# Patient Record
Sex: Female | Born: 1945 | Race: Black or African American | Hispanic: No | Marital: Single | Smoking: Current some day smoker
Health system: Southern US, Community
[De-identification: ages and names within clinical notes are randomized; demographics above are authoritative.]

## PROBLEM LIST (undated history)

## (undated) DIAGNOSIS — I10 Essential (primary) hypertension: Secondary | ICD-10-CM

## (undated) DIAGNOSIS — Z720 Tobacco use: Secondary | ICD-10-CM

## (undated) DIAGNOSIS — I2510 Atherosclerotic heart disease of native coronary artery without angina pectoris: Secondary | ICD-10-CM

## (undated) DIAGNOSIS — I5022 Chronic systolic (congestive) heart failure: Secondary | ICD-10-CM

## (undated) DIAGNOSIS — I255 Ischemic cardiomyopathy: Secondary | ICD-10-CM

## (undated) DIAGNOSIS — I340 Nonrheumatic mitral (valve) insufficiency: Secondary | ICD-10-CM

## (undated) DIAGNOSIS — I447 Left bundle-branch block, unspecified: Secondary | ICD-10-CM

## (undated) DIAGNOSIS — I4901 Ventricular fibrillation: Secondary | ICD-10-CM

## (undated) HISTORY — DX: Essential (primary) hypertension: I10

## (undated) HISTORY — PX: TUBAL LIGATION: SHX77

## (undated) HISTORY — DX: Chronic systolic (congestive) heart failure: I50.22

## (undated) HISTORY — DX: Atherosclerotic heart disease of native coronary artery without angina pectoris: I25.10

## (undated) HISTORY — DX: Tobacco use: Z72.0

## (undated) HISTORY — DX: Nonrheumatic mitral (valve) insufficiency: I34.0

## (undated) HISTORY — DX: Left bundle-branch block, unspecified: I44.7

## (undated) HISTORY — DX: Ischemic cardiomyopathy: I25.5

## (undated) HISTORY — PX: TONSILLECTOMY AND ADENOIDECTOMY: SUR1326

---

## 2008-05-27 HISTORY — PX: CARDIAC CATHETERIZATION: SHX172

## 2009-06-14 HISTORY — PX: CARDIAC DEFIBRILLATOR PLACEMENT: SHX171

## 2011-02-11 ENCOUNTER — Encounter: Payer: Self-pay | Admitting: *Deleted

## 2011-02-11 ENCOUNTER — Ambulatory Visit (INDEPENDENT_AMBULATORY_CARE_PROVIDER_SITE_OTHER): Payer: Self-pay | Admitting: Cardiology

## 2011-02-11 ENCOUNTER — Encounter: Payer: Self-pay | Admitting: Cardiology

## 2011-02-11 VITALS — BP 110/77 | HR 69 | Ht 63.0 in | Wt 162.0 lb

## 2011-02-11 DIAGNOSIS — I5022 Chronic systolic (congestive) heart failure: Secondary | ICD-10-CM | POA: Insufficient documentation

## 2011-02-11 DIAGNOSIS — I509 Heart failure, unspecified: Secondary | ICD-10-CM

## 2011-02-11 DIAGNOSIS — E785 Hyperlipidemia, unspecified: Secondary | ICD-10-CM | POA: Insufficient documentation

## 2011-02-11 DIAGNOSIS — Z720 Tobacco use: Secondary | ICD-10-CM

## 2011-02-11 DIAGNOSIS — I2510 Atherosclerotic heart disease of native coronary artery without angina pectoris: Secondary | ICD-10-CM | POA: Insufficient documentation

## 2011-02-11 DIAGNOSIS — I10 Essential (primary) hypertension: Secondary | ICD-10-CM | POA: Insufficient documentation

## 2011-02-11 DIAGNOSIS — F17200 Nicotine dependence, unspecified, uncomplicated: Secondary | ICD-10-CM

## 2011-02-11 NOTE — Progress Notes (Signed)
HPI The patient presents for evaluation of CAD and ischemic cardiomyopathy.  She is new to this office.  He has a history of myocardial infarction with stent placement at [place] in 2010. This was in August. Apparently in December she was hospitalized again for congestive heart failure. She subsequently had an ICD in 2011. She reports an ejection fraction of 35% with her last echo being this year. I have none of the office records.  She is relocating to this area. She was active participating in cardiac rehabilitation. The patient denies any new symptoms such as chest discomfort, neck or arm discomfort. There has been no new shortness of breath, PND or orthopnea. There have been no reported palpitations, presyncope or syncope.  Allergies  Allergen Reactions  . Penicillins     Current Outpatient Prescriptions  Medication Sig Dispense Refill  . aspirin 81 MG tablet Take 81 mg by mouth daily.        . clopidogrel (PLAVIX) 75 MG tablet Take 75 mg by mouth daily.        . famotidine (PEPCID) 20 MG tablet Take 20 mg by mouth daily.        Marland Kitchen losartan (COZAAR) 50 MG tablet Take 50 mg by mouth daily.        . metoprolol succinate (TOPROL-XL) 25 MG 24 hr tablet Take 25 mg by mouth daily.        . simvastatin (ZOCOR) 40 MG tablet Take 40 mg by mouth at bedtime.        Marland Kitchen spironolactone (ALDACTONE) 25 MG tablet Take 25 mg by mouth daily.          Past Medical History  Diagnosis Date  . CHF (congestive heart failure)   . Hypertension   . Coronary atherosclerosis of unspecified type of bypass graft   . Coronary artery disease     stent placement 2010    Past Surgical History  Procedure Date  . Insert / replace / remove pacemaker 2011  . Cardiac defibrillator placement 2011    Family History  Problem Relation Age of Onset  . Diabetes type II Mother   . Heart attack Brother     cardiac arrest  . Seizures Sister   . Other Father     accident  . Other Brother     pacemaker    History    Social History  . Marital Status: Single    Spouse Name: N/A    Number of Children: N/A  . Years of Education: N/A   Occupational History  . RETIRED     retired bus Office manager office   Social History Main Topics  . Smoking status: Current Some Day Smoker -- 2.0 packs/day for 40 years    Types: Cigarettes  . Smokeless tobacco: Never Used   Comment: smokes socially/ smokes 2 cigarettes per month  . Alcohol Use: Yes     occasional alcohol use  . Drug Use: No  . Sexually Active: Not on file   Other Topics Concern  . Not on file   Social History Narrative  . No narrative on file    ROS:  As stated in the HPI and negative for all other systems.   PHYSICAL EXAM BP 110/77  Pulse 69  Ht 5\' 3"  (1.6 m)  Wt 162 lb (73.483 kg)  BMI 28.70 kg/m2 GENERAL:  Well appearing HEENT:  Pupils equal round and reactive, fundi not visualized, oral mucosa unremarkable NECK:  No jugular venous distention, waveform  within normal limits, carotid upstroke brisk and symmetric, no bruits, no thyromegaly LYMPHATICS:  No cervical, inguinal adenopathy LUNGS:  Clear to auscultation bilaterally BACK:  No CVA tenderness CHEST:  ICD pocket HEART:  PMI not displaced or sustained,S1 and S2 within normal limits, no S3, no S4, no clicks, no rubs, no murmurs ABD:  Flat, positive bowel sounds normal in frequency in pitch, no bruits, no rebound, no guarding, no midline pulsatile mass, no hepatomegaly, no splenomegaly EXT:  2 plus pulses upper and diminished lower, no edema, no cyanosis no clubbing SKIN:  No rashes no nodules NEURO:  Cranial nerves II through XII grossly intact, motor grossly intact throughout PSYCH:  Cognitively intact, oriented to person place and time  EKG:    AV paced rhythm  ASSESSMENT AND PLAN

## 2011-02-11 NOTE — Assessment & Plan Note (Signed)
She seems to be euvolemic.  At this point, no change in therapy is indicated.  We have reviewed salt and fluid restrictions.  No further cardiovascular testing is indicated.  I will arrange for her to follow in our device clinic.

## 2011-02-11 NOTE — Patient Instructions (Signed)
Your physician wants you to follow-up in: 4 months. You will receive a reminder letter in the mail one-two months in advance. If you don't receive a letter, please call our office to schedule the follow-up appointment. Referral to Cardiac Rehab Referral to EP at [facility]. Office. We will obtain your prior cardiac records.

## 2011-02-11 NOTE — Assessment & Plan Note (Signed)
I will defer to VYAS,DHRUV B., MD, MD.  She had her lipids checked recently but I don't have these results.  I would suggest an aggressive goal of LDL less than 70 and HDL greater than 50.

## 2011-02-11 NOTE — Assessment & Plan Note (Signed)
At this point she has no new symptoms.  She will continue with risk reduction and I will obtain the old records.

## 2011-02-11 NOTE — Assessment & Plan Note (Signed)
We had a long discussion about the need to avoid all cigarettes.

## 2011-02-11 NOTE — Assessment & Plan Note (Signed)
The blood pressure is at target. No change in medications is indicated. We will continue with therapeutic lifestyle changes (TLC).  

## 2011-03-25 ENCOUNTER — Ambulatory Visit: Payer: Self-pay | Admitting: Internal Medicine

## 2011-04-11 ENCOUNTER — Encounter: Payer: Self-pay | Admitting: Internal Medicine

## 2011-04-11 ENCOUNTER — Ambulatory Visit (INDEPENDENT_AMBULATORY_CARE_PROVIDER_SITE_OTHER): Payer: Medicare HMO | Admitting: Internal Medicine

## 2011-04-11 DIAGNOSIS — I509 Heart failure, unspecified: Secondary | ICD-10-CM

## 2011-04-11 DIAGNOSIS — Z720 Tobacco use: Secondary | ICD-10-CM

## 2011-04-11 DIAGNOSIS — I428 Other cardiomyopathies: Secondary | ICD-10-CM

## 2011-04-11 DIAGNOSIS — F17200 Nicotine dependence, unspecified, uncomplicated: Secondary | ICD-10-CM

## 2011-04-11 DIAGNOSIS — I2510 Atherosclerotic heart disease of native coronary artery without angina pectoris: Secondary | ICD-10-CM

## 2011-04-11 DIAGNOSIS — I5022 Chronic systolic (congestive) heart failure: Secondary | ICD-10-CM

## 2011-04-11 DIAGNOSIS — I10 Essential (primary) hypertension: Secondary | ICD-10-CM

## 2011-04-11 NOTE — Progress Notes (Signed)
Tracy Mills is a pleasant 65 y.o. patient with a h/o CAD, ischemic CM, sp ICD implantation (MDT) in [place] who presents today to establish care in the Electrophysiology device clinic.  She reports having MI 2010 for which she underwent PCI.  She developed subsequent CHF.  She underwent ICD implantation 2011.  The patient reports doing very well recently and remains very active despite her age.  She denies problems with her ICD and has not received ICD shocks.  Today, she  denies symptoms of palpitations, chest pain, shortness of breath, orthopnea, PND, lower extremity edema, dizziness, presyncope, syncope, or neurologic sequela.  The patientis tolerating medications without difficulties and is otherwise without complaint today.   Past Medical History  Diagnosis Date  . Chronic systolic congestive heart failure   . Hypertension   . Coronary artery disease     stent placement 2010  . Tobacco abuse     Past Surgical History  Procedure Date  . Cardiac defibrillator placement 2011  . Tonsillectomy and adenoidectomy   . Tubal ligation     History   Social History  . Marital Status: Single    Spouse Name: N/A    Number of Children: N/A  . Years of Education: N/A   Occupational History  . RETIRED     retired bus Office manager office   Social History Main Topics  . Smoking status: Current Some Day Smoker -- 0.2 packs/day for 40 years    Types: Cigarettes  . Smokeless tobacco: Never Used   Comment: smokes socially/ smokes 2 cigarettes per month, trying to quit  . Alcohol Use: Yes     occasional alcohol use  . Drug Use: No  . Sexually Active: Not on file   Other Topics Concern  . Not on file   Social History Narrative   Moved to [place] from [place].  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 Diabetes type II Mother   . Heart attack Brother 51    cardiac arrest  .  Seizures Sister   . Other Father     accident  . Other Brother     pacemaker    Allergies  Allergen Reactions  . Penicillins     Current Outpatient Prescriptions  Medication Sig Dispense Refill  . aspirin 81 MG tablet Take 81 mg by mouth daily.        . clopidogrel (PLAVIX) 75 MG tablet Take 75 mg by mouth daily.        Marland Kitchen losartan (COZAAR) 50 MG tablet Take 50 mg by mouth daily.        . metoprolol succinate (TOPROL-XL) 25 MG 24 hr tablet Take 25 mg by mouth daily.        . simvastatin (ZOCOR) 40 MG tablet Take 40 mg by mouth at bedtime.        Marland Kitchen spironolactone (ALDACTONE) 25 MG tablet Take 25 mg by mouth daily.          ROS- all systems are reviewed and negative except as per HPI  Physical Exam: Filed Vitals:   04/11/11 1356 04/11/11 1359  BP: 129/86 127/87  Pulse: 67 69  Height: 5\' 3"  (1.6 m)   Weight: 162 lb (73.483 kg)     GEN- The patient is overweight appearing, alert and oriented x 3 today.   Head- normocephalic, atraumatic Eyes-  Sclera clear, conjunctiva pink Ears- hearing intact Oropharynx-  clear Neck- supple, no JVP Lymph- no cervical lymphadenopathy Lungs- Clear to ausculation bilaterally, normal work of breathing Chest- ICD pocket is well healed Heart- Regular rate and rhythm, no murmurs, rubs or gallops, PMI not laterally displaced GI- soft, NT, ND, + BS Extremities- no clubbing, cyanosis, or edema MS- no significant deformity or atrophy Skin- no rash or lesion Psych- euthymic mood, full affect Neuro- strength and sensation are intact  ICD interrogation- reviewed in detail today,  See PACEART report  Assessment and Plan:

## 2011-04-11 NOTE — Assessment & Plan Note (Signed)
Stable No change required today  

## 2011-04-11 NOTE — Assessment & Plan Note (Signed)
Cessation advised She is trying to quit

## 2011-04-11 NOTE — Assessment & Plan Note (Addendum)
Normal ICD function See Pace Art report VT zone increased to 170 bpm, VF zone increased to 200bpm (NID 30/40)  carelink every 3 months Return in 12 months

## 2011-05-28 DIAGNOSIS — Z8679 Personal history of other diseases of the circulatory system: Secondary | ICD-10-CM

## 2011-05-28 HISTORY — DX: Personal history of other diseases of the circulatory system: Z86.79

## 2011-06-24 ENCOUNTER — Ambulatory Visit (INDEPENDENT_AMBULATORY_CARE_PROVIDER_SITE_OTHER): Payer: Medicare HMO | Admitting: Cardiovascular Disease

## 2011-06-24 ENCOUNTER — Encounter: Payer: Self-pay | Admitting: Cardiovascular Disease

## 2011-06-24 DIAGNOSIS — I5022 Chronic systolic (congestive) heart failure: Secondary | ICD-10-CM

## 2011-06-24 DIAGNOSIS — I5020 Unspecified systolic (congestive) heart failure: Secondary | ICD-10-CM

## 2011-06-24 DIAGNOSIS — I2510 Atherosclerotic heart disease of native coronary artery without angina pectoris: Secondary | ICD-10-CM

## 2011-06-24 DIAGNOSIS — I340 Nonrheumatic mitral (valve) insufficiency: Secondary | ICD-10-CM

## 2011-06-24 DIAGNOSIS — I509 Heart failure, unspecified: Secondary | ICD-10-CM

## 2011-06-24 DIAGNOSIS — I059 Rheumatic mitral valve disease, unspecified: Secondary | ICD-10-CM

## 2011-06-24 MED ORDER — CLOPIDOGREL BISULFATE 75 MG PO TABS: 75.0000 mg | ORAL_TABLET | Freq: Every day | ORAL | Status: DC

## 2011-06-24 MED ORDER — LOSARTAN POTASSIUM 50 MG PO TABS: 50.0000 mg | ORAL_TABLET | Freq: Every day | ORAL | Status: DC

## 2011-06-24 MED ORDER — METOPROLOL SUCCINATE ER 25 MG PO TB24: 25.0000 mg | ORAL_TABLET | Freq: Every day | ORAL | Status: DC

## 2011-06-24 MED ORDER — SPIRONOLACTONE 25 MG PO TABS: 25.0000 mg | ORAL_TABLET | Freq: Every day | ORAL | Status: DC

## 2011-06-24 MED ORDER — SIMVASTATIN 40 MG PO TABS: 40.0000 mg | ORAL_TABLET | Freq: Every day | ORAL | Status: DC

## 2011-06-24 NOTE — Patient Instructions (Signed)
Your physician wants you to follow-up in: 6 months. You will receive a reminder letter in the mail one-two months in advance. If you don't receive a letter, please call our office to schedule the follow-up appointment. Your physician recommends that you continue on your current medications as directed. Please refer to the Current Medication list given to you today. Your physician has requested that you have an echocardiogram. Echocardiography is a painless test that uses sound waves to create images of your heart. It provides your doctor with information about the size and shape of your heart and how well your heart's chambers and valves are working. This procedure takes approximately one hour. There are no restrictions for this procedure. If the results of your test are normal or stable, you will receive a letter. If they are abnormal, the nurse will contact you by phone.  

## 2011-06-24 NOTE — Progress Notes (Signed)
HPI  This is a 66 year old female who is here today for a followup visit. She has history of anterior myocardial infarction in 2010 status post LAD PCI and stent placement. She has chronic systolic heart failure with severely reduced LV systolic function. She is status post ICD placement. Most recent echocardiogram was in January of 2012 which showed an ejection fraction of 20% with moderate to severe mitral regurgitation. She had a stress test done in June of 2012 which showed a large anterior infarct without significant ischemia. Overall, she feels reasonably well. She denies any chest pain. She has mild exertional dyspnea which has not worsened. She denies any orthopnea or PND. She has not been taking spironolactone regularly.  Allergies  Allergen Reactions  . Penicillins      Current Outpatient Prescriptions on File Prior to Visit  Medication Sig Dispense Refill  . aspirin 81 MG tablet Take 81 mg by mouth daily.        . clopidogrel (PLAVIX) 75 MG tablet Take 75 mg by mouth daily.        Marland Kitchen losartan (COZAAR) 50 MG tablet Take 50 mg by mouth daily.        . metoprolol succinate (TOPROL-XL) 25 MG 24 hr tablet Take 25 mg by mouth daily.        . simvastatin (ZOCOR) 40 MG tablet Take 40 mg by mouth at bedtime.        Marland Kitchen spironolactone (ALDACTONE) 25 MG tablet Take 25 mg by mouth daily.           Past Medical History  Diagnosis Date  . Chronic systolic congestive heart failure   . Hypertension   . Tobacco abuse   . Coronary artery disease     anterior MI in 2010. LAD stent placement 2010. EF: 15%  . MR (mitral regurgitation)     moderate to severe     Past Surgical History  Procedure Date  . Cardiac defibrillator placement 2011  . Tonsillectomy and adenoidectomy   . Tubal ligation   . Cardiac catheterization 2010    LAD PCI and stent placement     Family History  Problem Relation Age of Onset  . Diabetes type II Mother   . Heart attack Brother 51    cardiac arrest  .  Seizures Sister   . Other Father     accident  . Other Brother     pacemaker     History   Social History  . Marital Status: Single    Spouse Name: N/A    Number of Children: N/A  . Years of Education: N/A   Occupational History  . RETIRED     retired bus Office manager office   Social History Main Topics  . Smoking status: Current Some Day Smoker -- 0.2 packs/day for 40 years    Types: Cigarettes  . Smokeless tobacco: Never Used   Comment: smokes socially/ smokes 2 cigarettes per month, trying to quit  . Alcohol Use: Yes     occasional alcohol use  . Drug Use: No  . Sexually Active: Not on file   Other Topics Concern  . Not on file   Social History Narrative   Moved to [place] from [place].  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 She appears well-developed and well-nourished. No distress.  HENT: No nasal discharge.  Head: Normocephalic and atraumatic.  Eyes: Pupils are equal, round, and reactive to light. Right eye exhibits no discharge. Left eye exhibits no discharge.  Neck: Normal range of motion. Neck supple. No JVD present. No thyromegaly present.  Cardiovascular: Normal rate, regular rhythm, normal heart sounds. Exam reveals no gallop and no friction rub.  No murmur heard.  Pulmonary/Chest: Effort normal and breath sounds normal. No stridor. No respiratory distress. She has no wheezes. She has no rales. She exhibits no tenderness.  Abdominal: Soft. Bowel sounds are normal. She exhibits no distension. There is no tenderness. There is no rebound and no guarding.  Musculoskeletal: Normal range of motion. She exhibits no edema and no tenderness.  Neurological: She is alert and oriented to person, place, and time. Coordination normal.  Skin: Skin is warm  and dry. No rash noted. She is not diaphoretic. No erythema. No pallor.  Psychiatric: She has a normal mood and affect. Her behavior is normal. Judgment and thought content normal.     ASSESSMENT AND PLAN

## 2011-06-25 ENCOUNTER — Encounter: Payer: Self-pay | Admitting: Cardiovascular Disease

## 2011-06-25 NOTE — Assessment & Plan Note (Signed)
This will be evaluated with the echocardiogram.

## 2011-06-25 NOTE — Assessment & Plan Note (Signed)
She seems to be euvolemic at this time. I advised her to take spironolactone regularly to improve LV systolic function. She gets her routine labs done with Dr. Sherril Croon. I do recommend basic metabolic profile every 6 months. She is currently New York Heart Association class II-III.  I recommend repeat echocardiogram to reevaluate her LV systolic function and degree of mitral regurgitation. If her EF is still significantly depressed, I recommend switching Toprol to carvedilol.

## 2011-06-25 NOTE — Assessment & Plan Note (Signed)
The patient is not having any recurrent chest pain. Most recent stress test was in June of 2012 which showed no significant ischemia although there was a large infarct. I recommend continuing dual antiplatelet therapy for now. Plavix is optional at this time but given her severely reduced LV systolic function, I favor that she stays on this longer than the one year mandatory period.

## 2011-07-03 ENCOUNTER — Other Ambulatory Visit (INDEPENDENT_AMBULATORY_CARE_PROVIDER_SITE_OTHER): Payer: Medicare HMO | Admitting: *Deleted

## 2011-07-03 DIAGNOSIS — I059 Rheumatic mitral valve disease, unspecified: Secondary | ICD-10-CM

## 2011-07-03 DIAGNOSIS — I2589 Other forms of chronic ischemic heart disease: Secondary | ICD-10-CM

## 2011-07-03 DIAGNOSIS — I2510 Atherosclerotic heart disease of native coronary artery without angina pectoris: Secondary | ICD-10-CM

## 2011-07-04 ENCOUNTER — Telehealth: Payer: Self-pay | Admitting: *Deleted

## 2011-07-04 MED ORDER — CARVEDILOL 3.125 MG PO TABS: 3.1250 mg | ORAL_TABLET | Freq: Two times a day (BID) | ORAL | Status: DC

## 2011-07-04 NOTE — Telephone Encounter (Signed)
Message copied by Eustace Moore on Thu Jul 04, 2011  2:12 PM ------      Message from: Lorine Bears A      Created: Thu Jul 04, 2011 12:49 PM       Inform patient that echo showed that her heart function is still very weak. Stop Toprol. Start Coreg 3.125 mg bid #60 with 3 refills. Follow up in 1 month to see if we can gradually increase the dose.

## 2011-07-04 NOTE — Telephone Encounter (Signed)
Patient informed and verbalized understanding of plan. New prescription for coreg sent to [facility].

## 2011-07-11 ENCOUNTER — Ambulatory Visit (INDEPENDENT_AMBULATORY_CARE_PROVIDER_SITE_OTHER): Payer: Medicare HMO | Admitting: *Deleted

## 2011-07-11 ENCOUNTER — Encounter: Payer: Self-pay | Admitting: Internal Medicine

## 2011-07-11 DIAGNOSIS — I509 Heart failure, unspecified: Secondary | ICD-10-CM

## 2011-07-11 DIAGNOSIS — I5022 Chronic systolic (congestive) heart failure: Secondary | ICD-10-CM

## 2011-07-11 DIAGNOSIS — I428 Other cardiomyopathies: Secondary | ICD-10-CM

## 2011-07-14 LAB — REMOTE ICD DEVICE
AL AMPLITUDE: 1.8 mv
AL THRESHOLD: 0.5 V
BATTERY VOLTAGE: 3.1125 V
CHARGE TIME: 8.948 s
DEV-0020ICD: NEGATIVE
RV LEAD AMPLITUDE: 20 mv
RV LEAD IMPEDENCE ICD: 627 Ohm
TOT-0001: 1
TOT-0002: 0
TZAT-0001FASTVT: 1
TZAT-0001SLOWVT: 1
TZAT-0001SLOWVT: 2
TZAT-0002ATACH: NEGATIVE
TZAT-0002FASTVT: NEGATIVE
TZAT-0004SLOWVT: 8
TZAT-0004SLOWVT: 8
TZAT-0011ATACH: 10 ms
TZAT-0012ATACH: 150 ms
TZAT-0012FASTVT: 200 ms
TZAT-0013SLOWVT: 3
TZAT-0013SLOWVT: 3
TZAT-0018FASTVT: NEGATIVE
TZAT-0019ATACH: 6 V
TZAT-0019ATACH: 6 V
TZAT-0019FASTVT: 8 V
TZAT-0020ATACH: 1.5 ms
TZAT-0020ATACH: 1.5 ms
TZAT-0020ATACH: 1.5 ms
TZAT-0020SLOWVT: 1.5 ms
TZAT-0020SLOWVT: 1.5 ms
TZON-0003VSLOWVT: 400 ms
TZON-0004SLOWVT: 28
TZON-0004VSLOWVT: 36
TZST-0001ATACH: 5
TZST-0001FASTVT: 4
TZST-0001FASTVT: 5
TZST-0001FASTVT: 6
TZST-0001SLOWVT: 3
TZST-0001SLOWVT: 5
TZST-0002ATACH: NEGATIVE
TZST-0002ATACH: NEGATIVE
TZST-0002FASTVT: NEGATIVE
TZST-0002FASTVT: NEGATIVE
TZST-0003SLOWVT: 35 J

## 2011-07-16 ENCOUNTER — Encounter: Payer: Self-pay | Admitting: *Deleted

## 2011-07-19 NOTE — Progress Notes (Signed)
ICD remote with ICM 

## 2011-08-01 ENCOUNTER — Ambulatory Visit: Payer: Medicare HMO | Admitting: Cardiovascular Disease

## 2011-08-02 ENCOUNTER — Ambulatory Visit (INDEPENDENT_AMBULATORY_CARE_PROVIDER_SITE_OTHER): Payer: Medicare HMO | Admitting: Cardiovascular Disease

## 2011-08-02 ENCOUNTER — Encounter: Payer: Self-pay | Admitting: Cardiovascular Disease

## 2011-08-02 DIAGNOSIS — I5022 Chronic systolic (congestive) heart failure: Secondary | ICD-10-CM

## 2011-08-02 DIAGNOSIS — I2510 Atherosclerotic heart disease of native coronary artery without angina pectoris: Secondary | ICD-10-CM

## 2011-08-02 DIAGNOSIS — Z720 Tobacco use: Secondary | ICD-10-CM

## 2011-08-02 DIAGNOSIS — I340 Nonrheumatic mitral (valve) insufficiency: Secondary | ICD-10-CM

## 2011-08-02 DIAGNOSIS — I509 Heart failure, unspecified: Secondary | ICD-10-CM

## 2011-08-02 DIAGNOSIS — I059 Rheumatic mitral valve disease, unspecified: Secondary | ICD-10-CM

## 2011-08-02 DIAGNOSIS — F17200 Nicotine dependence, unspecified, uncomplicated: Secondary | ICD-10-CM

## 2011-08-02 MED ORDER — CARVEDILOL 6.25 MG PO TABS: 6.2500 mg | ORAL_TABLET | Freq: Two times a day (BID) | ORAL | Status: DC

## 2011-08-02 NOTE — Progress Notes (Signed)
HPI  This is a 66 year old female who is here today for a followup visit. She has history of anterior myocardial infarction in 2010 status post LAD PCI and stent placement. She has chronic systolic heart failure with severely reduced LV systolic function. She is status post ICD placement. She had a stress test done in June of 2012 which showed a large anterior infarct without significant ischemia. Overall, she feels reasonably well. She denies any chest pain. She has mild exertional dyspnea which has not worsened. She denies any orthopnea or PND. I obtain an echocardiogram last month which basically showed no significant improvement in her LV systolic function. Ejection fraction was 15-20% with moderate to severe mitral regurgitation. I elected to switch her from Toprol to carvedilol 3.125 mg twice daily. I also instructed her to take spironolactone on a regular basis and not just as needed.   Allergies  Allergen Reactions  . Penicillins   . Tetracyclines & Related      Current Outpatient Prescriptions on File Prior to Visit  Medication Sig Dispense Refill  . aspirin 81 MG tablet Take 81 mg by mouth daily.        . clopidogrel (PLAVIX) 75 MG tablet Take 1 tablet (75 mg total) by mouth daily.  30 tablet  6  . losartan (COZAAR) 50 MG tablet Take 1 tablet (50 mg total) by mouth daily.  30 tablet  6  . nitroGLYCERIN (NITROSTAT) 0.4 MG SL tablet Place 0.4 mg under the tongue every 5 (five) minutes as needed.      . simvastatin (ZOCOR) 40 MG tablet Take 1 tablet (40 mg total) by mouth at bedtime.  30 tablet  6  . spironolactone (ALDACTONE) 25 MG tablet Take 1 tablet (25 mg total) by mouth daily.  30 tablet  6     Past Medical History  Diagnosis Date  . Chronic systolic congestive heart failure   . Hypertension   . Tobacco abuse   . Coronary artery disease     anterior MI in 2010. LAD stent placement 2010. EF: 15%  . MR (mitral regurgitation)     moderate to severe     Past Surgical  History  Procedure Date  . Cardiac defibrillator placement 2011  . Tonsillectomy and adenoidectomy   . Tubal ligation   . Cardiac catheterization 2010    LAD PCI and stent placement     Family History  Problem Relation Age of Onset  . Diabetes type II Mother   . Heart attack Brother 51    cardiac arrest  . Seizures Sister   . Other Father     accident  . Other Brother     pacemaker     History   Social History  . Marital Status: Single    Spouse Name: N/A    Number of Children: N/A  . Years of Education: N/A   Occupational History  . RETIRED     retired bus Office manager office   Social History Main Topics  . Smoking status: Current Some Day Smoker -- 0.2 packs/day for 40 years    Types: Cigarettes  . Smokeless tobacco: Never Used   Comment: smokes socially/ smokes 2 cigarettes per month, trying to quit  . Alcohol Use: Yes     occasional alcohol use  . Drug Use: No  . Sexually Active: Not on file   Other Topics Concern  . Not on file   Social History Narrative   Moved to  [place] from [place].  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 She appears well-developed and well-nourished. No distress.  HENT: No nasal discharge.  Head: Normocephalic and atraumatic.  Eyes: Pupils are equal and round. Right eye exhibits no discharge. Left eye exhibits no discharge.  Neck: Normal range of motion. Neck supple. No JVD present. No thyromegaly present.  Cardiovascular: Normal rate, regular rhythm, normal heart sounds. Exam reveals no gallop and no friction rub. No murmur heard.  Pulmonary/Chest: Effort normal and breath sounds normal. No stridor. No respiratory distress. She has no wheezes. She has no rales. She exhibits no tenderness.  Abdominal: Soft. Bowel  sounds are normal. She exhibits no distension. There is no tenderness. There is no rebound and no guarding.  Musculoskeletal: Normal range of motion. She exhibits no edema and no tenderness.  Neurological: She is alert and oriented to person, place, and time. Coordination normal.  Skin: Skin is warm and dry. No rash noted. She is not diaphoretic. No erythema. No pallor.  Psychiatric: She has a normal mood and affect. Her behavior is normal. Judgment and thought content normal.      ASSESSMENT AND PLAN

## 2011-08-02 NOTE — Patient Instructions (Addendum)
   Follow up in 2 months.  Increase Coreg (carvedilol) to 6.25 mg two times a day. You may take 2 of your 3.125 mg tablets two times a day until gone, and then get new prescription filled for 6.25 mg tablets. A new prescription was sent to your pharmacy to reflect this change.

## 2011-08-02 NOTE — Assessment & Plan Note (Signed)
The patient is working hard to quit smoking. According to her she only smoked a few cigarettes over the last month.

## 2011-08-02 NOTE — Assessment & Plan Note (Signed)
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 No indication for mitral valve repair. I am hoping that this would improve if her LV remodeling improves. Also there are reports that CRT can actually improve the degree of regurgitation in the setting of severe LV systolic dysfunction and left ventricular dyssynchrony.

## 2011-08-02 NOTE — Assessment & Plan Note (Signed)
No recurrent angina. Continue medical therapy.  

## 2011-08-02 NOTE — Assessment & Plan Note (Signed)
She seems to be euvolemic at this time.  She is currently New York Heart Association class II-III.  Repeat echocardiogram recently showed persistent severe LV systolic dysfunction with an ejection fraction of 15-20%. This is not a good sign. Clinically she seems to be stable. She has been switched to Coreg since then and she seems to be tolerating it.  Today, I will go ahead and increase the dose to 6.25 mg twice daily. The plan is to try to up titrate this medication as much as tolerated by her blood pressure. This will be reevaluated in 2 months. Continue current heart failure therapy. She has not required diuretic. The patient is interested in stem cell therapy for heart failure. I am not aware of such trials. Most of stem cell therapy and heart failure is in the setting of acute infarct and usually within 7-14 days of the acute event. I will check with Dr. Gala Romney to see if there are any available trials locally. I think the other option we have to evaluate is upgrading his ICD to an ICD CRT device given that she has underlying left bundle branch block. I will be checking that with Dr. Johney Frame.

## 2011-08-05 ENCOUNTER — Telehealth: Payer: Self-pay | Admitting: *Deleted

## 2011-08-05 NOTE — Telephone Encounter (Signed)
Message copied by Arlyss Gandy on Mon Aug 05, 2011  2:46 PM ------      Message from: Lorine Bears A      Created: Mon Aug 05, 2011  1:10 PM       See below. Schedule her for a visit with Dr. Johney Frame here in [place].             ----- Message -----         From: Hillis Range, MD         Sent: 08/04/2011   8:27 PM           To: Iran Ouch, MD            She has a wide left bundle and does seem to be a reasonable candidate for CRT.  Her device was implanted 2011 in [place].  Have the nurses schedule her to see me again in the [place] office and we can discuss upgrade.            Thanks      james      ----- Message -----         From: Iran Ouch, MD         Sent: 08/02/2011   3:38 PM           To: Hillis Range, MD            Dan,       Are you aware of any stem-cell trials for heart failure. This patient is interested.       Fayrene Fearing,       What do you think about upgrading her ICD to ICD/CRT?

## 2011-08-05 NOTE — Telephone Encounter (Signed)
First available appt with Dr. Johney Frame in [place] is not until June. Spoke with Dr. Kirke Corin who recommends pt see Dr. Johney Frame in [place] if sooner appt available. Pt is agreeable w/this plan. Pt will see Dr. Johney Frame on 4/11 at 11:30. She is aware of this appt.

## 2011-09-05 ENCOUNTER — Encounter: Payer: Self-pay | Admitting: Internal Medicine

## 2011-09-05 ENCOUNTER — Ambulatory Visit (INDEPENDENT_AMBULATORY_CARE_PROVIDER_SITE_OTHER): Payer: Medicare HMO | Admitting: Internal Medicine

## 2011-09-05 VITALS — BP 103/69 | HR 64 | Resp 18 | Ht 62.0 in | Wt 158.0 lb

## 2011-09-05 DIAGNOSIS — I059 Rheumatic mitral valve disease, unspecified: Secondary | ICD-10-CM

## 2011-09-05 DIAGNOSIS — I509 Heart failure, unspecified: Secondary | ICD-10-CM

## 2011-09-05 DIAGNOSIS — I10 Essential (primary) hypertension: Secondary | ICD-10-CM

## 2011-09-05 DIAGNOSIS — I2510 Atherosclerotic heart disease of native coronary artery without angina pectoris: Secondary | ICD-10-CM

## 2011-09-05 DIAGNOSIS — I340 Nonrheumatic mitral (valve) insufficiency: Secondary | ICD-10-CM

## 2011-09-05 DIAGNOSIS — I5022 Chronic systolic (congestive) heart failure: Secondary | ICD-10-CM

## 2011-09-05 NOTE — Patient Instructions (Signed)
Your physician wants you to follow-up in:  Nov 2013 in [place] with Dr Johney Frame Bonita Quin will receive a reminder letter in the mail two months in advance. If you don't receive a letter, please call our office to schedule the follow-up appointment.  Remote monitoring is used to monitor your Pacemaker of ICD from home. This monitoring reduces the number of office visits required to check your device to one time per year. It allows [place] to keep an eye on the functioning of your device to ensure it is working properly. You are scheduled for a device check from home on 10/10/2011. You may send your transmission at any time that day. If you have a wireless device, the transmission will be sent automatically. After your physician reviews your transmission, you will receive a postcard with your next transmission date.

## 2011-09-05 NOTE — Assessment & Plan Note (Signed)
Stable No change required today  

## 2011-09-05 NOTE — Assessment & Plan Note (Signed)
The patient has an ischemic CM (EF 20%), NYHA Class II/III CHF, and LBBB.   She is referred today by Dr Kirke Corin for consideration of upgrade of her ICD to a BiV ICD.  Given her QRS >131msec with LBBB and NYHA Class III CHF, she has a class I indication for upgrade to CRT. Risks, benefits, alternatives to ICD upgrade to a biv ICD system were discussed in detail with the patient today. The patient  understands that the risks include but are not limited to bleeding, infection, pneumothorax, perforation, tamponade, vascular damage, renal failure, MI, stroke, death,and lead dislodgement. At this time, she is not ready to proceed with upgrade.  She wishes to think about this further.  She will contact my office should she decide to proceed with device upgrade in the future. I think that data has shown that this is a very good option and have strongly encouraged her to consider this. She will continue her current device follow-up in the interim.

## 2011-09-05 NOTE — Assessment & Plan Note (Signed)
I agree with Dr Kirke Corin that this could improve with CRT.

## 2011-09-05 NOTE — Assessment & Plan Note (Signed)
No ischemic symptoms 

## 2011-09-05 NOTE — Progress Notes (Signed)
Tracy Mills is a pleasant 66 y.o. patient with a h/o CAD, ischemic CM, sp ICD implantation (MDT) in [place] who presents today to establish care in the Electrophysiology device clinic.  She reports having MI 2010 for which she underwent PCI.  She developed subsequent CHF.  She underwent ICD implantation 2011.  The patient reports doing very well recently and remains very active despite her age.  She has NYHA Class II/III CHF.  She reports dyspnea with moderate activity as well as occasional PND.  She denies problems with her ICD and has not received ICD shocks.  Today, she  denies symptoms of palpitations, chest pain, shortness of breath, orthopnea, PND, lower extremity edema, dizziness, presyncope, syncope, or neurologic sequela.  The patientis tolerating medications without difficulties and is otherwise without complaint today.   Past Medical History  Diagnosis Date  . Chronic systolic congestive heart failure   . Hypertension   . Tobacco abuse   . Coronary artery disease     anterior MI in 2010. LAD stent placement 2010. EF: 15%  . MR (mitral regurgitation)     moderate to severe  . LBBB (left bundle branch block)     Past Surgical History  Procedure Date  . Cardiac defibrillator placement 2011  . Tonsillectomy and adenoidectomy   . Tubal ligation   . Cardiac catheterization 2010    LAD PCI and stent placement    History   Social History  . Marital Status: Single    Spouse Name: N/A    Number of Children: N/A  . Years of Education: N/A   Occupational History  . RETIRED     retired bus Office manager office   Social History Main Topics  . Smoking status: Former Smoker -- 0.2 packs/day for 40 years    Types: Cigarettes  . Smokeless tobacco: Never Used   Comment: smokes socially/ smokes 2 cigarettes per month, trying to quit  . Alcohol Use: Yes     occasional alcohol use  . Drug Use: No  . Sexually Active: Not on file   Other Topics  Concern  . Not on file   Social History Narrative   Moved to [place] from [place].  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 Diabetes type II Mother   . Heart attack Brother 51    cardiac arrest  . Seizures Sister   . Other Father     accident  . Other Brother     pacemaker    Allergies  Allergen Reactions  . Penicillins Hives  . Tetracyclines & Related     Current Outpatient Prescriptions  Medication Sig Dispense Refill  . aspirin 81 MG tablet Take 81 mg by mouth daily.        . carvedilol (COREG) 6.25 MG tablet Take 1 tablet (6.25 mg total) by mouth 2 (two) times daily.  60 tablet  4  . clopidogrel (PLAVIX) 75 MG tablet Take 1 tablet (75 mg total) by mouth daily.  30 tablet  6  . losartan (COZAAR) 50 MG tablet Take 1 tablet (50 mg total) by mouth daily.  30 tablet  6  . nitroGLYCERIN (NITROSTAT) 0.4 MG SL tablet Place 0.4 mg under the tongue every 5 (five) minutes as needed.      . simvastatin (ZOCOR) 40 MG tablet Take 1 tablet (40 mg total) by mouth at bedtime.  30 tablet  6  .  spironolactone (ALDACTONE) 25 MG tablet Take 1 tablet (25 mg total) by mouth daily.  30 tablet  6    ROS- all systems are reviewed and negative except as per HPI  Physical Exam: Filed Vitals:   09/05/11 1141  BP: 103/69  Pulse: 64  Resp: 18  Height: 5\' 2"  (1.575 m)  Weight: 158 lb (71.668 kg)    GEN- The patient is overweight appearing, alert and oriented x 3 today.   Head- normocephalic, atraumatic Eyes-  Sclera clear, conjunctiva pink Ears- hearing intact Oropharynx- clear Neck- supple, no JVP Lymph- no cervical lymphadenopathy Lungs- Clear to ausculation bilaterally, normal work of breathing Chest- ICD pocket is well healed Heart- Regular rate and rhythm, no murmurs, rubs or gallops, PMI not laterally displaced GI- soft, NT, ND, + BS Extremities- no clubbing, cyanosis, or edema MS- no significant deformity  or atrophy Skin- no rash or lesion Psych- euthymic mood, full affect Neuro- strength and sensation are intact  ICD interrogation- reviewed in detail today,  See PACEART report  Assessment and Plan:

## 2011-09-06 ENCOUNTER — Encounter: Payer: Self-pay | Admitting: Cardiology

## 2011-10-10 ENCOUNTER — Encounter: Payer: Medicare HMO | Admitting: *Deleted

## 2011-10-18 ENCOUNTER — Encounter: Payer: Self-pay | Admitting: *Deleted

## 2011-10-24 ENCOUNTER — Encounter: Payer: Self-pay | Admitting: Internal Medicine

## 2011-10-24 ENCOUNTER — Ambulatory Visit (INDEPENDENT_AMBULATORY_CARE_PROVIDER_SITE_OTHER): Payer: Medicare HMO | Admitting: *Deleted

## 2011-10-24 DIAGNOSIS — I5022 Chronic systolic (congestive) heart failure: Secondary | ICD-10-CM

## 2011-10-24 DIAGNOSIS — I509 Heart failure, unspecified: Secondary | ICD-10-CM

## 2011-10-25 LAB — REMOTE ICD DEVICE
AL IMPEDENCE ICD: 437 Ohm
AL THRESHOLD: 0.625 V
BATTERY VOLTAGE: 3.0852 V
DEV-0020ICD: NEGATIVE
RV LEAD AMPLITUDE: 29.5 mv
RV LEAD THRESHOLD: 1.25 V
TOT-0006: 20110119000000
TZAT-0001ATACH: 2
TZAT-0001ATACH: 3
TZAT-0001SLOWVT: 1
TZAT-0001SLOWVT: 2
TZAT-0002ATACH: NEGATIVE
TZAT-0004SLOWVT: 8
TZAT-0005SLOWVT: 84 pct
TZAT-0005SLOWVT: 91 pct
TZAT-0011SLOWVT: 10 ms
TZAT-0011SLOWVT: 10 ms
TZAT-0012ATACH: 150 ms
TZAT-0012ATACH: 150 ms
TZAT-0012FASTVT: 200 ms
TZAT-0018ATACH: NEGATIVE
TZAT-0018ATACH: NEGATIVE
TZAT-0018FASTVT: NEGATIVE
TZAT-0018SLOWVT: NEGATIVE
TZAT-0019ATACH: 6 V
TZAT-0019FASTVT: 8 V
TZAT-0020ATACH: 1.5 ms
TZAT-0020ATACH: 1.5 ms
TZAT-0020FASTVT: 1.5 ms
TZON-0004VSLOWVT: 36
TZST-0001ATACH: 4
TZST-0001ATACH: 6
TZST-0001FASTVT: 3
TZST-0001FASTVT: 5
TZST-0001FASTVT: 6
TZST-0001SLOWVT: 3
TZST-0001SLOWVT: 4
TZST-0001SLOWVT: 6
TZST-0002ATACH: NEGATIVE
TZST-0002ATACH: NEGATIVE
TZST-0002FASTVT: NEGATIVE
TZST-0002FASTVT: NEGATIVE
TZST-0002FASTVT: NEGATIVE
TZST-0003SLOWVT: 25 J
TZST-0003SLOWVT: 35 J
VENTRICULAR PACING ICD: 0.04 pct
VF: 1

## 2011-10-29 ENCOUNTER — Ambulatory Visit: Payer: Medicare HMO | Admitting: Cardiology

## 2011-11-06 ENCOUNTER — Encounter: Payer: Self-pay | Admitting: *Deleted

## 2011-11-12 ENCOUNTER — Ambulatory Visit (INDEPENDENT_AMBULATORY_CARE_PROVIDER_SITE_OTHER): Payer: Medicare HMO | Admitting: Cardiology

## 2011-11-12 ENCOUNTER — Encounter: Payer: Self-pay | Admitting: Cardiology

## 2011-11-12 VITALS — BP 109/71 | HR 65 | Ht 62.5 in | Wt 155.8 lb

## 2011-11-12 DIAGNOSIS — I2510 Atherosclerotic heart disease of native coronary artery without angina pectoris: Secondary | ICD-10-CM

## 2011-11-12 MED ORDER — NITROGLYCERIN 0.4 MG SL SUBL: 0.4000 mg | SUBLINGUAL_TABLET | SUBLINGUAL | Status: DC | PRN

## 2011-11-12 MED ORDER — CARVEDILOL 3.125 MG PO TABS: 3.1250 mg | ORAL_TABLET | Freq: Two times a day (BID) | ORAL | Status: DC

## 2011-11-12 NOTE — Patient Instructions (Addendum)
Your physician recommends that you schedule a follow-up appointment in:1 month with Dr. Antoine Poche.  Your physician has recommended you make the following change in your medication:increase carvedilol to 9.375 mg twice daily. Please take 3.125 mg twice daily in addition to your 6.25 mg twice daily. All other medications are still the same.

## 2011-11-12 NOTE — Assessment & Plan Note (Signed)
She seems to be euvolemic.  We have reviewed salt and fluid restrictions.  No further cardiovascular testing is indicated.  I will up titrate the carvedilol to 9.375 mg twice a day. She will continue to consider whether she wants upgrade to CRT.

## 2011-11-12 NOTE — Progress Notes (Signed)
   HPI The patient presents for evaluation of CAD and ischemic cardiomyopathy.  She was seen recently by Dr. Johney Frame to discuss a possible left ventricular lead and upgrade to CRT. However, she has not wanted to have this yet. She says she's actually breathing well. She reports being able to climb a flight of stairs with only mild dyspnea (Class II).  She denies PND or orthopnea. She's not reporting palpitations, presyncope or syncope. She has no chest pressure, neck or arm discomfort. She's had no weight gain or edema. She tolerated medication changes including changing to carvedilol.  Allergies  Allergen Reactions  . Penicillins Hives  . Tetracyclines & Related     Current Outpatient Prescriptions  Medication Sig Dispense Refill  . aspirin 81 MG tablet Take 81 mg by mouth daily.        . carvedilol (COREG) 6.25 MG tablet Take 1 tablet (6.25 mg total) by mouth 2 (two) times daily.  60 tablet  4  . clopidogrel (PLAVIX) 75 MG tablet Take 1 tablet (75 mg total) by mouth daily.  30 tablet  6  . losartan (COZAAR) 50 MG tablet Take 1 tablet (50 mg total) by mouth daily.  30 tablet  6  . nitroGLYCERIN (NITROSTAT) 0.4 MG SL tablet Place 0.4 mg under the tongue every 5 (five) minutes as needed.      . simvastatin (ZOCOR) 40 MG tablet Take 1 tablet (40 mg total) by mouth at bedtime.  30 tablet  6  . spironolactone (ALDACTONE) 25 MG tablet Take 1 tablet (25 mg total) by mouth daily.  30 tablet  6    Past Medical History  Diagnosis Date  . Chronic systolic congestive heart failure   . Hypertension   . Tobacco abuse   . Coronary artery disease     anterior MI in 2010. LAD stent placement 2010. EF: 15%  . MR (mitral regurgitation)     moderate to severe  . LBBB (left bundle branch block)     Past Surgical History  Procedure Date  . Cardiac defibrillator placement 2011  . Tonsillectomy and adenoidectomy   . Tubal ligation   . Cardiac catheterization 2010    LAD PCI and stent placement     ROS:  As stated in the HPI and negative for all other systems.   PHYSICAL EXAM BP 109/71  Pulse 65  Ht 5' 2.5" (1.588 m)  Wt 155 lb 12.8 oz (70.67 kg)  BMI 28.04 kg/m2 GENERAL:  Well appearing HEENT:  Pupils equal round and reactive, fundi not visualized, oral mucosa unremarkable NECK:  No jugular venous distention, waveform within normal limits, carotid upstroke brisk and symmetric, no bruits, no thyromegaly LYMPHATICS:  No cervical, inguinal adenopathy LUNGS:  Clear to auscultation bilaterally BACK:  No CVA tenderness CHEST:  ICD pocket HEART:  PMI not displaced or sustained,S1 and S2 within normal limits, no S3, no S4, no clicks, no rubs, no murmurs ABD:  Flat, positive bowel sounds normal in frequency in pitch, no bruits, no rebound, no guarding, no midline pulsatile mass, no hepatomegaly, no splenomegaly EXT:  2 plus pulses upper and diminished lower, no edema, no cyanosis no clubbing SKIN:  No rashes no nodules NEURO:  Cranial nerves II through XII grossly intact, motor grossly intact throughout PSYCH:  Cognitively intact, oriented to person place and time  EKG:   Atrial paced rhythm, LBBB rate 65. 11/12/2011   ASSESSMENT AND PLAN

## 2011-11-12 NOTE — Assessment & Plan Note (Signed)
This is being managed in the context of treating his CHF  

## 2011-11-12 NOTE — Assessment & Plan Note (Signed)
She said she still smokes "Once in a blue moon."  We discussed complete abstinence.

## 2011-11-12 NOTE — Assessment & Plan Note (Signed)
The patient has no new sypmtoms.  No further cardiovascular testing is indicated.  We will continue with aggressive risk reduction and meds as listed.  

## 2011-12-09 ENCOUNTER — Ambulatory Visit (INDEPENDENT_AMBULATORY_CARE_PROVIDER_SITE_OTHER): Payer: Medicare HMO | Admitting: Cardiology

## 2011-12-09 ENCOUNTER — Encounter: Payer: Self-pay | Admitting: Cardiology

## 2011-12-09 VITALS — BP 112/72 | HR 79 | Ht 63.0 in | Wt 149.8 lb

## 2011-12-09 DIAGNOSIS — E785 Hyperlipidemia, unspecified: Secondary | ICD-10-CM

## 2011-12-09 DIAGNOSIS — F17200 Nicotine dependence, unspecified, uncomplicated: Secondary | ICD-10-CM

## 2011-12-09 DIAGNOSIS — I340 Nonrheumatic mitral (valve) insufficiency: Secondary | ICD-10-CM

## 2011-12-09 DIAGNOSIS — I509 Heart failure, unspecified: Secondary | ICD-10-CM

## 2011-12-09 DIAGNOSIS — I10 Essential (primary) hypertension: Secondary | ICD-10-CM

## 2011-12-09 DIAGNOSIS — I5022 Chronic systolic (congestive) heart failure: Secondary | ICD-10-CM

## 2011-12-09 DIAGNOSIS — Z720 Tobacco use: Secondary | ICD-10-CM

## 2011-12-09 DIAGNOSIS — I059 Rheumatic mitral valve disease, unspecified: Secondary | ICD-10-CM

## 2011-12-09 DIAGNOSIS — I2510 Atherosclerotic heart disease of native coronary artery without angina pectoris: Secondary | ICD-10-CM

## 2011-12-09 MED ORDER — CARVEDILOL 6.25 MG PO TABS: 12.5000 mg | ORAL_TABLET | Freq: Two times a day (BID) | ORAL | Status: DC

## 2011-12-09 NOTE — Progress Notes (Signed)
HPI The patient presents for evaluation of CAD and ischemic cardiomyopathy.   At the last visit I increased her carvedilol. She did well with this. She has had no further shortness of breath, PND or orthopnea. She does get some dyspnea with exertion but this has been chronic. She didn't have any lightheadedness with her meds changed. She's had no presyncope or syncope. She's had no palpitations or chest pressure.  Allergies  Allergen Reactions  . Penicillins Hives  . Tetracyclines & Related     Current Outpatient Prescriptions  Medication Sig Dispense Refill  . aspirin 81 MG tablet Take 81 mg by mouth daily.        . carvedilol (COREG) 3.125 MG tablet Take 1 tablet (3.125 mg total) by mouth 2 (two) times daily with a meal.  60 tablet  3  . carvedilol (COREG) 6.25 MG tablet Take 1 tablet (6.25 mg total) by mouth 2 (two) times daily.  60 tablet  4  . clopidogrel (PLAVIX) 75 MG tablet Take 1 tablet (75 mg total) by mouth daily.  30 tablet  6  . losartan (COZAAR) 50 MG tablet Take 1 tablet (50 mg total) by mouth daily.  30 tablet  6  . nitroGLYCERIN (NITROSTAT) 0.4 MG SL tablet Place 1 tablet (0.4 mg total) under the tongue every 5 (five) minutes as needed.  25 tablet  2  . simvastatin (ZOCOR) 40 MG tablet Take 1 tablet (40 mg total) by mouth at bedtime.  30 tablet  6  . spironolactone (ALDACTONE) 25 MG tablet Take 1 tablet (25 mg total) by mouth daily.  30 tablet  6    Past Medical History  Diagnosis Date  . Chronic systolic congestive heart failure   . Hypertension   . Tobacco abuse   . Coronary artery disease     anterior MI in 2010. LAD stent placement 2010. EF: 15%  . MR (mitral regurgitation)     moderate to severe  . LBBB (left bundle branch block)     Past Surgical History  Procedure Date  . Cardiac defibrillator placement 2011  . Tonsillectomy and adenoidectomy   . Tubal ligation   . Cardiac catheterization 2010    LAD PCI and stent placement    ROS:  As stated in  the HPI and negative for all other systems.   PHYSICAL EXAM BP 112/72  Pulse 79  Ht 5\' 3"  (1.6 m)  Wt 149 lb 12.8 oz (67.949 kg)  BMI 26.54 kg/m2  SpO2 94% GENERAL:  Well appearing NECK:  No jugular venous distention, waveform within normal limits, carotid upstroke brisk and symmetric, no bruits, no thyromegaly LUNGS:  Clear to auscultation bilaterally BACK:  No CVA tenderness CHEST:  ICD pocket HEART:  PMI not displaced or sustained,S1 and S2 within normal limits, no S3, no S4, no clicks, no rubs, no murmurs ABD:  Flat, positive bowel sounds normal in frequency in pitch, no bruits, no rebound, no guarding, no midline pulsatile mass, no hepatomegaly, no splenomegaly EXT:  2 plus pulses upper and diminished lower, no edema, no cyanosis no clubbing   EKG:   Atrial paced rhythm, LBBB rate 65. 12/09/2011   ASSESSMENT AND PLAN   Chronic systolic congestive heart failure -  She seems to be euvolemic. We have reviewed salt and fluid restrictions. No further cardiovascular testing is indicated. I will up titrate the carvedilol to 12.5 mg twice a day. She is still considering whether she wants upgrade to CRT.   Hypertension -  This is being managed in the context of treating her CHF   Tobacco abuse -  She said she still smokes "Once in a blue moon"  Which has apparently happened at least twice since the last visit. I asked her to completely abstain.  CAD (coronary artery disease) -  The patient has no new sypmtoms. No further cardiovascular testing is indicated. We will continue with aggressive risk reduction and meds as listed.

## 2011-12-09 NOTE — Patient Instructions (Addendum)
Your physician recommends that you schedule a follow-up appointment in: 2 months with Dr. Antoine Poche. Your physician has recommended you make the following change in your medication: increase carvedilol to 12.5 mg twice daily. Please take (2) of your 6.25 mg twice daily to equal this dose. Your new prescription has been sent to your pharmacy.

## 2012-01-30 ENCOUNTER — Ambulatory Visit (INDEPENDENT_AMBULATORY_CARE_PROVIDER_SITE_OTHER): Payer: Medicare HMO | Admitting: *Deleted

## 2012-01-30 ENCOUNTER — Encounter: Payer: Self-pay | Admitting: Internal Medicine

## 2012-01-30 DIAGNOSIS — I5022 Chronic systolic (congestive) heart failure: Secondary | ICD-10-CM

## 2012-01-30 DIAGNOSIS — I509 Heart failure, unspecified: Secondary | ICD-10-CM

## 2012-01-30 DIAGNOSIS — I428 Other cardiomyopathies: Secondary | ICD-10-CM

## 2012-02-03 ENCOUNTER — Ambulatory Visit: Payer: Medicare HMO | Admitting: Cardiology

## 2012-02-04 LAB — REMOTE ICD DEVICE
AL AMPLITUDE: 2.1 mv
AL IMPEDENCE ICD: 475 Ohm
AL THRESHOLD: 0.625 V
BATTERY VOLTAGE: 3.0784 V
CHARGE TIME: 9.138 s
DEV-0020ICD: NEGATIVE
PACEART VT: 0
RV LEAD AMPLITUDE: 20 mv
RV LEAD IMPEDENCE ICD: 551 Ohm
TOT-0001: 1
TOT-0002: 1
TOT-0006: 20110119000000
TZAT-0001ATACH: 1
TZAT-0001FASTVT: 1
TZAT-0001SLOWVT: 1
TZAT-0001SLOWVT: 2
TZAT-0002FASTVT: NEGATIVE
TZAT-0004SLOWVT: 8
TZAT-0011ATACH: 10 ms
TZAT-0011ATACH: 10 ms
TZAT-0012ATACH: 150 ms
TZAT-0013SLOWVT: 3
TZAT-0013SLOWVT: 3
TZAT-0018FASTVT: NEGATIVE
TZAT-0018SLOWVT: NEGATIVE
TZAT-0018SLOWVT: NEGATIVE
TZAT-0019ATACH: 6 V
TZAT-0019FASTVT: 8 V
TZAT-0020ATACH: 1.5 ms
TZAT-0020ATACH: 1.5 ms
TZAT-0020ATACH: 1.5 ms
TZAT-0020SLOWVT: 1.5 ms
TZAT-0020SLOWVT: 1.5 ms
TZON-0003VSLOWVT: 400 ms
TZON-0004SLOWVT: 28
TZON-0004VSLOWVT: 36
TZON-0005SLOWVT: 12
TZST-0001ATACH: 4
TZST-0001ATACH: 5
TZST-0001FASTVT: 4
TZST-0001FASTVT: 5
TZST-0001FASTVT: 6
TZST-0001SLOWVT: 3
TZST-0001SLOWVT: 5
TZST-0001SLOWVT: 6
TZST-0002ATACH: NEGATIVE
TZST-0002FASTVT: NEGATIVE
TZST-0002FASTVT: NEGATIVE
TZST-0003SLOWVT: 35 J

## 2012-02-06 ENCOUNTER — Encounter: Payer: Self-pay | Admitting: Cardiology

## 2012-02-06 ENCOUNTER — Ambulatory Visit (INDEPENDENT_AMBULATORY_CARE_PROVIDER_SITE_OTHER): Payer: Medicare HMO | Admitting: Cardiology

## 2012-02-06 VITALS — BP 104/68 | HR 86 | Ht 63.0 in | Wt 144.8 lb

## 2012-02-06 DIAGNOSIS — I5022 Chronic systolic (congestive) heart failure: Secondary | ICD-10-CM

## 2012-02-06 DIAGNOSIS — I10 Essential (primary) hypertension: Secondary | ICD-10-CM

## 2012-02-06 DIAGNOSIS — I059 Rheumatic mitral valve disease, unspecified: Secondary | ICD-10-CM

## 2012-02-06 DIAGNOSIS — I2510 Atherosclerotic heart disease of native coronary artery without angina pectoris: Secondary | ICD-10-CM

## 2012-02-06 DIAGNOSIS — I509 Heart failure, unspecified: Secondary | ICD-10-CM

## 2012-02-06 DIAGNOSIS — E785 Hyperlipidemia, unspecified: Secondary | ICD-10-CM

## 2012-02-06 DIAGNOSIS — I340 Nonrheumatic mitral (valve) insufficiency: Secondary | ICD-10-CM

## 2012-02-06 MED ORDER — CARVEDILOL 3.125 MG PO TABS: 3.1250 mg | ORAL_TABLET | Freq: Two times a day (BID) | ORAL | Status: DC

## 2012-02-06 NOTE — Patient Instructions (Addendum)
Your physician recommends that you schedule a follow-up appointment in: 3 months. Your physician has recommended you make the following change in your medication: Add carvedilol 3.125 mg twice daily to your other medications. All medications will remain the same with this addition.

## 2012-02-06 NOTE — Progress Notes (Signed)
HPI The patient presents for evaluation of CAD and ischemic cardiomyopathy.   At the last visit I increased her carvedilol. She did well with this. She has had no further shortness of breath, PND or orthopnea. She does get some dyspnea with exertion but this has been chronic. She's had no palpitations or chest pressure.  She exercises at the [facility] without chest pressure or other symptoms. She's not smoking cigarettes. She did send transmission of her device for routine checkup she had a couple of brief runs of nonsustained ventricular tachycardia longest 9 beats.  Allergies  Allergen Reactions  . Penicillins Hives  . Tetracyclines & Related     Current Outpatient Prescriptions  Medication Sig Dispense Refill  . aspirin 81 MG tablet Take 81 mg by mouth daily.        . carvedilol (COREG) 6.25 MG tablet Take 2 tablets (12.5 mg total) by mouth 2 (two) times daily.  120 tablet  3  . clopidogrel (PLAVIX) 75 MG tablet Take 1 tablet (75 mg total) by mouth daily.  30 tablet  6  . losartan (COZAAR) 50 MG tablet Take 1 tablet (50 mg total) by mouth daily.  30 tablet  6  . nitroGLYCERIN (NITROSTAT) 0.4 MG SL tablet Place 1 tablet (0.4 mg total) under the tongue every 5 (five) minutes as needed.  25 tablet  2  . simvastatin (ZOCOR) 40 MG tablet Take 1 tablet (40 mg total) by mouth at bedtime.  30 tablet  6  . spironolactone (ALDACTONE) 25 MG tablet Take 1 tablet (25 mg total) by mouth daily.  30 tablet  6    Past Medical History  Diagnosis Date  . Chronic systolic congestive heart failure   . Hypertension   . Tobacco abuse   . Coronary artery disease     anterior MI in 2010. LAD stent placement 2010. EF: 15%  . MR (mitral regurgitation)     moderate to severe  . LBBB (left bundle branch block)     Past Surgical History  Procedure Date  . Cardiac defibrillator placement 2011  . Tonsillectomy and adenoidectomy   . Tubal ligation   . Cardiac catheterization 2010    LAD PCI and stent  placement    ROS:  As stated in the HPI and negative for all other systems.   PHYSICAL EXAM BP 104/68  Pulse 86  Ht 5\' 3"  (1.6 m)  Wt 144 lb 12.8 oz (65.681 kg)  BMI 25.65 kg/m2  SpO2 97% GENERAL:  Well appearing NECK:  No jugular venous distention, waveform within normal limits, carotid upstroke brisk and symmetric, no bruits, no thyromegaly LUNGS:  Clear to auscultation bilaterally BACK:  No CVA tenderness CHEST:  ICD pocket HEART:  PMI not displaced or sustained,S1 and S2 within normal limits, no S3, no S4, no clicks, no rubs, no murmurs ABD:  Flat, positive bowel sounds normal in frequency in pitch, no bruits, no rebound, no guarding, no midline pulsatile mass, no hepatomegaly, no splenomegaly EXT:  2 plus pulses upper and diminished lower, no edema, no cyanosis no clubbing   EKG:   Atrial paced rhythm, LBBB rate 65. 02/06/2012   ASSESSMENT AND PLAN   Chronic systolic congestive heart failure -  She seems to be euvolemic.  At this point I will try to creep up on her carvedilol by adding another 3.125 mg twice daily.  Hypertension -  This is being managed in the context of treating her CHF   Tobacco abuse -  She says she's not smoking at all now. I congratulated her on this.  CAD (coronary artery disease) -  The patient has no new sypmtoms. No further cardiovascular testing is indicated. We will continue with aggressive risk reduction and meds as listed.

## 2012-02-19 ENCOUNTER — Other Ambulatory Visit: Payer: Self-pay | Admitting: Cardiovascular Disease

## 2012-02-28 ENCOUNTER — Encounter: Payer: Self-pay | Admitting: *Deleted

## 2012-03-03 ENCOUNTER — Other Ambulatory Visit: Payer: Self-pay | Admitting: Cardiovascular Disease

## 2012-03-20 ENCOUNTER — Telehealth: Payer: Self-pay | Admitting: Cardiology

## 2012-03-20 NOTE — Telephone Encounter (Signed)
Called pharmacy to clarify instructions for patient carvedilol 6.25. Patient takes 2 tablets by mouth twice daily in addition to 3.125 one tablet twice a day. Gave verbal orders for pharmacy to give patient 120 tablets of carvedilol 6.25mg  with 6 refills.

## 2012-04-05 ENCOUNTER — Encounter: Payer: Self-pay | Admitting: *Deleted

## 2012-04-10 ENCOUNTER — Ambulatory Visit (INDEPENDENT_AMBULATORY_CARE_PROVIDER_SITE_OTHER): Payer: Medicare HMO | Admitting: Internal Medicine

## 2012-04-10 ENCOUNTER — Encounter: Payer: Self-pay | Admitting: Internal Medicine

## 2012-04-10 VITALS — BP 104/64 | HR 64 | Ht 63.0 in | Wt 144.0 lb

## 2012-04-10 DIAGNOSIS — I509 Heart failure, unspecified: Secondary | ICD-10-CM

## 2012-04-10 DIAGNOSIS — I5022 Chronic systolic (congestive) heart failure: Secondary | ICD-10-CM

## 2012-04-10 DIAGNOSIS — Z950 Presence of cardiac pacemaker: Secondary | ICD-10-CM

## 2012-04-10 DIAGNOSIS — I472 Ventricular tachycardia, unspecified: Secondary | ICD-10-CM

## 2012-04-10 LAB — ICD DEVICE OBSERVATION
AL AMPLITUDE: 2.4 mv
AL IMPEDENCE ICD: 475 Ohm
ATRIAL PACING ICD: 35.26 pct
CHARGE TIME: 9.138 s
RV LEAD IMPEDENCE ICD: 551 Ohm
TOT-0001: 1
TOT-0002: 1
TOT-0006: 20110119000000
TZAT-0001ATACH: 3
TZAT-0001FASTVT: 1
TZAT-0002ATACH: NEGATIVE
TZAT-0004ATACH: 6
TZAT-0011ATACH: 10 ms
TZAT-0012ATACH: 150 ms
TZAT-0012FASTVT: 200 ms
TZAT-0012SLOWVT: 200 ms
TZAT-0012SLOWVT: 200 ms
TZAT-0013SLOWVT: 3
TZAT-0018ATACH: NEGATIVE
TZAT-0018ATACH: NEGATIVE
TZAT-0018ATACH: NEGATIVE
TZAT-0018SLOWVT: NEGATIVE
TZAT-0018SLOWVT: NEGATIVE
TZAT-0019ATACH: 6 V
TZAT-0019ATACH: 6 V
TZAT-0019ATACH: 6 V
TZAT-0019SLOWVT: 8 V
TZAT-0019SLOWVT: 8 V
TZAT-0020SLOWVT: 1.5 ms
TZAT-0020SLOWVT: 1.5 ms
TZON-0003SLOWVT: 350 ms
TZON-0003VSLOWVT: 400 ms
TZON-0005SLOWVT: 12
TZST-0001ATACH: 4
TZST-0001ATACH: 5
TZST-0001ATACH: 6
TZST-0001FASTVT: 4
TZST-0001FASTVT: 5
TZST-0001SLOWVT: 4
TZST-0001SLOWVT: 5
TZST-0002ATACH: NEGATIVE
TZST-0002ATACH: NEGATIVE
TZST-0002FASTVT: NEGATIVE
TZST-0002FASTVT: NEGATIVE
TZST-0003SLOWVT: 25 J
TZST-0003SLOWVT: 35 J
TZST-0003SLOWVT: 35 J

## 2012-04-10 NOTE — Progress Notes (Signed)
  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  Since last being seen in our clinic, the patient reports doing very well.  Today, she denies symptoms of palpitations, chest pain, shortness of breath,  lower extremity edema, dizziness, presyncope, syncope, or ICD shocks.  The patient is otherwise without complaint today.   Past Medical History  Diagnosis Date  . Chronic systolic congestive heart failure   . Hypertension   . Tobacco abuse   . Coronary artery disease     anterior MI in 2010. LAD stent placement 2010. EF: 15%  . MR (mitral regurgitation)     moderate to severe  . LBBB (left bundle branch block)    Past Surgical History  Procedure Date  . Cardiac defibrillator placement 2011  . Tonsillectomy and adenoidectomy   . Tubal ligation   . Cardiac catheterization 2010    LAD PCI and stent placement    Current Outpatient Prescriptions  Medication Sig Dispense Refill  . aspirin 81 MG tablet Take 81 mg by mouth daily.        . carvedilol (COREG) 3.125 MG tablet Take 1 tablet (3.125 mg total) by mouth 2 (two) times daily with a meal.  60 tablet  3  . carvedilol (COREG) 6.25 MG tablet Take 2 tablets (12.5 mg total) by mouth 2 (two) times daily.  120 tablet  3  . clopidogrel (PLAVIX) 75 MG tablet TAKE ONE TABLET BY MOUTH EVERY DAY  30 tablet  5  . losartan (COZAAR) 50 MG tablet Take 1 tablet (50 mg total) by mouth daily.  30 tablet  6  . nitroGLYCERIN (NITROSTAT) 0.4 MG SL tablet Place 1 tablet (0.4 mg total) under the tongue every 5 (five) minutes as needed.  25 tablet  2  . simvastatin (ZOCOR) 40 MG tablet Take 1 tablet (40 mg total) by mouth at bedtime.  30 tablet  6  . spironolactone (ALDACTONE) 25 MG tablet Take 1 tablet (25 mg total) by mouth daily.  30 tablet  6  . [DISCONTINUED] carvedilol (COREG) 6.25 MG tablet TAKE ONE TABLET BY MOUTH TWICE DAILY. DOSE CHANGE.  60  tablet  3    Physical Exam: Filed Vitals:   04/10/12 1535  BP: 104/64  Pulse: 64  Height: 5\' 3"  (1.6 m)  Weight: 144 lb (65.318 kg)    GEN- The patient is well appearing, alert and oriented x 3 today.   Head- normocephalic, atraumatic Eyes-  Sclera clear, conjunctiva pink Ears- hearing intact Oropharynx- clear Lungs- Clear to ausculation bilaterally, normal work of breathing Chest- ICD pocket is well healed Heart- Regular rate and rhythm, no murmurs, rubs or gallops, PMI not laterally displaced GI- soft, NT, ND, + BS Extremities- no clubbing, cyanosis, or edema  ICD interrogation- reviewed in detail today,  See PACEART report  Assessment and Plan:  1.  Chronic systolic dysfunction euvolemic today Stable on an appropriate medical regimen Normal ICD function She again declines upgrade to CRT device when discussed today See Pace Art report No changes today  2. VT- pt had nonsustained but fast VT which spontaneously terminated 5/13 not requiring ICD therapy. Her coreg has since been increased by Dr Antoine Poche. Normal ICD function. Pt will continue with coreg titration under the instruction of Dr Antoine Poche.  carelink every 3 months Return to device clinic in 1 year

## 2012-04-10 NOTE — Patient Instructions (Signed)
Continue all current medications. Continue Carelink Dr. Johney Frame - 1 year

## 2012-04-15 ENCOUNTER — Encounter: Payer: Medicare HMO | Admitting: Internal Medicine

## 2012-04-30 ENCOUNTER — Ambulatory Visit: Payer: Medicare HMO | Admitting: Cardiology

## 2012-05-01 ENCOUNTER — Other Ambulatory Visit: Payer: Self-pay | Admitting: *Deleted

## 2012-05-01 MED ORDER — SPIRONOLACTONE 25 MG PO TABS: 25.0000 mg | ORAL_TABLET | Freq: Every day | ORAL | Status: DC

## 2012-05-14 ENCOUNTER — Encounter: Payer: Self-pay | Admitting: Physician Assistant

## 2012-05-14 ENCOUNTER — Ambulatory Visit (INDEPENDENT_AMBULATORY_CARE_PROVIDER_SITE_OTHER): Payer: Medicare HMO | Admitting: Physician Assistant

## 2012-05-14 VITALS — BP 111/75 | HR 90 | Ht 63.0 in | Wt 145.0 lb

## 2012-05-14 DIAGNOSIS — I472 Ventricular tachycardia, unspecified: Secondary | ICD-10-CM

## 2012-05-14 DIAGNOSIS — E785 Hyperlipidemia, unspecified: Secondary | ICD-10-CM

## 2012-05-14 DIAGNOSIS — I2589 Other forms of chronic ischemic heart disease: Secondary | ICD-10-CM

## 2012-05-14 DIAGNOSIS — I255 Ischemic cardiomyopathy: Secondary | ICD-10-CM

## 2012-05-14 DIAGNOSIS — I5022 Chronic systolic (congestive) heart failure: Secondary | ICD-10-CM

## 2012-05-14 DIAGNOSIS — I10 Essential (primary) hypertension: Secondary | ICD-10-CM

## 2012-05-14 DIAGNOSIS — I509 Heart failure, unspecified: Secondary | ICD-10-CM

## 2012-05-14 MED ORDER — CARVEDILOL 25 MG PO TABS: 25.0000 mg | ORAL_TABLET | Freq: Two times a day (BID) | ORAL | Status: DC

## 2012-05-14 NOTE — Assessment & Plan Note (Signed)
Quiescent on current medication regimen. 

## 2012-05-14 NOTE — Assessment & Plan Note (Signed)
Status post ICD, followed by Dr. Johney Frame, with recent scheduled device clinic visit.

## 2012-05-14 NOTE — Assessment & Plan Note (Signed)
Well-controlled on current medication regimen 

## 2012-05-14 NOTE — Patient Instructions (Signed)
   Increase Coreg to 25mg  twice a day    Call if systolic blood pressure (top number) is less than 100 or heart rate less than 55 Continue all other current medications. Your physician wants you to follow up in: 6 months.  You will receive a reminder letter in the mail one-two months in advance.  If you don't receive a letter, please call our office to schedule the follow up appointment

## 2012-05-14 NOTE — Assessment & Plan Note (Signed)
Euvolemic by history and exam. Weight up 1 pound since last OV. Will uptitrate carvedilol to target dose 25 twice a day. Patient instructed to monitor her BP/HR at home, and to hold dose if SBP less than 100 or HR less than 55. We'll request most recent labs from Dr. Sherril Croon' office, regarding potassium level and renal function.

## 2012-05-14 NOTE — Assessment & Plan Note (Signed)
Followed by primary M.D. We'll request most recent lab results. Aggressive management recommended with target LDL 70 or less, if feasible.

## 2012-05-14 NOTE — Progress Notes (Signed)
Primary Cardiologist: Rollene Rotunda, MD   HPI: Scheduled 3 month followup.  Patient weighs herself periodically, refrains from added salt in her diet, and remains compliant with her medications. She is tolerating the recently increased dose of carvedilol, reporting SBPs greater than 100 at home.  Allergies  Allergen Reactions  . Penicillins Hives  . Tetracyclines & Related     Current Outpatient Prescriptions  Medication Sig Dispense Refill  . aspirin 81 MG tablet Take 81 mg by mouth daily.        . carvedilol (COREG) 3.125 MG tablet Take 1 tablet (3.125 mg total) by mouth 2 (two) times daily with a meal.  60 tablet  3  . carvedilol (COREG) 6.25 MG tablet Take 2 tablets (12.5 mg total) by mouth 2 (two) times daily.  120 tablet  3  . clopidogrel (PLAVIX) 75 MG tablet TAKE ONE TABLET BY MOUTH EVERY DAY  30 tablet  5  . losartan (COZAAR) 50 MG tablet Take 1 tablet (50 mg total) by mouth daily.  30 tablet  6  . nitroGLYCERIN (NITROSTAT) 0.4 MG SL tablet Place 1 tablet (0.4 mg total) under the tongue every 5 (five) minutes as needed.  25 tablet  2  . simvastatin (ZOCOR) 40 MG tablet Take 1 tablet (40 mg total) by mouth at bedtime.  30 tablet  6  . spironolactone (ALDACTONE) 25 MG tablet Take 1 tablet (25 mg total) by mouth daily.  30 tablet  6    Past Medical History  Diagnosis Date  . Chronic systolic congestive heart failure   . Hypertension   . Tobacco abuse   . Coronary artery disease     anterior MI in 2010. LAD stent placement 2010. EF: 15%  . MR (mitral regurgitation)     moderate to severe  . LBBB (left bundle branch block)     Past Surgical History  Procedure Date  . Cardiac defibrillator placement 2011  . Tonsillectomy and adenoidectomy   . Tubal ligation   . Cardiac catheterization 2010    LAD PCI and stent placement    History   Social History  . Marital Status: Single    Spouse Name: N/A    Number of Children: N/A  . Years of Education: N/A    Occupational History  . RETIRED     retired bus Office manager office   Social History Main Topics  . Smoking status: Former Smoker -- 0.2 packs/day for 40 years    Types: Cigarettes  . Smokeless tobacco: Never Used     Comment: smokes socially/ smokes 2 cigarettes per month, trying to quit  . Alcohol Use: Yes     Comment: occasional alcohol use  . Drug Use: No  . Sexually Active: Not on file   Other Topics Concern  . Not on file   Social History Narrative   Moved to [place] from [place].  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 Diabetes type II Mother   . Heart attack Brother 51    cardiac arrest  . Seizures Sister   . Other Father     accident  . Other Brother     pacemaker    ROS: no nausea, vomiting; no fever, chills; no melena, hematochezia; no claudication  PHYSICAL EXAM: BP 111/75  Pulse 90  Ht 5\' 3"  (1.6 m)  Wt 145 lb (65.772 kg)  BMI 25.69 kg/m2 GENERAL: 66-yo  female; NAD HEENT: NCAT, PERRLA, EOMI; sclera clear; no xanthelasma NECK: palpable bilateral carotid pulses, no bruits; no JVD; no TM LUNGS: CTA bilaterally CARDIAC: RRR (S1, S2); no significant murmurs; no rubs or gallops ABDOMEN: soft, non-tender; intact BS EXTREMETIES: no significant peripheral edema SKIN: warm/dry; no obvious rash/lesions MUSCULOSKELETAL: no joint deformity NEURO: no focal deficit; NL affect   EKG:    ASSESSMENT & PLAN:  Chronic systolic congestive heart failure Euvolemic by history and exam. Weight up 1 pound since last OV. Will uptitrate carvedilol to target dose 25 twice a day. Patient instructed to monitor her BP/HR at home, and to hold dose if SBP less than 100 or HR less than 55. We'll request most recent labs from Dr. Sherril Croon' office, regarding potassium level and renal function.  Ischemic cardiomyopathy Quiescent on current medication regimen.  Ventricular  tachycardia Status post ICD, followed by Dr. Johney Frame, with recent scheduled device clinic visit.  Hypertension Well-controlled on current medication regimen  Hyperlipidemia Followed by primary M.D. We'll request most recent lab results. Aggressive management recommended with target LDL 70 or less, if feasible.    Gene Khamiya Varin, PAC

## 2012-06-02 ENCOUNTER — Telehealth: Payer: Self-pay | Admitting: *Deleted

## 2012-06-02 NOTE — Telephone Encounter (Addendum)
Patient was informed to call and give a report on how dosage increase for carvedilol 25 mg twice daily after starting it. Patient said it made her feel sick with a nausea feeling and also it felt like her heart was beating funny. Patient took one on Sunday morning and then went back to previous dosage Sunday night which is 15.625 mg twice daily of what she had left over. Patient will need new prescriptions for previous dosage. Nurse advised patient that she would receive a return call on tomorrow with further instructions. Patient will use the 25 mg tablet to get dosage for today. Please advise.

## 2012-06-03 MED ORDER — CARVEDILOL 6.25 MG PO TABS: 12.5000 mg | ORAL_TABLET | Freq: Two times a day (BID) | ORAL | Status: DC

## 2012-06-03 MED ORDER — CARVEDILOL 3.125 MG PO TABS: 3.1250 mg | ORAL_TABLET | Freq: Two times a day (BID) | ORAL | Status: DC

## 2012-06-03 NOTE — Telephone Encounter (Signed)
Patient informed and verbalized understanding of plan. New prescriptions sent to pharmacy.

## 2012-06-03 NOTE — Telephone Encounter (Signed)
It appears that she was taking Coreg 15.625 mg bid, prior to my recommendation to further increase to the target dose of 25 bid. If she was unable to tolerate this, then we can continue her at the previous dose, as noted above, which consists of 12.5 mg and 3.125 mg tablets.

## 2012-07-13 ENCOUNTER — Ambulatory Visit (INDEPENDENT_AMBULATORY_CARE_PROVIDER_SITE_OTHER): Payer: Self-pay | Admitting: *Deleted

## 2012-07-13 DIAGNOSIS — Z95810 Presence of automatic (implantable) cardiac defibrillator: Secondary | ICD-10-CM

## 2012-07-13 DIAGNOSIS — I2589 Other forms of chronic ischemic heart disease: Secondary | ICD-10-CM

## 2012-07-13 DIAGNOSIS — I5022 Chronic systolic (congestive) heart failure: Secondary | ICD-10-CM

## 2012-07-13 DIAGNOSIS — I255 Ischemic cardiomyopathy: Secondary | ICD-10-CM

## 2012-07-13 DIAGNOSIS — I509 Heart failure, unspecified: Secondary | ICD-10-CM

## 2012-07-15 ENCOUNTER — Encounter: Payer: Self-pay | Admitting: Internal Medicine

## 2012-07-15 ENCOUNTER — Other Ambulatory Visit: Payer: Self-pay | Admitting: Internal Medicine

## 2012-07-15 DIAGNOSIS — I428 Other cardiomyopathies: Secondary | ICD-10-CM

## 2012-07-21 LAB — REMOTE ICD DEVICE
AL AMPLITUDE: 1.1 mv
AL THRESHOLD: 0.625 V
BAMS-0001: 170 {beats}/min
BATTERY VOLTAGE: 3.0443 V
FVT: 0
RV LEAD AMPLITUDE: 20 mv
RV LEAD IMPEDENCE ICD: 532 Ohm
RV LEAD THRESHOLD: 1.125 V
TOT-0001: 1
TZAT-0001ATACH: 1
TZAT-0001ATACH: 2
TZAT-0001FASTVT: 1
TZAT-0001SLOWVT: 1
TZAT-0001SLOWVT: 2
TZAT-0004ATACH: 6
TZAT-0004SLOWVT: 8
TZAT-0004SLOWVT: 8
TZAT-0011ATACH: 10 ms
TZAT-0012ATACH: 150 ms
TZAT-0012ATACH: 150 ms
TZAT-0012ATACH: 150 ms
TZAT-0012FASTVT: 200 ms
TZAT-0012SLOWVT: 200 ms
TZAT-0018ATACH: NEGATIVE
TZAT-0018ATACH: NEGATIVE
TZAT-0018FASTVT: NEGATIVE
TZAT-0020ATACH: 1.5 ms
TZAT-0020ATACH: 1.5 ms
TZAT-0020SLOWVT: 1.5 ms
TZAT-0020SLOWVT: 1.5 ms
TZON-0003SLOWVT: 350 ms
TZST-0001ATACH: 4
TZST-0001FASTVT: 2
TZST-0001FASTVT: 4
TZST-0001FASTVT: 6
TZST-0001SLOWVT: 3
TZST-0001SLOWVT: 6
TZST-0002ATACH: NEGATIVE
TZST-0002FASTVT: NEGATIVE
TZST-0003SLOWVT: 25 J
TZST-0003SLOWVT: 35 J
TZST-0003SLOWVT: 35 J
TZST-0003SLOWVT: 35 J
VF: 0

## 2012-07-27 ENCOUNTER — Encounter: Payer: Self-pay | Admitting: *Deleted

## 2012-08-01 ENCOUNTER — Other Ambulatory Visit: Payer: Self-pay | Admitting: Cardiovascular Disease

## 2012-08-06 ENCOUNTER — Encounter: Payer: Self-pay | Admitting: Physician Assistant

## 2012-09-08 ENCOUNTER — Other Ambulatory Visit: Payer: Self-pay | Admitting: Cardiology

## 2012-09-08 ENCOUNTER — Telehealth: Payer: Self-pay | Admitting: *Deleted

## 2012-09-08 NOTE — Telephone Encounter (Signed)
Patient was given new prescription for remeron 15 mg by PCP and want to know if its safe for her to take this with her cardiac medications. Nurse checked for interactions with other meds and found no interactions. Nurse informed her that message would be forwarded to MD.

## 2012-09-09 NOTE — Telephone Encounter (Signed)
..   Requested Prescriptions   Pending Prescriptions Disp Refills  . clopidogrel (PLAVIX) 75 MG tablet [Pharmacy Med Name: CLOPIDOGREL 75MG     TAB] 30 tablet 6    Sig: TAKE ONE TABLET BY MOUTH EVERY DAY

## 2012-09-09 NOTE — Telephone Encounter (Signed)
OK to take 

## 2012-09-24 ENCOUNTER — Encounter: Payer: Self-pay | Admitting: Physician Assistant

## 2012-10-20 ENCOUNTER — Encounter: Payer: Medicare HMO | Admitting: *Deleted

## 2012-10-23 ENCOUNTER — Ambulatory Visit: Payer: Self-pay | Admitting: Cardiology

## 2012-10-26 ENCOUNTER — Encounter: Payer: Self-pay | Admitting: *Deleted

## 2012-11-04 ENCOUNTER — Ambulatory Visit (INDEPENDENT_AMBULATORY_CARE_PROVIDER_SITE_OTHER): Payer: Medicare Other | Admitting: Cardiology

## 2012-11-04 ENCOUNTER — Encounter: Payer: Self-pay | Admitting: Cardiology

## 2012-11-04 VITALS — BP 90/50 | HR 81 | Ht 63.0 in | Wt 130.8 lb

## 2012-11-04 DIAGNOSIS — I059 Rheumatic mitral valve disease, unspecified: Secondary | ICD-10-CM

## 2012-11-04 DIAGNOSIS — I472 Ventricular tachycardia, unspecified: Secondary | ICD-10-CM

## 2012-11-04 DIAGNOSIS — Z720 Tobacco use: Secondary | ICD-10-CM

## 2012-11-04 DIAGNOSIS — I509 Heart failure, unspecified: Secondary | ICD-10-CM

## 2012-11-04 DIAGNOSIS — I2589 Other forms of chronic ischemic heart disease: Secondary | ICD-10-CM

## 2012-11-04 DIAGNOSIS — I2510 Atherosclerotic heart disease of native coronary artery without angina pectoris: Secondary | ICD-10-CM

## 2012-11-04 DIAGNOSIS — I255 Ischemic cardiomyopathy: Secondary | ICD-10-CM

## 2012-11-04 DIAGNOSIS — I340 Nonrheumatic mitral (valve) insufficiency: Secondary | ICD-10-CM

## 2012-11-04 DIAGNOSIS — F17200 Nicotine dependence, unspecified, uncomplicated: Secondary | ICD-10-CM

## 2012-11-04 DIAGNOSIS — I10 Essential (primary) hypertension: Secondary | ICD-10-CM

## 2012-11-04 DIAGNOSIS — I5022 Chronic systolic (congestive) heart failure: Secondary | ICD-10-CM

## 2012-11-04 NOTE — Patient Instructions (Addendum)
The current medical regimen is effective;  continue present plan and medications.  Follow up in 2 months with Dr Hochrein. 

## 2012-11-04 NOTE — Progress Notes (Signed)
HPI The patient presents for evaluation of CAD and ischemic cardiomyopathy.   This patient presents for followup of her ischemic cardiomyopathy. Recently she's had her dose of beta blocker reduced apparently because her blood pressure has been low. It is lower today than usual. She's had about a 30 pound weight loss over the past year and this has been worked up by Dr. Sherril Croon without clear etiology identified. She has been eating less which may be the explanation. This could also explain why her blood pressure might be lower. She's not having any new shortness of breath, PND or orthopnea. She's not having a new palpitations, presyncope or syncope. She has some mild ankle edema. However, this doesn't seem to be necessarily new either.   Allergies  Allergen Reactions  . Penicillins Hives  . Tetracyclines & Related     Current Outpatient Prescriptions  Medication Sig Dispense Refill  . aspirin 81 MG tablet Take 81 mg by mouth daily.        . carvedilol (COREG) 3.125 MG tablet Take 1 tablet (3.125 mg total) by mouth 2 (two) times daily.  180 tablet  3  . clopidogrel (PLAVIX) 75 MG tablet TAKE ONE TABLET BY MOUTH EVERY DAY  30 tablet  3  . losartan (COZAAR) 50 MG tablet TAKE ONE TABLET BY MOUTH EVERY DAY  30 tablet  0  . nitroGLYCERIN (NITROSTAT) 0.4 MG SL tablet Place 1 tablet (0.4 mg total) under the tongue every 5 (five) minutes as needed.  25 tablet  2  . simvastatin (ZOCOR) 40 MG tablet TAKE ONE TABLET BY MOUTH AT BEDTIME  30 tablet  1  . spironolactone (ALDACTONE) 25 MG tablet Take 1 tablet (25 mg total) by mouth daily.  30 tablet  6   No current facility-administered medications for this visit.    Past Medical History  Diagnosis Date  . Chronic systolic congestive heart failure   . Hypertension   . Tobacco abuse   . Coronary artery disease     anterior MI in 2010. LAD stent placement 2010. EF: 15%  . MR (mitral regurgitation)     moderate to severe  . LBBB (left bundle branch  block)     Past Surgical History  Procedure Laterality Date  . Cardiac defibrillator placement  2011  . Tonsillectomy and adenoidectomy    . Tubal ligation    . Cardiac catheterization  2010    LAD PCI and stent placement    ROS:  As stated in the HPI and negative for all other systems.   PHYSICAL EXAM BP 90/50  Pulse 81  Ht 5\' 3"  (1.6 m)  Wt 130 lb 12.8 oz (59.33 kg)  BMI 23.18 kg/m2 GENERAL:  Well appearing NECK:  No jugular venous distention, waveform within normal limits, carotid upstroke brisk and symmetric, no bruits, no thyromegaly LUNGS:  Clear to auscultation bilaterally BACK:  No CVA tenderness CHEST:  ICD pocket HEART:  PMI not displaced or sustained,S1 and S2 within normal limits, no S3, no S4, no clicks, no rubs, no murmurs ABD:  Flat, positive bowel sounds normal in frequency in pitch, no bruits, no rebound, no guarding, no midline pulsatile mass, no hepatomegaly, no splenomegaly EXT:  2 plus pulses upper and diminished lower, no edema, no cyanosis no clubbing   EKG:  NSR, LBBB rate 65. 11/04/2012   ASSESSMENT AND PLAN   Chronic systolic congestive heart failure -  She seems to be euvolemic.  At this point she is not tolerating  her previous dose of beta blocker and will remain on the current dose. I will try to titrate this in the future. We did call her primary care office to get her med list included on file.  Hypertension -  This is being managed in the context of treating her CHF   Tobacco abuse -  She says she's not smoking at all now. I congratulated her on this gain I will.  CAD (coronary artery disease) -  The patient has no new sypmtoms. No further cardiovascular testing is indicated. We will continue with aggressive risk reduction and meds as listed.

## 2012-11-06 ENCOUNTER — Ambulatory Visit (INDEPENDENT_AMBULATORY_CARE_PROVIDER_SITE_OTHER): Payer: Medicare Other | Admitting: *Deleted

## 2012-11-06 ENCOUNTER — Encounter: Payer: Self-pay | Admitting: Internal Medicine

## 2012-11-06 DIAGNOSIS — I509 Heart failure, unspecified: Secondary | ICD-10-CM

## 2012-11-06 DIAGNOSIS — I2589 Other forms of chronic ischemic heart disease: Secondary | ICD-10-CM

## 2012-11-06 DIAGNOSIS — I5022 Chronic systolic (congestive) heart failure: Secondary | ICD-10-CM

## 2012-11-06 DIAGNOSIS — I255 Ischemic cardiomyopathy: Secondary | ICD-10-CM

## 2012-11-06 DIAGNOSIS — Z95810 Presence of automatic (implantable) cardiac defibrillator: Secondary | ICD-10-CM

## 2012-11-11 LAB — REMOTE ICD DEVICE
AL AMPLITUDE: 1.5 mv
AL THRESHOLD: 0.625 V
ATRIAL PACING ICD: 31.52 pct
BAMS-0001: 170 {beats}/min
BATTERY VOLTAGE: 3.0375 V
FVT: 0
RV LEAD AMPLITUDE: 20 mv
RV LEAD IMPEDENCE ICD: 475 Ohm
RV LEAD THRESHOLD: 1.25 V
TZAT-0001ATACH: 2
TZAT-0001ATACH: 3
TZAT-0002ATACH: NEGATIVE
TZAT-0004ATACH: 15
TZAT-0004SLOWVT: 8
TZAT-0004SLOWVT: 8
TZAT-0011SLOWVT: 10 ms
TZAT-0011SLOWVT: 10 ms
TZAT-0012ATACH: 150 ms
TZAT-0012FASTVT: 200 ms
TZAT-0012SLOWVT: 200 ms
TZAT-0012SLOWVT: 200 ms
TZAT-0018ATACH: NEGATIVE
TZAT-0018ATACH: NEGATIVE
TZAT-0018ATACH: NEGATIVE
TZAT-0018FASTVT: NEGATIVE
TZAT-0019ATACH: 6 V
TZAT-0019FASTVT: 8 V
TZAT-0019SLOWVT: 8 V
TZAT-0019SLOWVT: 8 V
TZAT-0020ATACH: 1.5 ms
TZAT-0020FASTVT: 1.5 ms
TZAT-0020SLOWVT: 1.5 ms
TZAT-0020SLOWVT: 1.5 ms
TZON-0003ATACH: 350 ms
TZON-0003SLOWVT: 350 ms
TZON-0003VSLOWVT: 400 ms
TZON-0004VSLOWVT: 36
TZST-0001ATACH: 6
TZST-0001FASTVT: 2
TZST-0001FASTVT: 5
TZST-0001FASTVT: 6
TZST-0001SLOWVT: 5
TZST-0002ATACH: NEGATIVE
TZST-0002ATACH: NEGATIVE
TZST-0002FASTVT: NEGATIVE
TZST-0002FASTVT: NEGATIVE
TZST-0003SLOWVT: 25 J
TZST-0003SLOWVT: 35 J
VF: 0

## 2012-11-25 ENCOUNTER — Telehealth: Payer: Self-pay | Admitting: Cardiology

## 2012-11-25 NOTE — Telephone Encounter (Signed)
Feet swell during the day has been going on since June 11 and getting worse to he

## 2012-11-26 NOTE — Telephone Encounter (Signed)
Spoke with patient and she c/o more swelling in both ankles and feet than before. Patient said that one side was worse than the other. Patient said that all the swelling is resolved when she wakes up in the morning, and as the day goes on, the swelling comes back. Patient denies chest pain, dizziness or sob. Nurse gave patient an earlier appointment to discuss at next office visit and informed her to keep her legs elevated as much as possible and refrain from high sodium foods. Patient verbalized understanding of plan.

## 2012-12-04 ENCOUNTER — Ambulatory Visit (INDEPENDENT_AMBULATORY_CARE_PROVIDER_SITE_OTHER): Payer: Medicare Other | Admitting: *Deleted

## 2012-12-04 DIAGNOSIS — I428 Other cardiomyopathies: Secondary | ICD-10-CM

## 2012-12-04 LAB — ICD DEVICE OBSERVATION
AL AMPLITUDE: 2.4 mv
AL IMPEDENCE ICD: 418 Ohm
BATTERY VOLTAGE: 3.03 V
CHARGE TIME: 9.6 s
DEV-0020ICD: NEGATIVE
TZAT-0001ATACH: 1
TZAT-0001ATACH: 2
TZAT-0001SLOWVT: 1
TZAT-0001SLOWVT: 2
TZAT-0002ATACH: NEGATIVE
TZAT-0002FASTVT: NEGATIVE
TZAT-0004ATACH: 15
TZAT-0011ATACH: 10 ms
TZAT-0011SLOWVT: 10 ms
TZAT-0012ATACH: 150 ms
TZAT-0018ATACH: NEGATIVE
TZAT-0018SLOWVT: NEGATIVE
TZAT-0018SLOWVT: NEGATIVE
TZAT-0019ATACH: 6 V
TZAT-0019ATACH: 6 V
TZAT-0019SLOWVT: 8 V
TZAT-0019SLOWVT: 8 V
TZAT-0020ATACH: 1.5 ms
TZAT-0020ATACH: 1.5 ms
TZAT-0020SLOWVT: 1.5 ms
TZON-0003ATACH: 350 ms
TZON-0003VSLOWVT: 400 ms
TZON-0004VSLOWVT: 36
TZON-0005SLOWVT: 12
TZST-0001ATACH: 5
TZST-0001FASTVT: 3
TZST-0001FASTVT: 5
TZST-0001SLOWVT: 3
TZST-0001SLOWVT: 4
TZST-0001SLOWVT: 5
TZST-0001SLOWVT: 6
TZST-0002ATACH: NEGATIVE
TZST-0002FASTVT: NEGATIVE
TZST-0002FASTVT: NEGATIVE
TZST-0003SLOWVT: 35 J
VENTRICULAR PACING ICD: 99.9 pct

## 2012-12-04 NOTE — Progress Notes (Signed)
Device check in clinic for reprogramming atrial sensitivity due to FFRW. Reprogrammed atrial sensitivity from 0.30 to 0.60. [facility] 02-08-13 and November with JA in [place].

## 2012-12-11 ENCOUNTER — Encounter: Payer: Self-pay | Admitting: Internal Medicine

## 2012-12-11 ENCOUNTER — Encounter: Payer: Self-pay | Admitting: *Deleted

## 2012-12-22 ENCOUNTER — Other Ambulatory Visit: Payer: Self-pay | Admitting: Cardiology

## 2012-12-22 MED ORDER — SPIRONOLACTONE 25 MG PO TABS: 25.0000 mg | ORAL_TABLET | Freq: Every day | ORAL | Status: DC

## 2012-12-24 ENCOUNTER — Ambulatory Visit (INDEPENDENT_AMBULATORY_CARE_PROVIDER_SITE_OTHER): Payer: Medicare Other | Admitting: Cardiovascular Disease

## 2012-12-24 ENCOUNTER — Encounter: Payer: Self-pay | Admitting: Cardiovascular Disease

## 2012-12-24 VITALS — BP 93/65 | HR 80 | Ht 63.0 in | Wt 126.0 lb

## 2012-12-24 DIAGNOSIS — I255 Ischemic cardiomyopathy: Secondary | ICD-10-CM

## 2012-12-24 DIAGNOSIS — I10 Essential (primary) hypertension: Secondary | ICD-10-CM

## 2012-12-24 DIAGNOSIS — I509 Heart failure, unspecified: Secondary | ICD-10-CM

## 2012-12-24 DIAGNOSIS — I2589 Other forms of chronic ischemic heart disease: Secondary | ICD-10-CM

## 2012-12-24 DIAGNOSIS — E785 Hyperlipidemia, unspecified: Secondary | ICD-10-CM

## 2012-12-24 DIAGNOSIS — I2510 Atherosclerotic heart disease of native coronary artery without angina pectoris: Secondary | ICD-10-CM

## 2012-12-24 DIAGNOSIS — I5022 Chronic systolic (congestive) heart failure: Secondary | ICD-10-CM

## 2012-12-24 MED ORDER — FUROSEMIDE 20 MG PO TABS: 20.0000 mg | ORAL_TABLET | ORAL | Status: DC | PRN

## 2012-12-24 MED ORDER — POTASSIUM CHLORIDE ER 10 MEQ PO TBCR: 10.0000 meq | EXTENDED_RELEASE_TABLET | ORAL | Status: DC | PRN

## 2012-12-24 MED ORDER — CLOPIDOGREL BISULFATE 75 MG PO TABS: 75.0000 mg | ORAL_TABLET | Freq: Every day | ORAL | Status: DC

## 2012-12-24 NOTE — Progress Notes (Signed)
Patient ID: Tracy Mills, female   DOB: 1946-03-21, 67 y.o.   MRN: [id number]    SUBJECTIVE: Tracy Mills presents for a follow-up evaluation of CAD and ischemic cardiomyopathy. Recently she's had her dose of beta blocker reduced apparently because her blood pressure has been low. It is lower today than usual. She's had about a 30 pound weight loss over the past year and this has been worked up by Tracy Mills without clear etiology identified. She has been eating less which may be the explanation. She says she was never had a big appetite.  She's not having any new shortness of breath, PND or orthopnea. She's not having a new palpitations, presyncope or syncope.  She called earlier this month to the clinic, c/o more swelling in both ankles and feet than before. She said that one side was worse than the other and that all the swelling is resolved when she wakes up in the morning, and as the day goes on, the swelling comes back. She denies chest pain as well. She was informed by the nurse to keep her legs elevated as much as possible and refrain from high sodium foods.  She's been using Lasix prn which has alleviated these problems. She has not been taking potassium with this, and had some feet cramping on one occasion.  She denies lightheadedness and dizziness, and occasionally has fatigue, but not on a daily basis.  Her last stress test was in 2012, and showed large anterior and apical scar without ischemia, with severely reduced LV systolic function.     Allergies   Allergen  Reactions   .  Penicillins  Hives   .  Tetracyclines & Related         Filed Vitals:   12/24/12 0854  BP: 93/65  Pulse: 80  Height: 5\' 3"  (1.6 m)  Weight: 126 lb (57.153 kg)   BP: 93/65  Pulse: 80   PHYSICAL EXAM General: NAD Neck: No JVD, no thyromegaly or thyroid nodule.  Lungs: Clear to auscultation bilaterally with normal respiratory effort. CV: Nondisplaced PMI.  Heart regular S1/S2, no S3/S4, no  murmur.  No peripheral edema.  No carotid bruit.  Normal pedal pulses.  Abdomen: Soft, nontender, no hepatosplenomegaly, no distention.  Neurologic: Alert and oriented x 3.  Psych: Normal affect. Extremities: No clubbing or cyanosis.     LABS: Basic Metabolic Panel: No results found for this basename: NA, K, CL, CO2, GLUCOSE, BUN, CREATININE, CALCIUM, MG, PHOS,  in the last 72 hours Liver Function Tests: No results found for this basename: AST, ALT, ALKPHOS, BILITOT, PROT, ALBUMIN,  in the last 72 hours No results found for this basename: LIPASE, AMYLASE,  in the last 72 hours CBC: No results found for this basename: WBC, NEUTROABS, HGB, HCT, MCV, PLT,  in the last 72 hours Cardiac Enzymes: No results found for this basename: CKTOTAL, CKMB, CKMBINDEX, TROPONINI,  in the last 72 hours BNP: No components found with this basename: POCBNP,  D-Dimer: No results found for this basename: DDIMER,  in the last 72 hours Hemoglobin A1C: No results found for this basename: HGBA1C,  in the last 72 hours Fasting Lipid Panel: No results found for this basename: CHOL, HDL, LDLCALC, TRIG, CHOLHDL, LDLDIRECT,  in the last 72 hours Thyroid Function Tests: No results found for this basename: TSH, T4TOTAL, FREET3, T3FREE, THYROIDAB,  in the last 72 hours Anemia Panel: No results found for this basename: VITAMINB12, FOLATE, FERRITIN, TIBC, IRON, RETICCTPCT,  in the last  72 hours  RADIOLOGY: No results found.  Echo (February 2013): Left ventricle: The cavity size was severely dilated. Systolic function was severely reduced. The estimated ejection fraction was in the range of 15% to 20%. Diffuse hypokinesis. There is akinesis of the anteroseptal and apical myocardium. Doppler parameters are consistent with abnormal left ventricular relaxation (grade 1 diastolic dysfunction). - Ventricular septum: Septal motion showed paradox. - Aortic valve: Trileaflet; mildly thickened leaflets. Valve area:  1.97cm^2(VTI). Valve area: 2.34cm^2 (Vmax). - Mitral valve: Mildly thickened leaflets. Moderate regurgitation. Central jet, perhaps related to annular dilatation. Some views suggest moderate to severe regurgitation. There is no PISA calculation. - Left atrium: The atrium was mildly dilated. - Right ventricle: Pacer wire or catheter noted in right ventricle. - Tricuspid valve: Trivial regurgitation. Peak RV-RA gradient: 21mm Hg (S). - Pericardium, extracardiac: There was no pericardial effusion. Impressions:  - Severe left ventricular chamber dilatation with diffuse hypokinesis, paradoxical septal motion, and essentially akinesis of the anteroseptal wall, LVEF 15-20%. There is grade 1 diastolic dysfunction. Mitral leaflets are mildly thickened and there is a moderate, central regurgitant jet, perhaps related to annular dilatation. Some views suggest moderate to severe mitral regurgitation, although no PISA was calculated. Device wire noted in right heart.      ASSESSMENT AND PLAN: Chronic systolic congestive heart failure  Euvolemic by history and exam. I've instructed her to take Lasix prn but to take potassium with it, to attenuate potential muscle cramps.   Ischemic cardiomyopathy  Quiescent on current medication regimen. Stress test in 2012 showed only myocardial scar, without ischemia. She will stop her Plavix once this prescription runs out, but continue ASA.  Ventricular tachycardia  Status post ICD, followed by Tracy Mills.  Hypertension  She is actually hypotensive today, but appears to be asymptomatic. I will hold off on making any adjustments.  Hyperlipidemia  Followed by primary M.D. We'll request most recent lab results. Aggressive management recommended with target LDL 70 or less, if feasible.    Tracy Mills, M.D., F.A.C.C.

## 2012-12-24 NOTE — Patient Instructions (Signed)
   Take your Lasix 20mg  on as needed basis for edema or leg swelling - refill sent to pharm   Take your Potassium on the days that you have to take the Lasix - please have pharmacy contact office for refill request when needed  Doreatha Martin current supply of Plavix, then STOP Continue all other current medications. Follow up in  3 months

## 2012-12-29 ENCOUNTER — Ambulatory Visit: Payer: Self-pay | Admitting: Cardiovascular Disease

## 2012-12-30 ENCOUNTER — Telehealth: Payer: Self-pay | Admitting: Cardiology

## 2012-12-30 NOTE — Telephone Encounter (Signed)
Pharmacy sent fax to authorize refill for clopidgerl. Pt was informed at OV on 12-24-2012 that when current supply of this medication was finished to not take anymore. Informed pharmacy of D/C.

## 2013-01-05 ENCOUNTER — Other Ambulatory Visit: Payer: Self-pay | Admitting: *Deleted

## 2013-01-06 ENCOUNTER — Ambulatory Visit: Payer: Self-pay | Admitting: Cardiovascular Disease

## 2013-01-06 ENCOUNTER — Ambulatory Visit: Payer: Self-pay | Admitting: Cardiology

## 2013-01-12 ENCOUNTER — Other Ambulatory Visit: Payer: Self-pay | Admitting: Cardiology

## 2013-01-12 MED ORDER — LOSARTAN POTASSIUM 50 MG PO TABS: 50.0000 mg | ORAL_TABLET | Freq: Every day | ORAL | Status: DC

## 2013-01-27 ENCOUNTER — Telehealth: Payer: Self-pay | Admitting: Cardiology

## 2013-01-27 NOTE — Telephone Encounter (Signed)
Received a refill request for Plavix 75 mg. When patient was seen on 12-24-2012 by Dr. Purvis Sheffield instructed pt to finish current supply of medication then stop. Called pt to confirm if she did stop taking medications. Pt confirmed this. Then called pharmacy and informed of medication being D/C.

## 2013-02-08 ENCOUNTER — Ambulatory Visit (INDEPENDENT_AMBULATORY_CARE_PROVIDER_SITE_OTHER): Payer: Medicare Other | Admitting: *Deleted

## 2013-02-08 DIAGNOSIS — Z95810 Presence of automatic (implantable) cardiac defibrillator: Secondary | ICD-10-CM

## 2013-02-08 DIAGNOSIS — I509 Heart failure, unspecified: Secondary | ICD-10-CM

## 2013-02-08 DIAGNOSIS — I2589 Other forms of chronic ischemic heart disease: Secondary | ICD-10-CM

## 2013-02-08 DIAGNOSIS — I255 Ischemic cardiomyopathy: Secondary | ICD-10-CM

## 2013-02-08 DIAGNOSIS — I5022 Chronic systolic (congestive) heart failure: Secondary | ICD-10-CM

## 2013-02-10 LAB — REMOTE ICD DEVICE
AL THRESHOLD: 0.625 V
BAMS-0001: 170 {beats}/min
CHARGE TIME: 9.789 s
DEV-0020ICD: NEGATIVE
FVT: 0
PACEART VT: 0
TOT-0001: 1
TOT-0002: 1
TZAT-0001ATACH: 1
TZAT-0001ATACH: 2
TZAT-0001FASTVT: 1
TZAT-0001SLOWVT: 1
TZAT-0001SLOWVT: 2
TZAT-0002FASTVT: NEGATIVE
TZAT-0004ATACH: 15
TZAT-0004SLOWVT: 8
TZAT-0004SLOWVT: 8
TZAT-0005SLOWVT: 84 pct
TZAT-0005SLOWVT: 91 pct
TZAT-0011ATACH: 10 ms
TZAT-0011ATACH: 10 ms
TZAT-0011SLOWVT: 10 ms
TZAT-0011SLOWVT: 10 ms
TZAT-0012ATACH: 150 ms
TZAT-0012ATACH: 150 ms
TZAT-0012ATACH: 150 ms
TZAT-0012SLOWVT: 200 ms
TZAT-0012SLOWVT: 200 ms
TZAT-0013SLOWVT: 3
TZAT-0013SLOWVT: 3
TZAT-0018SLOWVT: NEGATIVE
TZAT-0018SLOWVT: NEGATIVE
TZAT-0019ATACH: 6 V
TZAT-0019FASTVT: 8 V
TZAT-0020ATACH: 1.5 ms
TZAT-0020ATACH: 1.5 ms
TZON-0003ATACH: 350 ms
TZON-0003SLOWVT: 350 ms
TZON-0004SLOWVT: 28
TZON-0004VSLOWVT: 36
TZON-0005SLOWVT: 12
TZST-0001ATACH: 4
TZST-0001FASTVT: 2
TZST-0001FASTVT: 3
TZST-0001SLOWVT: 4
TZST-0001SLOWVT: 6
TZST-0002FASTVT: NEGATIVE
TZST-0002FASTVT: NEGATIVE
TZST-0002FASTVT: NEGATIVE
TZST-0003SLOWVT: 35 J
VENTRICULAR PACING ICD: 0.04 pct

## 2013-02-19 ENCOUNTER — Encounter: Payer: Self-pay | Admitting: *Deleted

## 2013-02-22 ENCOUNTER — Ambulatory Visit (INDEPENDENT_AMBULATORY_CARE_PROVIDER_SITE_OTHER): Payer: Medicare Other | Admitting: Cardiovascular Disease

## 2013-02-22 ENCOUNTER — Encounter: Payer: Self-pay | Admitting: Cardiovascular Disease

## 2013-02-22 VITALS — BP 94/65 | HR 77 | Ht 63.0 in | Wt 132.0 lb

## 2013-02-22 DIAGNOSIS — I10 Essential (primary) hypertension: Secondary | ICD-10-CM

## 2013-02-22 DIAGNOSIS — R600 Localized edema: Secondary | ICD-10-CM

## 2013-02-22 DIAGNOSIS — I509 Heart failure, unspecified: Secondary | ICD-10-CM

## 2013-02-22 DIAGNOSIS — I2589 Other forms of chronic ischemic heart disease: Secondary | ICD-10-CM

## 2013-02-22 DIAGNOSIS — R609 Edema, unspecified: Secondary | ICD-10-CM

## 2013-02-22 DIAGNOSIS — E785 Hyperlipidemia, unspecified: Secondary | ICD-10-CM

## 2013-02-22 DIAGNOSIS — I472 Ventricular tachycardia: Secondary | ICD-10-CM

## 2013-02-22 DIAGNOSIS — I255 Ischemic cardiomyopathy: Secondary | ICD-10-CM

## 2013-02-22 DIAGNOSIS — I2510 Atherosclerotic heart disease of native coronary artery without angina pectoris: Secondary | ICD-10-CM

## 2013-02-22 DIAGNOSIS — I5022 Chronic systolic (congestive) heart failure: Secondary | ICD-10-CM

## 2013-02-22 MED ORDER — FUROSEMIDE 40 MG PO TABS: 40.0000 mg | ORAL_TABLET | Freq: Every day | ORAL | Status: DC

## 2013-02-22 NOTE — Patient Instructions (Signed)
   Increase Lasix to 40mg  twice a day  X 5 days only, then 40mg  daily thereafter - new sent to pharm Continue all other medications.   Lab for BMET - due around 03/01/2013 Office will contact with results via phone or letter.   Follow up in  2-3 weeks

## 2013-02-22 NOTE — Progress Notes (Signed)
Patient ID: Tracy Mills, female   DOB: 09-10-1945, 67 y.o.   MRN: [id number]   SUBJECTIVE: Mrs. Birchler has had elevated Optivol readings recently. She denies orthopnea per se, as well as PND. She also denies shortness of breath. She has had more right leg swelling recently, and has never had any significant left leg swelling. She denies chest pain, dizziness, and syncope. She denies significant sodium intake, but admits to eating a lot of chocolate lately.  She's been taking Lasix 20 mg daily for the past month, along with 25 mg Aldactone daily. Her prescription ran out and has been taking an older bottle of pills.  She recently received a scale to weigh herself from her insurance company two days ago, and says it was 129.8 lbs today. In our office, it is 132 lbs.   Allergies  Allergen Reactions  . Penicillins Hives  . Tetracyclines & Related     Current Outpatient Prescriptions  Medication Sig Dispense Refill  . aspirin 81 MG tablet Take 81 mg by mouth daily.        . carvedilol (COREG) 3.125 MG tablet Take 1 tablet (3.125 mg total) by mouth 2 (two) times daily.  180 tablet  3  . furosemide (LASIX) 20 MG tablet Take 1 tablet (20 mg total) by mouth as needed for edema.  30 tablet  3  . losartan (COZAAR) 50 MG tablet Take 1 tablet (50 mg total) by mouth daily.  30 tablet  3  . mirtazapine (REMERON) 15 MG tablet Take 15 mg by mouth as needed.      . nitroGLYCERIN (NITROSTAT) 0.4 MG SL tablet Place 1 tablet (0.4 mg total) under the tongue every 5 (five) minutes as needed.  25 tablet  2  . potassium chloride (K-DUR) 10 MEQ tablet Take 1 tablet (10 mEq total) by mouth as needed. (TAKE ONE TAB ON THE DAYS THAT YOU TAKE YOUR LASIX)      . simvastatin (ZOCOR) 40 MG tablet TAKE ONE TABLET BY MOUTH AT BEDTIME  30 tablet  1  . spironolactone (ALDACTONE) 25 MG tablet Take 1 tablet (25 mg total) by mouth daily.  30 tablet  6   No current facility-administered medications for this visit.    Past  Medical History  Diagnosis Date  . Chronic systolic congestive heart failure   . Hypertension   . Tobacco abuse   . Coronary artery disease     anterior MI in 2010. LAD stent placement 2010. EF: 15%  . MR (mitral regurgitation)     moderate to severe  . LBBB (left bundle branch block)     Past Surgical History  Procedure Laterality Date  . Cardiac defibrillator placement  2011  . Tonsillectomy and adenoidectomy    . Tubal ligation    . Cardiac catheterization  2010    LAD PCI and stent placement    History   Social History  . Marital Status: Single    Spouse Name: N/A    Number of Children: N/A  . Years of Education: N/A   Occupational History  . RETIRED     retired bus Office manager office   Social History Main Topics  . Smoking status: Current Some Day Smoker -- 0.20 packs/day for 40 years    Types: Cigarettes  . Smokeless tobacco: Never Used     Comment: smokes socially/ smokes 2 cigarettes per month, trying to quit  . Alcohol Use: Yes     Comment: occasional alcohol  use  . Drug Use: No  . Sexual Activity: Not on file   Other Topics Concern  . Not on file   Social History Narrative   Moved to [place] from [place].  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  Lungs: Clear to auscultation bilaterally with normal respiratory effort. CV: Nondisplaced PMI.  Heart regular S1/S2, no S3/S4, no murmur. 1+ pitting edema in right leg/peri-ankle, trace edema of left leg.  No carotid bruit.  Normal pedal pulses.  Abdomen: Soft, nontender, no hepatosplenomegaly, no distention.  Neurologic: Alert and oriented x 3.  Psych: Normal affect. Extremities: No clubbing or cyanosis.   ECG: reviewed and available in electronic  records.      ASSESSMENT AND PLAN: Chronic systolic congestive heart failure  I will increase Lasix to 40 mg bid x next 5 days, then reduce it to 40 mg daily and check a BMP. Will need to follow renal function and potassium closely.  Ischemic cardiomyopathy  Quiescent on current medication regimen. Stress test in 2012 showed only myocardial scar, without ischemia. She is taking ASA 81 mg daily. I will hold off on an ischemic evaluation, but would consider one in the near future if she continues to experience leg swelling or develops shortness of breath.  Ventricular tachycardia  Status post ICD, followed by Dr. Johney Frame.   Hypertension  She is slightly hypotensive today, but appears to be asymptomatic.  Hyperlipidemia  Followed by primary M.D. We'll request most recent lab results.      Prentice Docker, M.D., F.A.C.C.

## 2013-02-27 ENCOUNTER — Encounter: Payer: Self-pay | Admitting: Internal Medicine

## 2013-03-12 ENCOUNTER — Encounter: Payer: Self-pay | Admitting: Cardiovascular Disease

## 2013-03-12 ENCOUNTER — Ambulatory Visit (INDEPENDENT_AMBULATORY_CARE_PROVIDER_SITE_OTHER): Payer: Medicare Other | Admitting: Cardiovascular Disease

## 2013-03-12 VITALS — BP 103/70 | HR 84 | Ht 63.0 in | Wt 126.0 lb

## 2013-03-12 DIAGNOSIS — I2589 Other forms of chronic ischemic heart disease: Secondary | ICD-10-CM

## 2013-03-12 DIAGNOSIS — I059 Rheumatic mitral valve disease, unspecified: Secondary | ICD-10-CM

## 2013-03-12 DIAGNOSIS — E785 Hyperlipidemia, unspecified: Secondary | ICD-10-CM

## 2013-03-12 DIAGNOSIS — R109 Unspecified abdominal pain: Secondary | ICD-10-CM

## 2013-03-12 DIAGNOSIS — I10 Essential (primary) hypertension: Secondary | ICD-10-CM

## 2013-03-12 DIAGNOSIS — I340 Nonrheumatic mitral (valve) insufficiency: Secondary | ICD-10-CM

## 2013-03-12 DIAGNOSIS — I2510 Atherosclerotic heart disease of native coronary artery without angina pectoris: Secondary | ICD-10-CM

## 2013-03-12 DIAGNOSIS — I5022 Chronic systolic (congestive) heart failure: Secondary | ICD-10-CM

## 2013-03-12 DIAGNOSIS — I509 Heart failure, unspecified: Secondary | ICD-10-CM

## 2013-03-12 DIAGNOSIS — I255 Ischemic cardiomyopathy: Secondary | ICD-10-CM

## 2013-03-12 MED ORDER — NITROGLYCERIN 0.4 MG SL SUBL: 0.4000 mg | SUBLINGUAL_TABLET | SUBLINGUAL | Status: DC | PRN

## 2013-03-12 NOTE — Patient Instructions (Signed)
Continue all current medications. Follow up in  3-4 months.  

## 2013-03-12 NOTE — Progress Notes (Signed)
Patient ID: Tracy Mills, female   DOB: [birth year], 67 y.o.   MRN: [id number]      SUBJECTIVE: Tracy Mills has been doing well, and her right ankle swelling has resolved. She denies chest pain and shortness of breath, as well as dizziness and palpitations. She has had some chronic left flank pain, but denies dysuria and fevers. Her labs showed BUN 14, creatinine 1.02, potassium 3.9. She is now on Lasix 40 mg daily and feels well.   Allergies  Allergen Reactions  . Penicillins Hives  . Tetracyclines & Related     Current Outpatient Prescriptions  Medication Sig Dispense Refill  . aspirin 81 MG tablet Take 81 mg by mouth daily.        . carvedilol (COREG) 3.125 MG tablet Take 1 tablet (3.125 mg total) by mouth 2 (two) times daily.  180 tablet  3  . furosemide (LASIX) 40 MG tablet Take 1 tablet (40 mg total) by mouth daily.  30 tablet  6  . losartan (COZAAR) 50 MG tablet Take 1 tablet (50 mg total) by mouth daily.  30 tablet  3  . mirtazapine (REMERON) 15 MG tablet Take 15 mg by mouth as needed.      . nitroGLYCERIN (NITROSTAT) 0.4 MG SL tablet Place 1 tablet (0.4 mg total) under the tongue every 5 (five) minutes as needed.  25 tablet  2  . potassium chloride (K-DUR) 10 MEQ tablet Take 1 tablet (10 mEq total) by mouth as needed. (TAKE ONE TAB ON THE DAYS THAT YOU TAKE YOUR LASIX)      . simvastatin (ZOCOR) 40 MG tablet TAKE ONE TABLET BY MOUTH AT BEDTIME  30 tablet  1  . spironolactone (ALDACTONE) 25 MG tablet Take 1 tablet (25 mg total) by mouth daily.  30 tablet  6   No current facility-administered medications for this visit.    Past Medical History  Diagnosis Date  . Chronic systolic congestive heart failure   . Hypertension   . Tobacco abuse   . Coronary artery disease     anterior MI in 2010. LAD stent placement 2010. EF: 15%  . MR (mitral regurgitation)     moderate to severe  . LBBB (left bundle branch block)     Past Surgical History  Procedure Laterality Date  .  Cardiac defibrillator placement  2011  . Tonsillectomy and adenoidectomy    . Tubal ligation    . Cardiac catheterization  2010    LAD PCI and stent placement    History   Social History  . Marital Status: Single    Spouse Name: N/A    Number of Children: N/A  . Years of Education: N/A   Occupational History  . RETIRED     retired bus Office manager office   Social History Main Topics  . Smoking status: Current Some Day Smoker -- 0.20 packs/day for 40 years    Types: Cigarettes  . Smokeless tobacco: Never Used     Comment: smokes socially/ smokes 2 cigarettes per month, trying to quit  . Alcohol Use: Yes     Comment: occasional alcohol use  . Drug Use: No  . Sexual Activity: Not on file   Other Topics Concern  . Not on file   Social History Narrative   Moved to [place] from [place].  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  Lungs: Clear to auscultation bilaterally with normal respiratory effort. CV: Nondisplaced PMI.  Heart regular S1/S2, no S3/S4, no murmur.  No peripheral edema.  No carotid bruit.  Normal pedal pulses.  Abdomen: Soft, nontender, no hepatosplenomegaly, no distention.  Neurologic: Alert and oriented x 3.  Psych: Normal affect. Extremities: No clubbing or cyanosis.   ECG: reviewed and available in electronic records.      ASSESSMENT AND PLAN: Chronic systolic congestive heart failure  Continue Lasix 40 mg daily for maintenance. She is compensated today. Will need to continue to follow renal function and potassium closely.   Ischemic cardiomyopathy  Quiescent on current medication regimen. Stress test in 2012 showed only myocardial scar, without ischemia. She is taking ASA 81 mg daily. I will hold off on an  ischemic evaluation, but would consider one in the near future if she continues to experience leg swelling or develops shortness of breath.   Ventricular tachycardia  Status post ICD, followed by Dr. Johney Frame.   Hypertension  She is slightly hypotensive today, but appears to be asymptomatic.   Hyperlipidemia  Followed by primary M.D. We'll request most recent lab results.   Left flank pain: No tenderness to palpation, no fevers or dysuria. Will defer to PCP for workup, but does not appear to clinically have UTI or renal issues.      Prentice Docker, M.D., F.A.C.C.

## 2013-04-05 ENCOUNTER — Ambulatory Visit: Payer: Medicare Other | Admitting: Cardiovascular Disease

## 2013-04-07 ENCOUNTER — Encounter: Payer: Medicare Other | Admitting: Internal Medicine

## 2013-04-07 ENCOUNTER — Encounter: Payer: Self-pay | Admitting: Internal Medicine

## 2013-04-07 ENCOUNTER — Ambulatory Visit (INDEPENDENT_AMBULATORY_CARE_PROVIDER_SITE_OTHER): Payer: Medicare Other | Admitting: Internal Medicine

## 2013-04-07 VITALS — BP 106/75 | HR 89 | Ht 61.0 in | Wt 134.0 lb

## 2013-04-07 DIAGNOSIS — I5022 Chronic systolic (congestive) heart failure: Secondary | ICD-10-CM

## 2013-04-07 DIAGNOSIS — F17200 Nicotine dependence, unspecified, uncomplicated: Secondary | ICD-10-CM

## 2013-04-07 DIAGNOSIS — I255 Ischemic cardiomyopathy: Secondary | ICD-10-CM

## 2013-04-07 DIAGNOSIS — I472 Ventricular tachycardia, unspecified: Secondary | ICD-10-CM

## 2013-04-07 DIAGNOSIS — Z720 Tobacco use: Secondary | ICD-10-CM

## 2013-04-07 DIAGNOSIS — I509 Heart failure, unspecified: Secondary | ICD-10-CM

## 2013-04-07 DIAGNOSIS — I2589 Other forms of chronic ischemic heart disease: Secondary | ICD-10-CM

## 2013-04-07 LAB — MDC_IDC_ENUM_SESS_TYPE_INCLINIC
Lead Channel Impedance Value: 418 Ohm
Lead Channel Impedance Value: 437 Ohm
Lead Channel Pacing Threshold Amplitude: 1.25 V
Lead Channel Pacing Threshold Pulse Width: 0.4 ms
Lead Channel Pacing Threshold Pulse Width: 0.4 ms
Lead Channel Setting Pacing Amplitude: 2 V
Lead Channel Setting Pacing Pulse Width: 0.4 ms
Zone Setting Detection Interval: 300 ms
Zone Setting Detection Interval: 350 ms
Zone Setting Detection Interval: 400 ms

## 2013-04-07 MED ORDER — FUROSEMIDE 40 MG PO TABS: 40.0000 mg | ORAL_TABLET | Freq: Every day | ORAL | Status: DC

## 2013-04-07 NOTE — Patient Instructions (Signed)
Continue all current medications. Carelink remote checks every 3 months Your physician wants you to follow up in:  1 year.  You will receive a reminder letter in the mail one-two months in advance.  If you don't receive a letter, please call our office to schedule the follow up appointment - [facility]

## 2013-04-09 ENCOUNTER — Telehealth: Payer: Self-pay | Admitting: *Deleted

## 2013-04-09 ENCOUNTER — Encounter: Payer: Self-pay | Admitting: Internal Medicine

## 2013-04-09 NOTE — Progress Notes (Signed)
PCP: Ignatius Specking., MD Primary Cardiologist:  Dr Gwenlyn Found Tracy Mills is a 67 y.o. female who presents today for routine electrophysiology followup.  Since last being seen in our clinic, the patient reports doing very well.  She does have fatigue and decreased exercise tolerance. Today, she denies symptoms of palpitations, chest pain, shortness of breath at rest, lower extremity edema, dizziness, presyncope, syncope, or ICD shocks.  The patient is otherwise without complaint today.   Past Medical History  Diagnosis Date  . Chronic systolic congestive heart failure   . Hypertension   . Tobacco abuse   . Coronary artery disease     anterior MI in 2010. LAD stent placement 2010. EF: 15%  . MR (mitral regurgitation)     moderate to severe  . LBBB (left bundle branch block)   . Ischemic cardiomyopathy    Past Surgical History  Procedure Laterality Date  . Cardiac defibrillator placement  06/14/09    MDT [address]  . Tonsillectomy and adenoidectomy    . Tubal ligation    . Cardiac catheterization  2010    LAD PCI and stent placement    Current Outpatient Prescriptions  Medication Sig Dispense Refill  . aspirin 81 MG tablet Take 81 mg by mouth daily.        . carvedilol (COREG) 3.125 MG tablet Take 1 tablet (3.125 mg total) by mouth 2 (two) times daily.  180 tablet  3  . furosemide (LASIX) 40 MG tablet Take 1 tablet (40 mg total) by mouth daily.  30 tablet  6  . losartan (COZAAR) 50 MG tablet Take 1 tablet (50 mg total) by mouth daily.  30 tablet  3  . mirtazapine (REMERON) 15 MG tablet Take 15 mg by mouth as needed.      . nitroGLYCERIN (NITROSTAT) 0.4 MG SL tablet Place 1 tablet (0.4 mg total) under the tongue every 5 (five) minutes as needed.  25 tablet  3  . potassium chloride (K-DUR) 10 MEQ tablet Take 1 tablet (10 mEq total) by mouth as needed. (TAKE ONE TAB ON THE DAYS THAT YOU TAKE YOUR LASIX)      . simvastatin (ZOCOR) 40 MG tablet TAKE ONE TABLET BY MOUTH AT BEDTIME  30  tablet  1  . spironolactone (ALDACTONE) 25 MG tablet Take 1 tablet (25 mg total) by mouth daily.  30 tablet  6   No current facility-administered medications for this visit.    Physical Exam: Filed Vitals:   04/07/13 0804  BP: 106/75  Pulse: 89  Height: 5\' 1"  (1.549 m)  Weight: 134 lb (60.782 kg)    GEN- The patient is well appearing, alert and oriented x 3 today.   Head- normocephalic, atraumatic Eyes-  Sclera clear, conjunctiva pink Ears- hearing intact Oropharynx- clear Lungs- Clear to ausculation bilaterally, normal work of breathing Chest- ICD pocket is well healed Heart- Regular rate and rhythm, no murmurs, rubs or gallops, PMI not laterally displaced GI- soft, NT, ND, + BS Extremities- no clubbing, cyanosis, or edema  ICD interrogation- reviewed in detail today,  See PACEART report ekgs are reviewed and reveal LBBB  Assessment and Plan:  1.  Chronic systolic dysfunction euvolemic today She has NYHA Class III CHF and a LBBB.  EF 15-20% 2/13 by echo (reviewed today) Stable on an appropriate medical regimen Normal ICD function See Pace Art report No changes today I had a long discussion with her regarding device upgrade to CRT-D device.  I do think  that she would receive clincal benefit from CRT.  At this time, she meets criteria for ICD upgrade to CRT-D.  Risks, benefits, alternatives to CT upgrade were discussed in detail with the patient today. The patient  understands that the risks include but are not limited to bleeding, infection, pneumothorax, perforation, tamponade, vascular damage, renal failure, MI, stroke, death,and lead dislodgement and wishes to further contemplate this options.  She thinks that she may be more interested after the holidays.  She will contact my office if she decides to proceed.  2. VT- previous NSVT Normal ICD function.  3. Tobacco Cessation is advised She is not ready to quit  carelink every 3 months Return to device clinic in 1  year

## 2013-04-09 NOTE — Telephone Encounter (Signed)
Spoke with patient regarding CRT upgrade.  She is not willing to schedule at this time.  She is requesting that we call her back at the first of the year.   I will call back Jan 2015 to follow up.  Gypsy Balsam, RN, BSN 04/09/2013 3:31 PM

## 2013-04-27 ENCOUNTER — Ambulatory Visit (INDEPENDENT_AMBULATORY_CARE_PROVIDER_SITE_OTHER): Payer: Medicare Other | Admitting: *Deleted

## 2013-04-27 DIAGNOSIS — I509 Heart failure, unspecified: Secondary | ICD-10-CM

## 2013-04-27 DIAGNOSIS — I5022 Chronic systolic (congestive) heart failure: Secondary | ICD-10-CM

## 2013-04-27 DIAGNOSIS — Z95810 Presence of automatic (implantable) cardiac defibrillator: Secondary | ICD-10-CM

## 2013-04-27 NOTE — Progress Notes (Signed)
EPIC Encounter for ICM Monitoring  Patient Name: Tracy Mills is a 67 y.o. female Date: 04/27/2013 Primary Care Physican: Ignatius Specking., MD Primary Cardiologist: Purvis Sheffield Electrophysiologist: [facility] Dry Weight: 126 lbs       In the past month, have you:  1. Gained more than 2 pounds in a day or more than 5 pounds in a week? No- take extra lasix PRN  2. Had changes in your medications (with verification of current medications)? no  3. Had more shortness of breath than is usual for you? no  4. Limited your activity because of shortness of breath? no  5. Not been able to sleep because of shortness of breath? No- 3 pillow orthopnea (chronic)  6. Had increased swelling in your feet or ankles? no  7. Had symptoms of dehydration (dizziness, dry mouth, increased thirst, decreased urine output) no  8. Had changes in sodium restriction? no  9. Been compliant with medication? Yes   ICM trend:   Follow-up plan: ICM clinic phone appointment on 05/31/13  Copy of note sent to patient's primary care physician, primary cardiologist, and device following physician.  Sherri Rad, RN, BSN 04/27/2013 10:52 AM

## 2013-05-31 ENCOUNTER — Ambulatory Visit (INDEPENDENT_AMBULATORY_CARE_PROVIDER_SITE_OTHER): Payer: Medicare HMO | Admitting: *Deleted

## 2013-05-31 DIAGNOSIS — I5022 Chronic systolic (congestive) heart failure: Secondary | ICD-10-CM

## 2013-05-31 DIAGNOSIS — Z95810 Presence of automatic (implantable) cardiac defibrillator: Secondary | ICD-10-CM

## 2013-05-31 DIAGNOSIS — I509 Heart failure, unspecified: Secondary | ICD-10-CM

## 2013-05-31 NOTE — Progress Notes (Signed)
EPIC Encounter for ICM Monitoring  Patient Name: Tracy Mills is a 68 y.o. female Date: 05/31/2013 Primary Care Physican: Ignatius Specking., MD Primary Cardiologist: Purvis Sheffield  Electrophysiologist: Allred Dry Weight: 126 lbs       In the past month, have you:  1. Gained more than 2 pounds in a day or more than 5 pounds in a week? No (patient actually complains of a steady decrease in her weight over the last 2 years from about 160 lbs. She is concerned about the steady decline and will follow up with Dr. Sherril Croon about this.)  2. Had changes in your medications (with verification of current medications)? no  3. Had more shortness of breath than is usual for you? no  4. Limited your activity because of shortness of breath? no  5. Not been able to sleep because of shortness of breath? no  6. Had increased swelling in your feet or ankles? no  7. Had symptoms of dehydration (dizziness, dry mouth, increased thirst, decreased urine output) no  8. Had changes in sodium restriction? no  9. Been compliant with medication? Yes        ** Patient is still considering upgrade of her device to a CRT, but has not made a decision about this yet. **  ICM trend:   Follow-up plan: ICM clinic phone appointment : 07/08/13  Copy of note sent to patient's primary care physician, primary cardiologist, and device following physician.  Sherri Rad, RN, BSN 05/31/2013 10:06 AM

## 2013-07-08 ENCOUNTER — Telehealth: Payer: Self-pay | Admitting: Internal Medicine

## 2013-07-08 ENCOUNTER — Ambulatory Visit (INDEPENDENT_AMBULATORY_CARE_PROVIDER_SITE_OTHER): Payer: Medicare HMO | Admitting: *Deleted

## 2013-07-08 ENCOUNTER — Encounter: Payer: Self-pay | Admitting: Internal Medicine

## 2013-07-08 DIAGNOSIS — I4729 Other ventricular tachycardia: Secondary | ICD-10-CM

## 2013-07-08 DIAGNOSIS — I472 Ventricular tachycardia, unspecified: Secondary | ICD-10-CM

## 2013-07-08 DIAGNOSIS — I509 Heart failure, unspecified: Secondary | ICD-10-CM

## 2013-07-08 DIAGNOSIS — I5022 Chronic systolic (congestive) heart failure: Secondary | ICD-10-CM

## 2013-07-08 LAB — MDC_IDC_ENUM_SESS_TYPE_REMOTE
Brady Statistic AP VP Percent: 0 %
Brady Statistic AP VS Percent: 44.52 %
Brady Statistic RA Percent Paced: 44.52 %
Brady Statistic RV Percent Paced: 0 %
Date Time Interrogation Session: 20150212141514
HIGH POWER IMPEDANCE MEASURED VALUE: 33 Ohm
HighPow Impedance: 41 Ohm
Lead Channel Impedance Value: 418 Ohm
Lead Channel Impedance Value: 437 Ohm
Lead Channel Pacing Threshold Amplitude: 0.625 V
Lead Channel Pacing Threshold Pulse Width: 0.4 ms
Lead Channel Sensing Intrinsic Amplitude: 2.125 mV
Lead Channel Sensing Intrinsic Amplitude: 25.75 mV
Lead Channel Setting Pacing Amplitude: 2 V
Lead Channel Setting Sensing Sensitivity: 0.3 mV
MDC IDC MSMT BATTERY VOLTAGE: 2.99 V
MDC IDC MSMT LEADCHNL RA SENSING INTR AMPL: 2.125 mV
MDC IDC MSMT LEADCHNL RV PACING THRESHOLD AMPLITUDE: 1.25 V
MDC IDC MSMT LEADCHNL RV PACING THRESHOLD PULSEWIDTH: 0.4 ms
MDC IDC MSMT LEADCHNL RV SENSING INTR AMPL: 25.75 mV
MDC IDC SET LEADCHNL RV PACING AMPLITUDE: 2.5 V
MDC IDC SET LEADCHNL RV PACING PULSEWIDTH: 0.4 ms
MDC IDC SET ZONE DETECTION INTERVAL: 300 ms
MDC IDC SET ZONE DETECTION INTERVAL: 350 ms
MDC IDC STAT BRADY AS VP PERCENT: 0 %
MDC IDC STAT BRADY AS VS PERCENT: 55.48 %
Zone Setting Detection Interval: 350 ms
Zone Setting Detection Interval: 400 ms

## 2013-07-08 NOTE — Telephone Encounter (Signed)
New message    Sent defibulator transmission remotley.  Did you get it?

## 2013-07-08 NOTE — Telephone Encounter (Signed)
Updated pt we did receive her transmission.

## 2013-07-14 ENCOUNTER — Encounter: Payer: Self-pay | Admitting: *Deleted

## 2013-07-15 ENCOUNTER — Telehealth: Payer: Self-pay | Admitting: *Deleted

## 2013-07-15 DIAGNOSIS — I509 Heart failure, unspecified: Secondary | ICD-10-CM

## 2013-07-15 NOTE — Telephone Encounter (Signed)
Will place a copy of this phone note with a paper copy of the patient's transmission from 07/08/13 in the red folder on Dr. Jenel Lucks cart.

## 2013-07-15 NOTE — Telephone Encounter (Signed)
I called and spoke with the patient regarding her optivol levels from last week. I had missed calling her since this was her regularly scheduled 3 month follow up. (Device clinic will charge for ICM since I was week out from calling her). The patient states that she feels ok overall. She does wake some mornings with congestion, but this clears for her. She does not report any edema. She recently saw Dr. Teena Dunk (GI), in [place] last week for unexplained weight loss that she has been experiencing over the last 2 years. She reported to me last month that she has had lost from 160 lbs to 126 lbs. She did have an endoscopy and colonoscopy last week. She will follow up with GI next week for the results. She states her weight has gone up 3 lbs over the last month. I explained to her that her optivol trends go up extensively and then return to baseline, and this is a frequent pattern for her. She has been contemplating upgrade of her device to a CRT. Today she tells me that she thinks she will proceed with this in April. I advised we would review trends with Dr. Johney Frame to see if he has any further recommendations at this time. She will need follow up scheduled with him prior to device upgrade. She is scheduled to follow up with Dr. Purvis Sheffield in March. She thought this was on the 4th, but I advised her this was on the 16th. The patient was not aware of the appointment change (MD will be out of the office on 3/4) and she already has another appointment on 3/16. I asked that she call the [facility] office tomorrow to reschedule her follow up with Dr. Purvis Sheffield. She is agreeable.

## 2013-07-18 NOTE — Telephone Encounter (Signed)
Her last echo was 2013.  Please order an echo. Once she has had her echo, please schedule an appointment to see me in the office to discuss possible upgrade. No changes for now.

## 2013-07-26 NOTE — Telephone Encounter (Signed)
Spoke with Chip Boer in the [place] office. Echo scheduled for 08/04/13 at 9:00 am and follow up with Dr. Johney Frame on 09/06/13 @ 10:45 am. The patient is aware of both appointments and is agreeable. I will follow up on her optivol level 08/09/13. Follow up with Dr. Purvis Sheffield is on 3/18.

## 2013-07-26 NOTE — Telephone Encounter (Signed)
I spoke with the patient, she is agreeable with having and echo done and seeing Dr. Johney Frame after that. We will schedule this to be done in the [place] office.

## 2013-07-28 ENCOUNTER — Ambulatory Visit: Payer: Medicare Other | Admitting: Cardiovascular Disease

## 2013-08-04 ENCOUNTER — Other Ambulatory Visit: Payer: Self-pay

## 2013-08-04 ENCOUNTER — Other Ambulatory Visit (INDEPENDENT_AMBULATORY_CARE_PROVIDER_SITE_OTHER): Payer: Medicare HMO

## 2013-08-04 DIAGNOSIS — I2589 Other forms of chronic ischemic heart disease: Secondary | ICD-10-CM

## 2013-08-04 DIAGNOSIS — I509 Heart failure, unspecified: Secondary | ICD-10-CM

## 2013-08-04 DIAGNOSIS — I5022 Chronic systolic (congestive) heart failure: Secondary | ICD-10-CM

## 2013-08-09 ENCOUNTER — Ambulatory Visit: Payer: Medicare Other | Admitting: Cardiovascular Disease

## 2013-08-09 ENCOUNTER — Ambulatory Visit (INDEPENDENT_AMBULATORY_CARE_PROVIDER_SITE_OTHER): Payer: Medicare HMO | Admitting: *Deleted

## 2013-08-09 DIAGNOSIS — I509 Heart failure, unspecified: Secondary | ICD-10-CM

## 2013-08-09 DIAGNOSIS — I5022 Chronic systolic (congestive) heart failure: Secondary | ICD-10-CM

## 2013-08-09 DIAGNOSIS — Z95810 Presence of automatic (implantable) cardiac defibrillator: Secondary | ICD-10-CM

## 2013-08-09 NOTE — Progress Notes (Signed)
EPIC Encounter for ICM Monitoring  Patient Name: Tracy Mills is a 68 y.o. female Date: 08/09/2013 Primary Care Physican: Ignatius Specking., MD Primary Cardiologist: Purvis Sheffield Electrophysiologist: Allred Dry Weight: 126 lbs       In the past month, have you:  1. Gained more than 2 pounds in a day or more than 5 pounds in a week? Yes. The patient weighs about 131 lbs now. She is unsure if her baseline weight has shifted.  2. Had changes in your medications (with verification of current medications)? no  3. Had more shortness of breath than is usual for you? no  4. Limited your activity because of shortness of breath? no  5. Not been able to sleep because of shortness of breath? no  6. Had increased swelling in your feet or ankles? no  7. Had symptoms of dehydration (dizziness, dry mouth, increased thirst, decreased urine output) no  8. Had changes in sodium restriction? no  9. Been compliant with medication? Yes   ICM trend:   Follow-up plan: ICM clinic phone appointment: Will not schedule at this time. The patient has follow up on 08/11/13 with Dr. Purvis Sheffield. She is already scheduled to see Dr. Johney Frame on 09/06/13 for discussion regarding an upgrade to a CRT device. I will follow back up with her in May if it is decided not to proceed with CRT upgrade. If the patient proceeds with CRT, I will find out when this is scheduled and follow back up with her about 90 days post procedure. I have explained this to the patient as well as notified her of her echo results. She voices understanding.  Copy of note sent to patient's primary care physician, primary cardiologist, and device following physician.  Sherri Rad, RN, BSN 08/09/2013 5:20 PM

## 2013-08-10 ENCOUNTER — Telehealth: Payer: Self-pay | Admitting: *Deleted

## 2013-08-10 NOTE — Telephone Encounter (Signed)
Message copied by Lesle Chris on Tue Aug 10, 2013  4:15 PM ------      Message from: Hillis Range      Created: Wed Aug 04, 2013  8:36 PM       Please inform patient of echo results.  She is scheduled to see Dr Purvis Sheffield later this month and me in APril to arrange CRT-D upgrade ------

## 2013-08-10 NOTE — Telephone Encounter (Signed)
Notes Recorded by Lesle Chris, LPN on 08/13/2332 at 4:15 PM Patient notified and verbalized understanding.

## 2013-08-11 ENCOUNTER — Ambulatory Visit (INDEPENDENT_AMBULATORY_CARE_PROVIDER_SITE_OTHER): Payer: Medicare HMO | Admitting: Cardiovascular Disease

## 2013-08-11 ENCOUNTER — Encounter: Payer: Self-pay | Admitting: Cardiovascular Disease

## 2013-08-11 VITALS — BP 101/70 | HR 77 | Ht 63.0 in | Wt 130.0 lb

## 2013-08-11 DIAGNOSIS — I2510 Atherosclerotic heart disease of native coronary artery without angina pectoris: Secondary | ICD-10-CM

## 2013-08-11 DIAGNOSIS — I509 Heart failure, unspecified: Secondary | ICD-10-CM

## 2013-08-11 DIAGNOSIS — I472 Ventricular tachycardia, unspecified: Secondary | ICD-10-CM

## 2013-08-11 DIAGNOSIS — I10 Essential (primary) hypertension: Secondary | ICD-10-CM

## 2013-08-11 DIAGNOSIS — Z95810 Presence of automatic (implantable) cardiac defibrillator: Secondary | ICD-10-CM

## 2013-08-11 DIAGNOSIS — I5022 Chronic systolic (congestive) heart failure: Secondary | ICD-10-CM

## 2013-08-11 DIAGNOSIS — I4729 Other ventricular tachycardia: Secondary | ICD-10-CM

## 2013-08-11 DIAGNOSIS — I255 Ischemic cardiomyopathy: Secondary | ICD-10-CM

## 2013-08-11 DIAGNOSIS — I059 Rheumatic mitral valve disease, unspecified: Secondary | ICD-10-CM

## 2013-08-11 DIAGNOSIS — I340 Nonrheumatic mitral (valve) insufficiency: Secondary | ICD-10-CM

## 2013-08-11 DIAGNOSIS — I2589 Other forms of chronic ischemic heart disease: Secondary | ICD-10-CM

## 2013-08-11 DIAGNOSIS — E785 Hyperlipidemia, unspecified: Secondary | ICD-10-CM

## 2013-08-11 NOTE — Progress Notes (Signed)
Patient ID: Tracy Mills, female   DOB: Feb 05, 1946, 68 y.o.   MRN: [id number]      SUBJECTIVE: The patient is here to followup for CAD and ischemic cardiomyopathy with chronic systolic heart failure. Weight is 130 pounds today. She said that her appetite has improved. She said that she underwent an upper endoscopy and colonoscopy and was told that she had had a gastrointestinal infection for which she is taking antibiotics. She is now eating more. She denies chest pain, shortness of breath, orthopnea, leg swelling, palpitations, and paroxysmal nocturnal dyspnea. Echo showed EF 20%, and she has been scheduled to f/u with Dr. Johney Frame on 4/13 to discuss CRT-D upgrade.  Allergies  Allergen Reactions  . Penicillins Hives  . Tetracyclines & Related     Current Outpatient Prescriptions  Medication Sig Dispense Refill  . aspirin 81 MG tablet Take 81 mg by mouth daily.        . carvedilol (COREG) 3.125 MG tablet Take 1 tablet (3.125 mg total) by mouth 2 (two) times daily.  180 tablet  3  . furosemide (LASIX) 40 MG tablet Take 1 tablet (40 mg total) by mouth daily.  30 tablet  6  . losartan (COZAAR) 50 MG tablet Take 1 tablet (50 mg total) by mouth daily.  30 tablet  3  . mirtazapine (REMERON) 15 MG tablet Take 15 mg by mouth as needed.      . nitroGLYCERIN (NITROSTAT) 0.4 MG SL tablet Place 1 tablet (0.4 mg total) under the tongue every 5 (five) minutes as needed.  25 tablet  3  . potassium chloride (K-DUR) 10 MEQ tablet Take 1 tablet (10 mEq total) by mouth as needed. (TAKE ONE TAB ON THE DAYS THAT YOU TAKE YOUR LASIX)      . simvastatin (ZOCOR) 40 MG tablet TAKE ONE TABLET BY MOUTH AT BEDTIME  30 tablet  1  . spironolactone (ALDACTONE) 25 MG tablet Take 1 tablet (25 mg total) by mouth daily.  30 tablet  6   No current facility-administered medications for this visit.    Past Medical History  Diagnosis Date  . Chronic systolic congestive heart failure   . Hypertension   . Tobacco abuse     . Coronary artery disease     anterior MI in 2010. LAD stent placement 2010. EF: 15%  . MR (mitral regurgitation)     moderate to severe  . LBBB (left bundle branch block)   . Ischemic cardiomyopathy     Past Surgical History  Procedure Laterality Date  . Cardiac defibrillator placement  06/14/09    MDT [address]  . Tonsillectomy and adenoidectomy    . Tubal ligation    . Cardiac catheterization  2010    LAD PCI and stent placement    History   Social History  . Marital Status: Single    Spouse Name: N/A    Number of Children: N/A  . Years of Education: N/A   Occupational History  . RETIRED     retired bus Office manager office   Social History Main Topics  . Smoking status: Current Some Day Smoker -- 0.20 packs/day for 40 years    Types: Cigarettes  . Smokeless tobacco: Never Used     Comment: smokes socially/ smokes 2 cigarettes per month, trying to quit  . Alcohol Use: Yes     Comment: occasional alcohol use  . Drug Use: No  . Sexual Activity: Not on file   Other Topics  Concern  . Not on file   Social History Narrative   [place]  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 Lungs: Clear to auscultation bilaterally with normal respiratory effort. CV: Nondisplaced PMI.  Regular rate and rhythm, normal S1/S2, +S3, no S4, no murmur. No pretibial or periankle edema.  No carotid bruit.  Normal pedal pulses.  Abdomen: Soft, nontender, no hepatosplenomegaly, no distention.  Neurologic: Alert and oriented x 3.  Psych: Normal affect. Extremities: No clubbing or cyanosis.   ECG: reviewed and available in electronic records.  ECHO (07/2013): - Left ventricle: The cavity size was moderately to severely dilated. Wall thickness was increased in a pattern of mild LVH. The estimated ejection fraction was 20%.  Diffuse hypokinesis. Findings consistent with left ventricular diastolic dysfunction. There is smoke in the LV. - Mitral valve: Moderate regurgitation. - Left atrium: The atrium was moderately to severely dilated. - Right ventricle: The cavity size was moderately dilated. Pacer wire or catheter noted in right ventricle. Systolic function was moderately reduced. - Right atrium: The atrium was mildly dilated. - Pulmonary arteries: PA peak pressure: 36mm Hg (S).     ASSESSMENT AND PLAN:  Chronic systolic congestive heart failure  Continue Lasix 40 mg daily for maintenance. She is compensated today. Will need to continue to follow renal function and potassium closely. [facility]  Ischemic cardiomyopathy  Quiescent on current medication regimen. Echo showed EF 20%, and has been scheduled to f/u with Dr. Johney Mills on 4/13 to discuss CRT-D upgrade. Stress test in 2012 showed only myocardial scar, without ischemia. She is taking ASA 81 mg daily.  Ventricular tachycardia  Status post ICD, followed by Dr. Johney Mills.   Hypertension  Normal.  Hyperlipidemia  Followed by primary M.D. We'll request most recent lab results.   Dispo: f/u 6 months.  Prentice DockerSuresh Rondall Radigan, M.D., F.A.C.C.

## 2013-08-11 NOTE — Patient Instructions (Signed)
Continue all current medications. Your physician wants you to follow up in: 6 months.  You will receive a reminder letter in the mail one-two months in advance.  If you don't receive a letter, please call our office to schedule the follow up appointment   

## 2013-08-16 ENCOUNTER — Other Ambulatory Visit: Payer: Self-pay | Admitting: Cardiology

## 2013-08-16 MED ORDER — SIMVASTATIN 40 MG PO TABS: ORAL_TABLET | ORAL | Status: DC

## 2013-09-06 ENCOUNTER — Encounter: Payer: Medicare HMO | Admitting: Internal Medicine

## 2013-09-16 ENCOUNTER — Encounter: Payer: Self-pay | Admitting: *Deleted

## 2013-09-16 ENCOUNTER — Ambulatory Visit (INDEPENDENT_AMBULATORY_CARE_PROVIDER_SITE_OTHER): Payer: Medicare HMO | Admitting: *Deleted

## 2013-09-16 ENCOUNTER — Telehealth: Payer: Self-pay | Admitting: Internal Medicine

## 2013-09-16 DIAGNOSIS — Z95810 Presence of automatic (implantable) cardiac defibrillator: Secondary | ICD-10-CM

## 2013-09-16 DIAGNOSIS — I509 Heart failure, unspecified: Secondary | ICD-10-CM

## 2013-09-16 DIAGNOSIS — I5022 Chronic systolic (congestive) heart failure: Secondary | ICD-10-CM

## 2013-09-16 NOTE — Telephone Encounter (Signed)
I spoke with Amber to see if she thought it would be ok to overbook Dr. Johney Frame in May prior to seeing Dr. Jenel Lucks message. She thought it would be ok on 5/4. Patient scheduled at 9:45 am in the [place] office.

## 2013-09-16 NOTE — Progress Notes (Signed)
EPIC Encounter for ICM Monitoring  Patient Name: Tracy Mills is a 68 y.o. female Date: 09/16/2013 Primary Care Physican: Ignatius Specking., MD Primary Cardiologist: Darl Householder Electrophysiologist: Allred Dry Weight: 126 lbs       In the past month, have you:  1. Gained more than 2 pounds in a day or more than 5 pounds in a week? No. The patient reports that she saw her PCP last week and her weight is up 6 lbs. She was 132 lbs in Dr. Sherril Croon' office. Her Optivol looks good today. She was having some GI problems which she has been treated for. Her appetite is picking back up. I advised her baseline weight may be shifting again due to her increased appetite.  2. Had changes in your medications (with verification of current medications)? no  3. Had more shortness of breath than is usual for you? no  4. Limited your activity because of shortness of breath? no  5. Not been able to sleep because of shortness of breath? no  6. Had increased swelling in your feet or ankles? no  7. Had symptoms of dehydration (dizziness, dry mouth, increased thirst, decreased urine output) no  8. Had changes in sodium restriction? no  9. Been compliant with medication? Yes   ICM trend:   Follow-up plan: ICM clinic phone appointment: 10/18/13. I have forwarded a message to Dr. Johney Frame to see if he can work her in in the [place] office next month (first opening in [place] is not until the middle of July). The patient forgot about her follow up last week with Dr. Johney Frame to discuss upgrade to a CRT device. In speaking with her, she does sound ready to pursue this. I advised the patient I would back in touch with her as to when Dr. Johney Frame can see her.   Copy of note sent to patient's primary care physician, primary cardiologist, and device following physician.  Jefferey Pica, RN, BSN 09/16/2013 11:16 AM

## 2013-09-16 NOTE — Telephone Encounter (Signed)
Is there a chance that she could come to [facility]?  I could see her sooner here.

## 2013-09-16 NOTE — Telephone Encounter (Signed)
I called and spoke with the patient. She missed her appointment last week with Dr. Johney Frame in the [place] office. (She forgot about the appointment). She called in today wanting to know when Dr. Johney Frame would be in [place] again. I advised her he is only there on 5/4 and 5/29 and his schedules are booked. I advised I would check with him to see if he can work her in in May to discuss upgrading her device to a CRT. Her Optivol levels have been up and down quite a bit. I did not check her ICM this month in anticipation that Dr. Johney Frame would see her in the clinic on 4/13. She will send a manuel transmission today so I can assess where she is at. She is agreeable with this. I will report findings through an ICM note. I will forward this message to Dr. Johney Frame about possibly working her in in [place] next month.

## 2013-09-16 NOTE — Telephone Encounter (Signed)
New message     Pt states she want to talk to [facility] regarding her missed appt from last week

## 2013-09-27 ENCOUNTER — Encounter: Payer: Self-pay | Admitting: *Deleted

## 2013-09-27 ENCOUNTER — Ambulatory Visit (INDEPENDENT_AMBULATORY_CARE_PROVIDER_SITE_OTHER): Payer: Medicare HMO | Admitting: Internal Medicine

## 2013-09-27 ENCOUNTER — Encounter: Payer: Self-pay | Admitting: Internal Medicine

## 2013-09-27 ENCOUNTER — Other Ambulatory Visit: Payer: Self-pay | Admitting: *Deleted

## 2013-09-27 ENCOUNTER — Telehealth: Payer: Self-pay | Admitting: Internal Medicine

## 2013-09-27 VITALS — BP 95/67 | HR 87 | Ht 63.0 in | Wt 133.8 lb

## 2013-09-27 DIAGNOSIS — I255 Ischemic cardiomyopathy: Secondary | ICD-10-CM

## 2013-09-27 DIAGNOSIS — I472 Ventricular tachycardia, unspecified: Secondary | ICD-10-CM

## 2013-09-27 DIAGNOSIS — I5022 Chronic systolic (congestive) heart failure: Secondary | ICD-10-CM

## 2013-09-27 DIAGNOSIS — I2510 Atherosclerotic heart disease of native coronary artery without angina pectoris: Secondary | ICD-10-CM

## 2013-09-27 DIAGNOSIS — I4729 Other ventricular tachycardia: Secondary | ICD-10-CM

## 2013-09-27 DIAGNOSIS — I509 Heart failure, unspecified: Secondary | ICD-10-CM

## 2013-09-27 DIAGNOSIS — I2589 Other forms of chronic ischemic heart disease: Secondary | ICD-10-CM

## 2013-09-27 DIAGNOSIS — I340 Nonrheumatic mitral (valve) insufficiency: Secondary | ICD-10-CM

## 2013-09-27 DIAGNOSIS — I10 Essential (primary) hypertension: Secondary | ICD-10-CM

## 2013-09-27 DIAGNOSIS — I059 Rheumatic mitral valve disease, unspecified: Secondary | ICD-10-CM

## 2013-09-27 DIAGNOSIS — Z01810 Encounter for preprocedural cardiovascular examination: Secondary | ICD-10-CM

## 2013-09-27 LAB — MDC_IDC_ENUM_SESS_TYPE_INCLINIC
Brady Statistic AP VP Percent: 0.02 %
Brady Statistic AP VS Percent: 27.97 %
Brady Statistic AS VS Percent: 71.99 %
Brady Statistic RA Percent Paced: 27.99 %
Brady Statistic RV Percent Paced: 0.04 %
HIGH POWER IMPEDANCE MEASURED VALUE: 38 Ohm
HighPow Impedance: 46 Ohm
Lead Channel Impedance Value: 475 Ohm
Lead Channel Pacing Threshold Amplitude: 0.625 V
Lead Channel Pacing Threshold Amplitude: 1 V
Lead Channel Pacing Threshold Pulse Width: 0.4 ms
Lead Channel Sensing Intrinsic Amplitude: 1.625 mV
Lead Channel Sensing Intrinsic Amplitude: 29.25 mV
Lead Channel Setting Pacing Amplitude: 2 V
Lead Channel Setting Pacing Pulse Width: 0.4 ms
Lead Channel Setting Sensing Sensitivity: 0.3 mV
MDC IDC MSMT BATTERY VOLTAGE: 2.99 V
MDC IDC MSMT LEADCHNL RA IMPEDANCE VALUE: 437 Ohm
MDC IDC MSMT LEADCHNL RV PACING THRESHOLD PULSEWIDTH: 0.4 ms
MDC IDC SESS DTM: 20150504130704
MDC IDC SET LEADCHNL RV PACING AMPLITUDE: 2.5 V
MDC IDC SET ZONE DETECTION INTERVAL: 350 ms
MDC IDC STAT BRADY AS VP PERCENT: 0.03 %
Zone Setting Detection Interval: 300 ms
Zone Setting Detection Interval: 350 ms
Zone Setting Detection Interval: 400 ms

## 2013-09-27 NOTE — Telephone Encounter (Signed)
ICD upgrade to BiV 11/16/13 @8 :00 am with Dr. Johney Frame   Checking percert

## 2013-09-27 NOTE — Progress Notes (Signed)
PCP: Ignatius Specking., MD Primary Cardiologist:  [name] Tracy Mills is a 68 y.o. female who presents today for routine electrophysiology followup.  Since last being seen in our clinic, the patient reports doing very well.  She does have fatigue and decreased exercise tolerance. She is SOB with moderate activity.  Today, she denies symptoms of palpitations, chest pain, shortness of breath at rest, lower extremity edema, dizziness, presyncope, syncope, or ICD shocks.  The patient is otherwise without complaint today.   Past Medical History  Diagnosis Date  . Chronic systolic congestive heart failure   . Hypertension   . Tobacco abuse   . Coronary artery disease     anterior MI in 2010. LAD stent placement 2010. EF: 15%  . MR (mitral regurgitation)     moderate to severe  . LBBB (left bundle branch block)   . Ischemic cardiomyopathy    Past Surgical History  Procedure Laterality Date  . Cardiac defibrillator placement  06/14/09    MDT [address]  . Tonsillectomy and adenoidectomy    . Tubal ligation    . Cardiac catheterization  2010    LAD PCI and stent placement    Current Outpatient Prescriptions  Medication Sig Dispense Refill  . aspirin 81 MG tablet Take 81 mg by mouth daily.        . carvedilol (COREG) 3.125 MG tablet Take 1 tablet (3.125 mg total) by mouth 2 (two) times daily.  180 tablet  3  . furosemide (LASIX) 40 MG tablet Take 1 tablet (40 mg total) by mouth daily.  30 tablet  6  . losartan (COZAAR) 50 MG tablet Take 1 tablet (50 mg total) by mouth daily.  30 tablet  3  . mirtazapine (REMERON) 15 MG tablet Take 15 mg by mouth as needed.      . nitroGLYCERIN (NITROSTAT) 0.4 MG SL tablet Place 1 tablet (0.4 mg total) under the tongue every 5 (five) minutes as needed.  25 tablet  3  . omeprazole (PRILOSEC) 20 MG capsule Take 20 mg by mouth daily.      . potassium chloride (K-DUR) 10 MEQ tablet Take 1 tablet (10 mEq total) by mouth as needed. (TAKE ONE TAB ON THE DAYS  THAT YOU TAKE YOUR LASIX)      . simvastatin (ZOCOR) 40 MG tablet TAKE ONE TABLET BY MOUTH AT BEDTIME  30 tablet  6  . spironolactone (ALDACTONE) 25 MG tablet Take 1 tablet (25 mg total) by mouth daily.  30 tablet  6   No current facility-administered medications for this visit.    Physical Exam: Filed Vitals:   09/27/13 0940  BP: 95/67  Pulse: 87  Height: 5\' 3"  (1.6 m)  Weight: 133 lb 12.8 oz (60.691 kg)    GEN- The patient is well appearing, alert and oriented x 3 today.   Head- normocephalic, atraumatic Eyes-  Sclera clear, conjunctiva pink Ears- hearing intact Oropharynx- clear Lungs- Clear to ausculation bilaterally, normal work of breathing Chest- ICD pocket is well healed Heart- Regular rate and rhythm, no murmurs, wide S2 split GI- soft, NT, ND, + BS Extremities- no clubbing, cyanosis, or edema  ICD interrogation- reviewed in detail today,  See PACEART report ekgs are reviewed and reveal LBBB Echo 3/15 is reviewed  Assessment and Plan:  1.  Chronic systolic dysfunction euvolemic today She has NYHA Class III CHF and a LBBB.  EF 20% 2/13 by echo (reviewed today) Stable on an appropriate medical regimen Normal  ICD function See Arita Missace Art report No changes today  I had a long discussion with her regarding device upgrade to CRT-D device.  I do think that she would receive clincal benefit from CRT.  At this time, she meets criteria for ICD upgrade to CRT-D.  Risks, benefits, alternatives to CT upgrade were discussed in detail with the patient today. The patient  understands that the risks include but are not limited to bleeding, infection, pneumothorax, perforation, tamponade, vascular damage, renal failure, MI, stroke, death,and lead dislodgement and wishes to proceed.  I have scheduled her procedure for 11/16/13.  The importance of compliance with showing up for the procedure was stressed today  2. VT- previous NSVT Normal ICD function.  3. Tobacco Cessation is  advised She is trying to quit  [facility], Sherri RadHeather McGhee is also following

## 2013-09-27 NOTE — Patient Instructions (Signed)
   [facility]link check December 29, 2013. Your physician recommends that you schedule a follow-up appointment in: 1 year with Dr. Johney Frame. You will receive a reminder letter in the mail in about 10 months reminding you to call and schedule your appointment. If you don't receive this letter, please contact our office. Your physician recommends that you continue on your current medications as directed. Please refer to the Current Medication list given to you today. You will have a device change on November 16, 2013 @8 :00 am at [facility] with Dr. Johney Frame.

## 2013-10-21 ENCOUNTER — Telehealth: Payer: Self-pay | Admitting: Internal Medicine

## 2013-10-21 NOTE — Telephone Encounter (Signed)
Transmission received today on the patient for ICM clinic. Will not charge today since the patient just saw Dr. Johney Frame on 09/27/13. Optivol levels are normal. The patient is asymptomatic today. She is scheduled for an Bi-V upgrade on 11/16/13. I advised I will folllow up with her by phone about 30 days post procedure and resume her ICM transmissions in August. She voices understanding.

## 2013-10-28 NOTE — Telephone Encounter (Signed)
Medsolns [id number] exp 01-25-14

## 2013-11-03 ENCOUNTER — Other Ambulatory Visit: Payer: Self-pay | Admitting: *Deleted

## 2013-11-03 MED ORDER — SPIRONOLACTONE 25 MG PO TABS: 25.0000 mg | ORAL_TABLET | Freq: Every day | ORAL | Status: DC

## 2013-11-03 MED ORDER — CARVEDILOL 3.125 MG PO TABS: 3.1250 mg | ORAL_TABLET | Freq: Two times a day (BID) | ORAL | Status: DC

## 2013-11-03 MED ORDER — FUROSEMIDE 40 MG PO TABS: 40.0000 mg | ORAL_TABLET | Freq: Every day | ORAL | Status: DC

## 2013-11-08 ENCOUNTER — Telehealth: Payer: Self-pay | Admitting: *Deleted

## 2013-11-08 NOTE — Telephone Encounter (Signed)
Patient called to inform office that she was called by [facility] today about her up coming procedure for device change out and was informed that her procedure was set up for an outpatient procedure. Patient said that at her last office visit, she and the doctor had discussed that her procedure would be set up as an inpatient procedure. Please advise.

## 2013-11-09 ENCOUNTER — Encounter: Payer: Self-pay | Admitting: Internal Medicine

## 2013-11-09 LAB — PROTIME-INR

## 2013-11-09 NOTE — Telephone Encounter (Signed)
Patient informed and verbalized understanding of plan. 

## 2013-11-09 NOTE — Telephone Encounter (Signed)
All procedures are outpatient.  There is nothing anyone can do to change that unless they stay more than one night.  It has to do with time.  They are considered observation

## 2013-11-10 ENCOUNTER — Encounter (HOSPITAL_COMMUNITY): Payer: Self-pay | Admitting: Pharmacy Technician

## 2013-11-11 ENCOUNTER — Encounter: Payer: Self-pay | Admitting: Internal Medicine

## 2013-11-15 MED ORDER — SODIUM CHLORIDE 0.9 % IR SOLN
80.0000 mg | Status: DC
  Filled 2013-11-15: qty 2

## 2013-11-15 MED ORDER — VANCOMYCIN HCL IN DEXTROSE 1-5 GM/200ML-% IV SOLN
1000.0000 mg | INTRAVENOUS | Status: DC
  Filled 2013-11-15: qty 200

## 2013-11-15 MED ORDER — SODIUM CHLORIDE 0.9 % IV SOLN
INTRAVENOUS | Status: DC
  Administered 2013-11-16: 09:00:00 via INTRAVENOUS

## 2013-11-15 MED ORDER — CHLORHEXIDINE GLUCONATE 4 % EX LIQD
60.0000 mL | Freq: Once | CUTANEOUS | Status: DC
  Filled 2013-11-15: qty 60

## 2013-11-16 ENCOUNTER — Ambulatory Visit (HOSPITAL_COMMUNITY)
Admission: RE | Admit: 2013-11-16 | Discharge: 2013-11-17 | Disposition: A | Discharge status: Home / Self Care | Payer: Medicare HMO | Source: Ambulatory Visit | Attending: Internal Medicine | Admitting: Internal Medicine

## 2013-11-16 ENCOUNTER — Encounter (HOSPITAL_COMMUNITY)
Admission: RE | Disposition: A | Discharge status: Home / Self Care | Payer: Self-pay | Source: Ambulatory Visit | Attending: Internal Medicine

## 2013-11-16 ENCOUNTER — Encounter (HOSPITAL_COMMUNITY): Payer: Self-pay | Admitting: Internal Medicine

## 2013-11-16 DIAGNOSIS — I447 Left bundle-branch block, unspecified: Secondary | ICD-10-CM | POA: Insufficient documentation

## 2013-11-16 DIAGNOSIS — F17200 Nicotine dependence, unspecified, uncomplicated: Secondary | ICD-10-CM | POA: Insufficient documentation

## 2013-11-16 DIAGNOSIS — Z7982 Long term (current) use of aspirin: Secondary | ICD-10-CM | POA: Insufficient documentation

## 2013-11-16 DIAGNOSIS — I472 Ventricular tachycardia, unspecified: Secondary | ICD-10-CM

## 2013-11-16 DIAGNOSIS — I340 Nonrheumatic mitral (valve) insufficiency: Secondary | ICD-10-CM

## 2013-11-16 DIAGNOSIS — Z4502 Encounter for adjustment and management of automatic implantable cardiac defibrillator: Secondary | ICD-10-CM | POA: Insufficient documentation

## 2013-11-16 DIAGNOSIS — Z720 Tobacco use: Secondary | ICD-10-CM

## 2013-11-16 DIAGNOSIS — I10 Essential (primary) hypertension: Secondary | ICD-10-CM | POA: Insufficient documentation

## 2013-11-16 DIAGNOSIS — I5022 Chronic systolic (congestive) heart failure: Secondary | ICD-10-CM | POA: Insufficient documentation

## 2013-11-16 DIAGNOSIS — I2510 Atherosclerotic heart disease of native coronary artery without angina pectoris: Secondary | ICD-10-CM | POA: Insufficient documentation

## 2013-11-16 DIAGNOSIS — I252 Old myocardial infarction: Secondary | ICD-10-CM | POA: Insufficient documentation

## 2013-11-16 DIAGNOSIS — I255 Ischemic cardiomyopathy: Secondary | ICD-10-CM

## 2013-11-16 DIAGNOSIS — I2589 Other forms of chronic ischemic heart disease: Secondary | ICD-10-CM

## 2013-11-16 DIAGNOSIS — E785 Hyperlipidemia, unspecified: Secondary | ICD-10-CM

## 2013-11-16 DIAGNOSIS — Z9861 Coronary angioplasty status: Secondary | ICD-10-CM | POA: Insufficient documentation

## 2013-11-16 DIAGNOSIS — I509 Heart failure, unspecified: Secondary | ICD-10-CM

## 2013-11-16 HISTORY — PX: BI-VENTRICULAR IMPLANTABLE CARDIOVERTER DEFIBRILLATOR UPGRADE: SHX5749

## 2013-11-16 HISTORY — PX: BI-VENTRICULAR IMPLANTABLE CARDIOVERTER DEFIBRILLATOR UPGRADE: SHX5461

## 2013-11-16 HISTORY — DX: Ventricular fibrillation: I49.01

## 2013-11-16 LAB — BASIC METABOLIC PANEL
BUN: 31 mg/dL — ABNORMAL HIGH (ref 6–23)
CALCIUM: 9.6 mg/dL (ref 8.4–10.5)
CO2: 24 mEq/L (ref 19–32)
Chloride: 103 mEq/L (ref 96–112)
Creatinine, Ser: 1.49 mg/dL — ABNORMAL HIGH (ref 0.50–1.10)
GFR calc Af Amer: 41 mL/min — ABNORMAL LOW (ref >90)
GFR calc non Af Amer: 35 mL/min — ABNORMAL LOW (ref >90)
GLUCOSE: 92 mg/dL (ref 70–99)
POTASSIUM: 3.8 meq/L (ref 3.7–5.3)
Sodium: 141 mEq/L (ref 137–147)

## 2013-11-16 LAB — SURGICAL PCR SCREEN
MRSA, PCR: NEGATIVE
Staphylococcus aureus: NEGATIVE

## 2013-11-16 LAB — CBC
HCT: 41.1 % (ref 36.0–46.0)
Hemoglobin: 14.1 g/dL (ref 12.0–15.0)
MCH: 31.5 pg (ref 26.0–34.0)
MCHC: 34.3 g/dL (ref 30.0–36.0)
MCV: 91.7 fL (ref 78.0–100.0)
Platelets: 271 10*3/uL (ref 150–400)
RBC: 4.48 MIL/uL (ref 3.87–5.11)
RDW: 15.4 % (ref 11.5–15.5)
WBC: 4.9 10*3/uL (ref 4.0–10.5)

## 2013-11-16 SURGERY — BI-VENTRICULAR IMPLANTABLE CARDIOVERTER DEFIBRILLATOR UPGRADE
Anesthesia: LOCAL

## 2013-11-16 MED ORDER — ACETAMINOPHEN 325 MG PO TABS: 325.0000 mg | ORAL_TABLET | ORAL | Status: DC | PRN

## 2013-11-16 MED ORDER — MIDAZOLAM HCL 5 MG/5ML IJ SOLN
INTRAMUSCULAR | Status: AC
  Filled 2013-11-16: qty 5

## 2013-11-16 MED ORDER — MUPIROCIN 2 % EX OINT
TOPICAL_OINTMENT | CUTANEOUS | Status: AC
  Administered 2013-11-16: 1
  Filled 2013-11-16: qty 22

## 2013-11-16 MED ORDER — VANCOMYCIN HCL IN DEXTROSE 1-5 GM/200ML-% IV SOLN
1000.0000 mg | Freq: Two times a day (BID) | INTRAVENOUS | Status: AC
  Administered 2013-11-16: 1000 mg via INTRAVENOUS
  Filled 2013-11-16: qty 200

## 2013-11-16 MED ORDER — LIDOCAINE HCL (PF) 1 % IJ SOLN
INTRAMUSCULAR | Status: AC
  Filled 2013-11-16: qty 60

## 2013-11-16 MED ORDER — SODIUM CHLORIDE 0.9 % IJ SOLN: 3.0000 mL | INTRAMUSCULAR | Status: DC | PRN

## 2013-11-16 MED ORDER — CARVEDILOL 3.125 MG PO TABS
3.1250 mg | ORAL_TABLET | Freq: Two times a day (BID) | ORAL | Status: DC
  Administered 2013-11-16 – 2013-11-17 (×2): 3.125 mg via ORAL
  Filled 2013-11-16 (×4): qty 1

## 2013-11-16 MED ORDER — MIRTAZAPINE 15 MG PO TABS
15.0000 mg | ORAL_TABLET | Freq: Every evening | ORAL | Status: DC | PRN
  Filled 2013-11-16: qty 1

## 2013-11-16 MED ORDER — PANTOPRAZOLE SODIUM 40 MG PO TBEC
40.0000 mg | DELAYED_RELEASE_TABLET | Freq: Every day | ORAL | Status: DC
  Administered 2013-11-16 – 2013-11-17 (×2): 40 mg via ORAL
  Filled 2013-11-16 (×2): qty 1

## 2013-11-16 MED ORDER — ONDANSETRON HCL 4 MG/2ML IJ SOLN
4.0000 mg | Freq: Four times a day (QID) | INTRAMUSCULAR | Status: DC | PRN
Start: 2013-11-16 — End: 2013-11-17
  Administered 2013-11-17: 4 mg via INTRAVENOUS
  Filled 2013-11-16: qty 2

## 2013-11-16 MED ORDER — ASPIRIN 81 MG PO TABS: 81.0000 mg | ORAL_TABLET | Freq: Every day | ORAL | Status: DC

## 2013-11-16 MED ORDER — FENTANYL CITRATE 0.05 MG/ML IJ SOLN
INTRAMUSCULAR | Status: AC
  Filled 2013-11-16: qty 2

## 2013-11-16 MED ORDER — ASPIRIN 81 MG PO CHEW
81.0000 mg | CHEWABLE_TABLET | Freq: Every day | ORAL | Status: DC
  Administered 2013-11-16 – 2013-11-17 (×2): 81 mg via ORAL
  Filled 2013-11-16 (×2): qty 1

## 2013-11-16 MED ORDER — SODIUM CHLORIDE 0.9 % IV SOLN: 250.0000 mL | INTRAVENOUS | Status: DC | PRN

## 2013-11-16 MED ORDER — SODIUM CHLORIDE 0.9 % IJ SOLN
3.0000 mL | Freq: Two times a day (BID) | INTRAMUSCULAR | Status: DC
  Administered 2013-11-17: 3 mL via INTRAVENOUS

## 2013-11-16 MED ORDER — HYDROCODONE-ACETAMINOPHEN 5-325 MG PO TABS
1.0000 | ORAL_TABLET | ORAL | Status: DC | PRN
  Administered 2013-11-16: 1 via ORAL
  Filled 2013-11-16: qty 1

## 2013-11-16 MED ORDER — HEPARIN (PORCINE) IN NACL 2-0.9 UNIT/ML-% IJ SOLN
INTRAMUSCULAR | Status: AC
  Filled 2013-11-16: qty 500

## 2013-11-16 NOTE — Op Note (Signed)
SURGEON: Hillis RangeJames Allred, MD   PREPROCEDURE DIAGNOSES:  1. Ischemic cardiomyopathy.  2. New York Heart Association class III, heart failure chronically.  3. Left bundle-branch block.   POSTPROCEDURE DIAGNOSES:  1. Ischemic cardiomyopathy.  2. New York Heart Association class III heart failure chronically.  3. Left bundle-branch block.   PROCEDURES:  1. Left upper extremity venography  2. Biventricular ICD upgrade.   INTRODUCTION:  Tracy PontesShirley P Bolin is a 68 y.o. female with an ischemic CM (EF 20%), NYHA Class III CHF, and LBBB QRS morophology.  She is s/p MDT ICD previously for primary prevention of sudden death.  She has a LBBB with QRS > 150 msec and is expected to benefit from resynchronization therapy.  The patient has been treated with an optimal medical regimen but continues to have a depressed ejection fraction and NYHA Class III CHF symptoms. She therefore presents today for a biventricular ICD upgrade.   DESCRIPTION OF PROCEDURE: Informed written consent was obtained and the patient was brought to the electrophysiology lab in the fasting state. The patient was adequately sedated with intravenous Versed, and fentanyl as outlined in the nursing report. The patient's left chest was prepped and draped in the usual sterile fashion by the EP lab staff. The skin overlying her existing ICD pocket was infiltrated with lidocaine for local analgesia. A 5-cm incision was made over the left deltopectoral region. The device was removed from the pocket and disconnected from the leads.  The leads were examined and their integrity was confirmed to be intact.  The pocket was revised to accomodate a new device.  Electrocautery was used to assure hemostasis.   A venogram of the left upper extremity was performed which revealed a patent left axillary vein which emptied into a moderate sized left subclavian vein.  RA/RV Lead Evaluation:  The right atrial lead was confirmed to be a Medtronic model [id number] (serial #  [id number]) right atrial lead and the right ventricular lead was confirmed to be a  Medtronic model [id number] (serial number [id number]) right ventricular defibrillator lead.  Both leads were implanted on 06/14/2009.  Atrial lead P-waves measured 2.8  mV with an impedance of 492 ohms and a threshold of 0.4 volts at 0.5 milliseconds. The right ventricular lead R-wave measured 25 mV with impedance of 508 ohms and a threshold of 1 volts at 0.5 milliseconds.   LV Lead Placement:  A Medtronic MB-2 guide was advanced through the left axillary vein into the low lateral right atrium. A Bard curved Damato catheter was introduced through the MB-2 guide and used to cannulate the coronary sinus.   A selective coronary sinus venogram was performed by hand injection of nonionic contrast. This demonstrated a large CS body with 2 small  distal branches. There was a moderate sized lateral coronary sinus branch was noted along the mid portion of the CS body. No other posterior branches were identified.  A Whisper CSJ wire was introduced through the guide and advanced into the distal lateral branch. A Medtronic model 4598 - 88 (serial number [id number]) lead was advanced through the MB-2 into the lateral branch. This was approximately one-thirds from the base to the apex in a very lateral position. In this location, the left ventricular lead R-waves measured 16 mV with impedance of 680 ohms and a threshold of 1.3 volt at 0.5 milliseconds in the bipolar LV3-LV4 configuration with no diaphragmatic stimulation observed when pacing at 10 volts output. The MB-2 guide was therefore removed.  The lead was then  secured to the pectoralis fascia using #2 silk suture over the suture sleeve. The pocket then irrigated with copious gentamicin solution.   The leads were then connected to a Medtronic model DTBA1Q1 Ovidio Kin XT CRT-D (serial Number [id number]) device. The defibrillator was placed into the pocket. The pocket was then closed in 2  layers with 2.0 Vicryl suture for the subcutaneous and subcuticular layers. Steri-Strips and a sterile dressing were then applied.  DFT testing was not performed today. The procedure was therefore considered completed. There were no early apparhent complications.   CONCLUSIONS:  1. NIschemic cardiomyopathy with Left bundle-branch block and chronic New York Heart Association class III heart failure.  2. Successful biventricular ICD upgrade.  3. No early apparent complications.

## 2013-11-16 NOTE — H&P (Addendum)
PCP: Ignatius SpeckingVYAS,DHRUV B., MD  Primary Cardiologist: Dr Gwenlyn FoundHochrein   Tracy Mills is a 68 y.o. female who presents for upgrade of her ICD to a CRT-D device. Since last being seen in our clinic, the patient reports doing very well. She does have fatigue and decreased exercise tolerance. She is SOB with moderate activity. Today, she denies symptoms of palpitations, chest pain, shortness of breath at rest, lower extremity edema, dizziness, presyncope, syncope, or ICD shocks. The patient is otherwise without complaint today.    Past Medical History   Diagnosis  Date   .  Chronic systolic congestive heart failure    .  Hypertension    .  Tobacco abuse    .  Coronary artery disease      anterior MI in 2010. LAD stent placement 2010. EF: 15%   .  MR (mitral regurgitation)      moderate to severe   .  LBBB (left bundle branch block)    .  Ischemic cardiomyopathy     Past Surgical History   Procedure  Laterality  Date   .  Cardiac defibrillator placement   06/14/09     MDT [address]   .  Tonsillectomy and adenoidectomy     .  Tubal ligation     .  Cardiac catheterization   2010     LAD PCI and stent placement    Current Outpatient Prescriptions   Medication  Sig  Dispense  Refill   .  aspirin 81 MG tablet  Take 81 mg by mouth daily.     .  carvedilol (COREG) 3.125 MG tablet  Take 1 tablet (3.125 mg total) by mouth 2 (two) times daily.  180 tablet  3   .  furosemide (LASIX) 40 MG tablet  Take 1 tablet (40 mg total) by mouth daily.  30 tablet  6   .  losartan (COZAAR) 50 MG tablet  Take 1 tablet (50 mg total) by mouth daily.  30 tablet  3   .  mirtazapine (REMERON) 15 MG tablet  Take 15 mg by mouth as needed.     .  nitroGLYCERIN (NITROSTAT) 0.4 MG SL tablet  Place 1 tablet (0.4 mg total) under the tongue every 5 (five) minutes as needed.  25 tablet  3   .  omeprazole (PRILOSEC) 20 MG capsule  Take 20 mg by mouth daily.     .  potassium chloride (K-DUR) 10 MEQ tablet  Take 1 tablet (10 mEq total)  by mouth as needed. (TAKE ONE TAB ON THE DAYS THAT YOU TAKE YOUR LASIX)     .  simvastatin (ZOCOR) 40 MG tablet  TAKE ONE TABLET BY MOUTH AT BEDTIME  30 tablet  6   .  spironolactone (ALDACTONE) 25 MG tablet  Take 1 tablet (25 mg total) by mouth daily.  30 tablet  6    No current facility-administered medications for this visit.    Physical Exam:  Filed Vitals:   11/16/13 0815  BP: 115/77  Pulse: 98  Temp: 97.8 F (36.6 C)  Resp: 18   GEN- The patient is well appearing, alert and oriented x 3 today.  Head- normocephalic, atraumatic  Eyes- Sclera clear, conjunctiva pink  Ears- hearing intact  Oropharynx- clear  Lungs- Clear to ausculation bilaterally, normal work of breathing  Chest- ICD pocket is well healed  Heart- Regular rate and rhythm, no murmurs, wide S2 split  GI- soft, NT, ND, + BS  Extremities-  no clubbing, cyanosis, or edema  ICD interrogation- reviewed in detail today, See PACEART report  ekgs are reviewed and reveal LBBB  Echo 3/15 is reviewed   Assessment and Plan:  1. Chronic systolic dysfunction  euvolemic today  She has NYHA Class III CHF and a LBBB. EF 20% 2/13 by echo (reviewed today)  Stable on an appropriate medical regimen  At this time, she meets criteria for ICD upgrade to CRT-D. Risks, benefits, alternatives to CRT upgrade were discussed in detail with the patient today. The patient understands that the risks include but are not limited to bleeding, infection, pneumothorax, perforation, tamponade, vascular damage, renal failure, MI, stroke, death,and lead dislodgement and wishes to proceed at this time.   ICD Criteria  Current LVEF:20% ;Obtained > or = 1 month ago and < or = 3 months ago.  NYHA Functional Classification: Class III  Heart Failure History:  Yes, Duration of heart failure since onset is > 9 months  Non-Ischemic Dilated Cardiomyopathy History:  Ischemic cardiomyopathy  Atrial Fibrillation/Atrial Flutter:  No.  Ventricular  Tachycardia History:  Yes- nonsustained VF observed on ICD 2013.  Spontaneously terminated  Cardiac Arrest History:  No  History of Syndromes with Risk of Sudden Death:  No.  Previous ICD:  Yes, ICD Type:  Dual, Reason for ICD:  Primary prevention.  25%  Electrophysiology Study: No.  Prior MI: yes- greater than 40 days  PPM: No.  OSA:  No  Patient Life Expectancy of >=1 year: Yes.  Anticoagulation Therapy:  Patient is NOT on anticoagulation therapy.   Beta Blocker Therapy:  Yes.   Ace Inhibitor/ARB Therapy:  Yes.

## 2013-11-16 NOTE — Discharge Summary (Signed)
ELECTROPHYSIOLOGY PROCEDURE DISCHARGE SUMMARY    Patient ID: Tracy Mills,  MRN: [id number], DOB/AGE: May 01, 1946 68 y.o.  Admit date: 11/16/2013 Discharge date: 11/18/2013  Primary Care Physician: Ignatius Specking., MD Primary Cardiologist: Hochrein Electrophysiologist: Nicolas Sisler  Primary Discharge Diagnosis:  Ischemic cardiomyopathy, congestive heart failure, LBBB status post CRTD upgrade this admission  Secondary Discharge Diagnosis:  1.  Hypertension 2.  CAD - anterior MI 2010; LAD stent placement 2010 3.  Moderate to severe MR   Allergies  Allergen Reactions  . Penicillins Hives  . Tetracyclines & Related Other (See Comments)    "yeast infections"     Procedures This Admission:  1.  Upgrade of previously implanted dual chamber ICD to a Medtronic CRTD on 11-16-13 by Dr Johney Frame.  The patient received a Medtronic [address] XT CRTD with model number 4598 LV lead.  The patient's previously implanted 5076 right atrial lead and 6947 right ventricular lead were used.  DFT's were deferred at time of implant.  There were no early apparent complications. 2.  CXR on 11-17-13 demonstrated stable leads, no obvious pneumothorax  Brief HPI: Tracy Mills is a 68 y.o. female with an ischemic CM (EF 20%), NYHA Class III CHF, and LBBB QRS morophology. She is s/p MDT ICD previously for primary prevention of sudden death. She has a LBBB with QRS > 150 msec and is expected to benefit from resynchronization therapy. The patient has been treated with an optimal medical regimen but continues to have a depressed ejection fraction and NYHA Class III CHF symptoms. She therefore presents today for a biventricular ICD upgrade.  Hospital Course:  The patient was admitted and underwent upgrade of a previously implanted dual chamber ICD to CRTD with details as outlined above.   She was monitored on telemetry overnight which demonstrated sinus rhythm.  Left chest was without hematoma or ecchymosis.  The  device was interrogated and found to be functioning normally.  CXR was obtained and demonstrated no pneumothorax status post device implantation.  Wound care, arm mobility, and restrictions were reviewed with the patient.  Dr Johney Frame examined the patient and considered them stable for discharge to home.    Discharge Vitals: Blood pressure 91/60, pulse 60, temperature 97.5 F (36.4 C), temperature source Oral, resp. rate 18, height 5\' 3"  (1.6 m), weight 134 lb 12.8 oz (61.145 kg), SpO2 94.00%.  Labs: Physical Exam: Filed Vitals:   11/16/13 1800 11/16/13 1854 11/16/13 2055 11/17/13 0514  BP: 84/59 98/72 82/52  91/60  Pulse: 79 68 69 60  Temp:   97.8 F (36.6 C) 97.5 F (36.4 C)  TempSrc:   Oral Oral  Resp:   18 18  Height:      Weight:    134 lb 12.8 oz (61.145 kg)  SpO2:   95% 94%    GEN- The patient is well appearing, alert and oriented x 3 today.   Head- normocephalic, atraumatic Eyes-  Sclera clear, conjunctiva pink Ears- hearing intact Oropharynx- clear Neck- supple,  Lungs- Clear to ausculation bilaterally, normal work of breathing Heart- Regular rate and rhythm  GI- soft, NT, ND, + BS Extremities- no clubbing, cyanosis, or edema MS- no significant deformity or atrophy Skin- ICD pocket is without hematoma     Lab Results  Component Value Date   WBC 4.9 11/16/2013   HGB 14.1 11/16/2013   HCT 41.1 11/16/2013   MCV 91.7 11/16/2013   PLT 271 11/16/2013     Recent Labs Lab 11/17/13 0420  NA 137  K 4.3  CL 102  CO2 18*  BUN 30*  CREATININE 1.36*  CALCIUM 9.6  GLUCOSE 85    Discharge Medications:    Medication List         acetaminophen 500 MG tablet  Commonly known as:  TYLENOL  Take 500 mg by mouth daily.     aspirin 81 MG tablet  Take 81 mg by mouth daily.     carvedilol 3.125 MG tablet  Commonly known as:  COREG  Take 1 tablet (3.125 mg total) by mouth 2 (two) times daily.     furosemide 40 MG tablet  Commonly known as:  LASIX  Take 1 tablet (40  mg total) by mouth daily as needed. Take once daily as needed for swelling or weight gain, as directed by Dr. Johney FrameAllred.     losartan 50 MG tablet  Commonly known as:  COZAAR  Take 1 tablet (50 mg total) by mouth daily.     mirtazapine 15 MG tablet  Commonly known as:  REMERON  Take 15 mg by mouth at bedtime as needed (restlessness).     nitroGLYCERIN 0.4 MG SL tablet  Commonly known as:  NITROSTAT  Place 0.4 mg under the tongue every 5 (five) minutes as needed for chest pain.     omeprazole 20 MG capsule  Commonly known as:  PRILOSEC  Take 20 mg by mouth daily.     simvastatin 40 MG tablet  Commonly known as:  ZOCOR  Take 40 mg by mouth daily.     spironolactone 25 MG tablet  Commonly known as:  ALDACTONE  Take 1 tablet (25 mg total) by mouth daily.       Follow-up Information   [facility] On 11/29/2013. (At 4:00 PM for wound check)    Specialty:  Cardiology   Contact information:   [address], [address] [place] [address] [phone]      Follow up with Laqueta LindenKONESWARAN, SURESH A, MD On 02/04/2014. (At 10:40 AM)    Specialty:  Cardiology   Contact information:   [address] [place] [address] [phone]       Follow up with Hillis RangeJames Jakiah Bienaime, MD On 02/18/2014. (At 9:00 AM)    Specialty:  Cardiology   Contact information:   [address] [place] [address] [phone]       Duration of Discharge Encounter: Greater than 30 minutes including physician time.  Signed,  Hillis RangeJames Donye Campanelli MD

## 2013-11-17 ENCOUNTER — Encounter (HOSPITAL_COMMUNITY): Payer: Self-pay | Admitting: *Deleted

## 2013-11-17 ENCOUNTER — Ambulatory Visit (HOSPITAL_COMMUNITY): Payer: Medicare HMO

## 2013-11-17 ENCOUNTER — Other Ambulatory Visit: Payer: Self-pay | Admitting: *Deleted

## 2013-11-17 ENCOUNTER — Telehealth: Payer: Self-pay | Admitting: *Deleted

## 2013-11-17 DIAGNOSIS — I5022 Chronic systolic (congestive) heart failure: Secondary | ICD-10-CM

## 2013-11-17 DIAGNOSIS — I2510 Atherosclerotic heart disease of native coronary artery without angina pectoris: Secondary | ICD-10-CM

## 2013-11-17 DIAGNOSIS — I509 Heart failure, unspecified: Secondary | ICD-10-CM

## 2013-11-17 DIAGNOSIS — I428 Other cardiomyopathies: Secondary | ICD-10-CM

## 2013-11-17 LAB — BASIC METABOLIC PANEL
BUN: 30 mg/dL — ABNORMAL HIGH (ref 6–23)
CHLORIDE: 102 meq/L (ref 96–112)
CO2: 18 meq/L — AB (ref 19–32)
Calcium: 9.6 mg/dL (ref 8.4–10.5)
Creatinine, Ser: 1.36 mg/dL — ABNORMAL HIGH (ref 0.50–1.10)
GFR calc Af Amer: 46 mL/min — ABNORMAL LOW (ref >90)
GFR calc non Af Amer: 39 mL/min — ABNORMAL LOW (ref >90)
GLUCOSE: 85 mg/dL (ref 70–99)
POTASSIUM: 4.3 meq/L (ref 3.7–5.3)
SODIUM: 137 meq/L (ref 137–147)

## 2013-11-17 MED ORDER — FUROSEMIDE 40 MG PO TABS: 40.0000 mg | ORAL_TABLET | Freq: Every day | ORAL | Status: DC | PRN

## 2013-11-17 NOTE — Discharge Instructions (Signed)
° °  Supplemental Discharge Instructions for  Pacemaker/Defibrillator Patients  Activity No heavy lifting or vigorous activity with your left/right arm for 6 to 8 weeks.  Do not raise your left/right arm above your head for one week.  Gradually raise your affected arm as drawn below.           06/27                      06/28                       06/29                      06/30       NO DRIVING for 1 week; you may begin driving on 33/29/5188. WOUND CARE   Keep the wound area clean and dry.  Do not get this area wet for one week. No showers for one week; you may shower on 11/24/2013.   The tape/steri-strips on your wound will fall off; do not pull them off.  No bandage is needed on the site.  DO NOT apply any creams, oils, or ointments to the wound area.   If you notice any drainage or discharge from the wound, any swelling or bruising at the site, or you develop a fever > 101? F after you are discharged home, call the office at once.  Special Instructions   You are still able to use cellular telephones; use the ear opposite the side where you have your pacemaker/defibrillator.  Avoid carrying your cellular phone near your device.   When traveling through airports, show security personnel your identification card to avoid being screened in the metal detectors.  Ask the security personnel to use the hand wand.   Avoid arc welding equipment, MRI testing (magnetic resonance imaging), TENS units (transcutaneous nerve stimulators).  Call the office for questions about other devices.   Avoid electrical appliances that are in poor condition or are not properly grounded.   Microwave ovens are safe to be near or to operate.  Additional information for defibrillator patients should your device go off:   If your device goes off ONCE and you feel fine afterward, notify the device clinic nurses.   If your device goes off ONCE and you do not feel well afterward, call 911.   If your device goes off TWICE,  call 911.   If your device goes off THREE times in one day, call 911.  DO NOT DRIVE YOURSELF OR A FAMILY MEMBER WITH A DEFIBRILLATOR TO THE HOSPITAL--CALL 911.

## 2013-11-17 NOTE — Telephone Encounter (Signed)
Patient is requesting that Dr Johney Frame prescribe her something for pain. She is aware that this is not something that we normally do but asks that I send you a message. She would like a call back at [phone]. Thanks, MI

## 2013-11-17 NOTE — Telephone Encounter (Signed)
Spoke with patient and let her know Dr Johney Frame does not prescribe pain meds and she can take Tylenol as needed as she can not take Ibuprofen

## 2013-11-25 ENCOUNTER — Encounter: Payer: Self-pay | Admitting: Internal Medicine

## 2013-11-29 ENCOUNTER — Encounter: Payer: Self-pay | Admitting: Internal Medicine

## 2013-11-29 ENCOUNTER — Other Ambulatory Visit (INDEPENDENT_AMBULATORY_CARE_PROVIDER_SITE_OTHER): Payer: Medicare HMO

## 2013-11-29 ENCOUNTER — Ambulatory Visit (INDEPENDENT_AMBULATORY_CARE_PROVIDER_SITE_OTHER): Payer: Medicare HMO | Admitting: *Deleted

## 2013-11-29 DIAGNOSIS — I2589 Other forms of chronic ischemic heart disease: Secondary | ICD-10-CM

## 2013-11-29 DIAGNOSIS — I5022 Chronic systolic (congestive) heart failure: Secondary | ICD-10-CM

## 2013-11-29 DIAGNOSIS — I428 Other cardiomyopathies: Secondary | ICD-10-CM

## 2013-11-29 DIAGNOSIS — I509 Heart failure, unspecified: Secondary | ICD-10-CM

## 2013-11-29 DIAGNOSIS — I255 Ischemic cardiomyopathy: Secondary | ICD-10-CM

## 2013-11-29 LAB — MDC_IDC_ENUM_SESS_TYPE_INCLINIC
Battery Remaining Longevity: 102 mo
Battery Voltage: 3.08 V
Brady Statistic AP VP Percent: 62.75 %
Brady Statistic AP VS Percent: 1.12 %
Brady Statistic AS VP Percent: 35.57 %
Brady Statistic AS VS Percent: 0.56 %
Brady Statistic RV Percent Paced: 33.6 %
Date Time Interrogation Session: 20150706171954
HIGH POWER IMPEDANCE MEASURED VALUE: 228 Ohm
HIGH POWER IMPEDANCE MEASURED VALUE: 41 Ohm
HighPow Impedance: 52 Ohm
Lead Channel Impedance Value: 475 Ohm
Lead Channel Pacing Threshold Amplitude: 0.75 V
Lead Channel Pacing Threshold Pulse Width: 0.4 ms
Lead Channel Sensing Intrinsic Amplitude: 2.25 mV
Lead Channel Setting Pacing Amplitude: 2 V
Lead Channel Setting Pacing Amplitude: 2.5 V
Lead Channel Setting Pacing Pulse Width: 0.4 ms
Lead Channel Setting Pacing Pulse Width: 0.4 ms
Lead Channel Setting Sensing Sensitivity: 0.3 mV
MDC IDC MSMT LEADCHNL LV PACING THRESHOLD AMPLITUDE: 1 V
MDC IDC MSMT LEADCHNL LV PACING THRESHOLD PULSEWIDTH: 0.4 ms
MDC IDC MSMT LEADCHNL RA IMPEDANCE VALUE: 475 Ohm
MDC IDC MSMT LEADCHNL RA PACING THRESHOLD AMPLITUDE: 0.625 V
MDC IDC MSMT LEADCHNL RA PACING THRESHOLD PULSEWIDTH: 0.4 ms
MDC IDC MSMT LEADCHNL RA SENSING INTR AMPL: 2.625 mV
MDC IDC MSMT LEADCHNL RV SENSING INTR AMPL: 29.625 mV
MDC IDC MSMT LEADCHNL RV SENSING INTR AMPL: 31.625 mV
MDC IDC SET LEADCHNL RA PACING AMPLITUDE: 1.5 V
MDC IDC SET ZONE DETECTION INTERVAL: 400 ms
MDC IDC STAT BRADY RA PERCENT PACED: 63.86 %
Zone Setting Detection Interval: 300 ms
Zone Setting Detection Interval: 350 ms
Zone Setting Detection Interval: 400 ms

## 2013-11-29 NOTE — Progress Notes (Signed)
Wound check appointment. Steri-strips removed. Wound without redness or edema. Incision edges approximated, wound well healed. Normal device function. Thresholds, sensing, and impedances consistent with implant measurements. Device programmed at chronic settings for RA & RV, 2.5V@0 .31ms for LV. Histogram distribution appropriate for patient and level of activity. No mode switches or ventricular arrhythmias noted. Patient educated about wound care, arm mobility, lifting restrictions, shock plan. ROV w/ Dr. Johney Frame 02/18/14.

## 2013-11-30 LAB — BASIC METABOLIC PANEL
BUN: 25 mg/dL — AB (ref 6–23)
CHLORIDE: 101 meq/L (ref 96–112)
CO2: 27 mEq/L (ref 19–32)
Calcium: 10 mg/dL (ref 8.4–10.5)
Creatinine, Ser: 1.5 mg/dL — ABNORMAL HIGH (ref 0.4–1.2)
GFR: 45.15 mL/min — AB (ref >60.00)
GLUCOSE: 80 mg/dL (ref 70–99)
POTASSIUM: 4.5 meq/L (ref 3.5–5.1)
Sodium: 137 mEq/L (ref 135–145)

## 2013-12-03 ENCOUNTER — Telehealth: Payer: Self-pay | Admitting: *Deleted

## 2013-12-03 NOTE — Telephone Encounter (Signed)
Pt notices stinging sensations around her device pocket when laying down. I advised pt to change positions while sitting or laying if she notices those stinging pains. I explained to pt that programming or medicinal changes cannot affect the sensations.

## 2013-12-15 ENCOUNTER — Other Ambulatory Visit: Payer: Self-pay | Admitting: *Deleted

## 2013-12-15 MED ORDER — CARVEDILOL 3.125 MG PO TABS: 3.1250 mg | ORAL_TABLET | Freq: Two times a day (BID) | ORAL | Status: DC

## 2013-12-15 MED ORDER — SIMVASTATIN 40 MG PO TABS: 40.0000 mg | ORAL_TABLET | Freq: Every day | ORAL | Status: DC

## 2013-12-15 MED ORDER — SPIRONOLACTONE 25 MG PO TABS: 25.0000 mg | ORAL_TABLET | Freq: Every day | ORAL | Status: DC

## 2013-12-15 MED ORDER — FUROSEMIDE 40 MG PO TABS: 40.0000 mg | ORAL_TABLET | Freq: Every day | ORAL | Status: DC | PRN

## 2013-12-17 ENCOUNTER — Other Ambulatory Visit: Payer: Self-pay | Admitting: *Deleted

## 2013-12-17 MED ORDER — SIMVASTATIN 40 MG PO TABS: 40.0000 mg | ORAL_TABLET | Freq: Every day | ORAL | Status: DC

## 2013-12-23 ENCOUNTER — Encounter: Payer: Self-pay | Admitting: *Deleted

## 2013-12-28 ENCOUNTER — Other Ambulatory Visit: Payer: Self-pay | Admitting: *Deleted

## 2013-12-28 MED ORDER — FUROSEMIDE 40 MG PO TABS
40.0000 mg | ORAL_TABLET | Freq: Every day | ORAL | Status: DC | PRN
Start: 2013-12-28 — End: 2014-08-23

## 2013-12-28 MED ORDER — SPIRONOLACTONE 25 MG PO TABS: 25.0000 mg | ORAL_TABLET | Freq: Every day | ORAL | Status: DC

## 2013-12-28 MED ORDER — CARVEDILOL 3.125 MG PO TABS: 3.1250 mg | ORAL_TABLET | Freq: Two times a day (BID) | ORAL | Status: DC

## 2014-01-10 ENCOUNTER — Other Ambulatory Visit: Payer: Self-pay | Admitting: *Deleted

## 2014-01-10 MED ORDER — LOSARTAN POTASSIUM 50 MG PO TABS: 50.0000 mg | ORAL_TABLET | Freq: Every day | ORAL | Status: DC

## 2014-01-24 ENCOUNTER — Encounter: Payer: Medicare HMO | Admitting: *Deleted

## 2014-01-27 ENCOUNTER — Encounter: Payer: Self-pay | Admitting: *Deleted

## 2014-01-27 ENCOUNTER — Telehealth: Payer: Self-pay | Admitting: *Deleted

## 2014-01-27 ENCOUNTER — Ambulatory Visit (INDEPENDENT_AMBULATORY_CARE_PROVIDER_SITE_OTHER): Payer: Medicare HMO | Admitting: *Deleted

## 2014-01-27 DIAGNOSIS — Z95810 Presence of automatic (implantable) cardiac defibrillator: Secondary | ICD-10-CM

## 2014-01-27 DIAGNOSIS — I509 Heart failure, unspecified: Secondary | ICD-10-CM

## 2014-01-27 DIAGNOSIS — I5022 Chronic systolic (congestive) heart failure: Secondary | ICD-10-CM

## 2014-01-27 NOTE — Telephone Encounter (Signed)
I spoke with the patient. She is aware to transmit.

## 2014-01-27 NOTE — Telephone Encounter (Signed)
I left a message for the patient to call. Need to send manual transmission for ICM clinic.

## 2014-01-27 NOTE — Telephone Encounter (Signed)
Follow up ° ° ° ° ° °Returning Tracy Mills's call °

## 2014-01-27 NOTE — Progress Notes (Signed)
EPIC Encounter for ICM Monitoring  Patient Name: Tracy Mills is a 68 y.o. female Date: 01/27/2014 Primary Care Physican: Ignatius Specking., MD Primary Cardiologist: Purvis Sheffield Electrophysiologist: Allred Dry Weight: 126 lbs       In the past month, have you:  1. Gained more than 2 pounds in a day or more than 5 pounds in a week? no  2. Had changes in your medications (with verification of current medications)? no  3. Had more shortness of breath than is usual for you? no  4. Limited your activity because of shortness of breath? no  5. Not been able to sleep because of shortness of breath? no  6. Had increased swelling in your feet or ankles? no  7. Had symptoms of dehydration (dizziness, dry mouth, increased thirst, decreased urine output) no  8. Had changes in sodium restriction? no  9. Been compliant with medication? Yes  ** The patient had a Bi-V ICD upgrade on 11/16/13 with Dr. Johney Frame **   ICM trend:   Follow-up plan: ICM clinic phone appointment: 03/21/14. Follow up appointments scheduled with Dr. Purvis Sheffield- 02/04/14 & Dr. Johney Frame - 02/18/14.  Copy of note sent to patient's primary care physician, primary cardiologist, and device following physician.  Sherri Rad, RN, BSN 01/27/2014 5:27 PM

## 2014-01-27 NOTE — Telephone Encounter (Signed)
Transmission received.

## 2014-02-04 ENCOUNTER — Ambulatory Visit (INDEPENDENT_AMBULATORY_CARE_PROVIDER_SITE_OTHER): Payer: Medicare HMO | Admitting: Cardiovascular Disease

## 2014-02-04 ENCOUNTER — Encounter: Payer: Self-pay | Admitting: Cardiovascular Disease

## 2014-02-04 VITALS — BP 106/70 | HR 79 | Ht 63.0 in | Wt 131.0 lb

## 2014-02-04 DIAGNOSIS — I10 Essential (primary) hypertension: Secondary | ICD-10-CM

## 2014-02-04 DIAGNOSIS — I2589 Other forms of chronic ischemic heart disease: Secondary | ICD-10-CM

## 2014-02-04 DIAGNOSIS — I255 Ischemic cardiomyopathy: Secondary | ICD-10-CM

## 2014-02-04 DIAGNOSIS — Z720 Tobacco use: Secondary | ICD-10-CM

## 2014-02-04 DIAGNOSIS — F17200 Nicotine dependence, unspecified, uncomplicated: Secondary | ICD-10-CM

## 2014-02-04 DIAGNOSIS — I5022 Chronic systolic (congestive) heart failure: Secondary | ICD-10-CM

## 2014-02-04 DIAGNOSIS — Z95810 Presence of automatic (implantable) cardiac defibrillator: Secondary | ICD-10-CM

## 2014-02-04 DIAGNOSIS — I2510 Atherosclerotic heart disease of native coronary artery without angina pectoris: Secondary | ICD-10-CM

## 2014-02-04 DIAGNOSIS — I509 Heart failure, unspecified: Secondary | ICD-10-CM

## 2014-02-04 DIAGNOSIS — E785 Hyperlipidemia, unspecified: Secondary | ICD-10-CM

## 2014-02-04 MED ORDER — LOSARTAN POTASSIUM 50 MG PO TABS: 50.0000 mg | ORAL_TABLET | Freq: Every day | ORAL | Status: DC

## 2014-02-04 NOTE — Patient Instructions (Signed)
Your physician wants you to follow-up in: 6 months with Dr. Koneswaran. You will receive a reminder letter in the mail two months in advance. If you don't receive a letter, please call our office to schedule the follow-up appointment.  Your physician recommends that you continue on your current medications as directed. Please refer to the Current Medication list given to you today.  Thank you for choosing [facility]!   

## 2014-02-04 NOTE — Progress Notes (Signed)
Patient ID: LAIANNA BAZA, female   DOB: 03-26-1946, 68 y.o.   MRN: [id number]      SUBJECTIVE: The patient is here to followup for CAD and ischemic cardiomyopathy with chronic systolic heart failure. Weight is 131 pounds today (130 lbs in March at last visit with me).  She underwent CRT-D upgrade on 11/16/13 by Dr. Johney Frame. She is feeling well and denies chest pain, shortness of breath, palpitations, orthopnea, PND, abdominal and leg swelling.  Review of Systems: As per "subjective", otherwise negative.  Allergies  Allergen Reactions  . Penicillins Hives  . Tetracyclines & Related Other (See Comments)    "yeast infections"    Current Outpatient Prescriptions  Medication Sig Dispense Refill  . acetaminophen (TYLENOL) 500 MG tablet Take 500 mg by mouth daily.      Marland Kitchen aspirin 81 MG tablet Take 81 mg by mouth daily.        . carvedilol (COREG) 3.125 MG tablet Take 1 tablet (3.125 mg total) by mouth 2 (two) times daily.  180 tablet  3  . furosemide (LASIX) 40 MG tablet Take 1 tablet (40 mg total) by mouth daily as needed. Take once daily as needed for swelling or weight gain, as directed by Dr. Johney Frame.  90 tablet  3  . losartan (COZAAR) 50 MG tablet Take 1 tablet (50 mg total) by mouth daily.  30 tablet  3  . mirtazapine (REMERON) 15 MG tablet Take 15 mg by mouth at bedtime as needed (restlessness).       . nitroGLYCERIN (NITROSTAT) 0.4 MG SL tablet Place 0.4 mg under the tongue every 5 (five) minutes as needed for chest pain.      Marland Kitchen omeprazole (PRILOSEC) 20 MG capsule Take 20 mg by mouth daily.      . simvastatin (ZOCOR) 40 MG tablet Take 1 tablet (40 mg total) by mouth daily.  90 tablet  3  . spironolactone (ALDACTONE) 25 MG tablet Take 1 tablet (25 mg total) by mouth daily.  90 tablet  3   No current facility-administered medications for this visit.    Past Medical History  Diagnosis Date  . Chronic systolic congestive heart failure   . Hypertension   . Tobacco abuse   . Coronary  artery disease     anterior MI in 2010. LAD stent placement 2010. EF: 15%  . MR (mitral regurgitation)     moderate to severe  . LBBB (left bundle branch block)   . Ischemic cardiomyopathy   . VF (ventricular fibrillation) 2013    nonsustained VF, spontaneously termianted    Past Surgical History  Procedure Laterality Date  . Cardiac defibrillator placement  06/14/09    MDT [facility] Dr Johney Frame  . Tonsillectomy and adenoidectomy    . Tubal ligation    . Cardiac catheterization  2010    LAD PCI and stent placement  . Bi-ventricular implantable cardioverter defibrillator upgrade  11-16-2013    upgrade of previously implanted dual chamber ICD to MDT Ovidio Kin XT CRTD by Dr Johney Frame    History   Social History  . Marital Status: Single    Spouse Name: N/A    Number of Children: N/A  . Years of Education: N/A   Occupational History  . RETIRED     retired bus Office manager office   Social History Main Topics  . Smoking status: Former Smoker -- 0.20 packs/day for 40 years    Types: Cigarettes    Quit date: 12/25/2013  .  Smokeless tobacco: Never Used     Comment: smokes socially/ smokes 2 cigarettes per month, trying to quit  . Alcohol Use: Yes     Comment: occasional alcohol use  . Drug Use: No  . Sexual Activity: No   Other Topics Concern  . Not on file   Social History Narrative   Moved to [place] from [place].  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  Lungs: Diminished but clear b/l CV: Nondisplaced PMI. Regular rate and rhythm, normal S1/S2, no S3/S4, no murmur. No pretibial or periankle edema. No carotid bruit. Normal pedal pulses.  Abdomen: Soft, nontender, no hepatosplenomegaly, no distention.  Neurologic: Alert and oriented x 3.  Psych: Normal  affect. Skin: Normal. Musculoskeletal: Normal range of motion, no gross deformities. Extremities: No clubbing or cyanosis.   ECG: Most recent ECG reviewed.      ASSESSMENT AND PLAN:  Chronic systolic congestive heart failure  Continue Lasix 40 mg daily as needed for maintenance. Continue spironolactone 25 mg daily. She is euvolemic and compensated today. Will need to continue to follow renal function and potassium closely. GFR 45 ml/min on 11/29/13.  Ischemic cardiomyopathy  Quiescent on current medication regimen. Prior echo on 08/04/13 showed EF 20%. This will need to be repeated in the future. Stress test in 2012 showed only myocardial scar, without ischemia. She is taking ASA 81 mg daily.   Ventricular tachycardia  Status post CRT-D upgrade on 11/16/13. Device interrogation on 11/29/2013 demonstrated normal device function with no mode switches nor ventricular arrhythmias. Followed by Dr. Johney Frame.   Essential hypertension  Controlled on present therapy.  Hyperlipidemia  Followed by primary M.D. Will request most recent lab results.   CKD stage 3 GFR 45 ml/min on 11/29/13.  Dispo: f/u 6 months.    Prentice Docker, M.D., F.A.C.C.

## 2014-02-18 ENCOUNTER — Ambulatory Visit (INDEPENDENT_AMBULATORY_CARE_PROVIDER_SITE_OTHER): Payer: Medicare HMO | Admitting: Internal Medicine

## 2014-02-18 ENCOUNTER — Encounter: Payer: Self-pay | Admitting: Internal Medicine

## 2014-02-18 VITALS — BP 113/71 | HR 85 | Ht 62.0 in | Wt 135.0 lb

## 2014-02-18 DIAGNOSIS — I472 Ventricular tachycardia, unspecified: Secondary | ICD-10-CM

## 2014-02-18 DIAGNOSIS — Z720 Tobacco use: Secondary | ICD-10-CM

## 2014-02-18 DIAGNOSIS — F17200 Nicotine dependence, unspecified, uncomplicated: Secondary | ICD-10-CM

## 2014-02-18 DIAGNOSIS — I509 Heart failure, unspecified: Secondary | ICD-10-CM

## 2014-02-18 DIAGNOSIS — I2589 Other forms of chronic ischemic heart disease: Secondary | ICD-10-CM

## 2014-02-18 DIAGNOSIS — I4729 Other ventricular tachycardia: Secondary | ICD-10-CM

## 2014-02-18 DIAGNOSIS — I5022 Chronic systolic (congestive) heart failure: Secondary | ICD-10-CM

## 2014-02-18 DIAGNOSIS — I255 Ischemic cardiomyopathy: Secondary | ICD-10-CM

## 2014-02-18 LAB — MDC_IDC_ENUM_SESS_TYPE_INCLINIC
Brady Statistic AP VS Percent: 0.9 %
Brady Statistic AS VP Percent: 41.8 %
Brady Statistic AS VS Percent: 1.1 %
Lead Channel Impedance Value: 513 Ohm
Lead Channel Pacing Threshold Amplitude: 0.5 V
Lead Channel Pacing Threshold Amplitude: 1 V
Lead Channel Sensing Intrinsic Amplitude: 1.8 mV
Lead Channel Sensing Intrinsic Amplitude: 20 mV
Lead Channel Setting Pacing Amplitude: 1.5 V
Lead Channel Setting Sensing Sensitivity: 0.3 mV
MDC IDC MSMT LEADCHNL LV IMPEDANCE VALUE: 817 Ohm
MDC IDC MSMT LEADCHNL LV PACING THRESHOLD PULSEWIDTH: 0.4 ms
MDC IDC MSMT LEADCHNL RA IMPEDANCE VALUE: 456 Ohm
MDC IDC MSMT LEADCHNL RA PACING THRESHOLD PULSEWIDTH: 0.4 ms
MDC IDC MSMT LEADCHNL RV PACING THRESHOLD AMPLITUDE: 1 V
MDC IDC MSMT LEADCHNL RV PACING THRESHOLD PULSEWIDTH: 0.4 ms
MDC IDC SET LEADCHNL LV PACING AMPLITUDE: 2.5 V
MDC IDC SET LEADCHNL LV PACING PULSEWIDTH: 0.4 ms
MDC IDC SET LEADCHNL RV PACING AMPLITUDE: 2 V
MDC IDC SET LEADCHNL RV PACING PULSEWIDTH: 0.4 ms
MDC IDC SET ZONE DETECTION INTERVAL: 400 ms
MDC IDC STAT BRADY AP VP PERCENT: 55.2 %
Zone Setting Detection Interval: 300 ms
Zone Setting Detection Interval: 350 ms
Zone Setting Detection Interval: 400 ms

## 2014-02-18 NOTE — Patient Instructions (Addendum)
Your physician recommends that you schedule a follow-up appointment in: 1 year. You will receive a reminder letter in the mail in about 10 months reminding you to call and schedule your appointment. If you don't receive this letter, please contact our office. Carelink/device check on 05/23/14. Your physician recommends that you continue on your current medications as directed. Please refer to the Current Medication list given to you today. Your physician has requested that you have an echocardiogram in 3 months. Echocardiography is a painless test that uses sound waves to create images of your heart. It provides your doctor with information about the size and shape of your heart and how well your heart's chambers and valves are working. This procedure takes approximately one hour. There are no restrictions for this procedure. Stop smoking.

## 2014-02-18 NOTE — Addendum Note (Signed)
Addended by: Eustace Moore on: 02/18/2014 09:59 AM   Modules accepted: Orders

## 2014-02-18 NOTE — Progress Notes (Signed)
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 female who presents today for routine electrophysiology followup.  Since her recent CRT upgrade, the patient reports doing very well.  Her fatigue and SOB are improved.  She denies procedure related complications.  Today, she denies symptoms of palpitations, chest pain, shortness of breath at rest, lower extremity edema, dizziness, presyncope, syncope, or ICD shocks.  The patient is otherwise without complaint today.   Past Medical History  Diagnosis Date  . Chronic systolic congestive heart failure   . Hypertension   . Tobacco abuse   . Coronary artery disease     anterior MI in 2010. LAD stent placement 2010. EF: 15%  . MR (mitral regurgitation)     moderate to severe  . LBBB (left bundle branch block)   . Ischemic cardiomyopathy   . VF (ventricular fibrillation) 2013    nonsustained VF, spontaneously termianted   Past Surgical History  Procedure Laterality Date  . Cardiac defibrillator placement  06/14/09    MDT [facility] Dr Johney Frame  . Tonsillectomy and adenoidectomy    . Tubal ligation    . Cardiac catheterization  2010    LAD PCI and stent placement  . Bi-ventricular implantable cardioverter defibrillator upgrade  11-16-2013    upgrade of previously implanted dual chamber ICD to MDT Viva Quad XT CRTD by Dr Johney Frame    Current Outpatient Prescriptions  Medication Sig Dispense Refill  . acetaminophen (TYLENOL) 500 MG tablet Take 500 mg by mouth daily.      Marland Kitchen aspirin 81 MG tablet Take 81 mg by mouth daily.        . carvedilol (COREG) 3.125 MG tablet Take 1 tablet (3.125 mg total) by mouth 2 (two) times daily.  180 tablet  3  . furosemide (LASIX) 40 MG tablet Take 1 tablet (40 mg total) by mouth daily as needed. Take once daily as needed for swelling or weight gain, as directed by Dr. Johney Frame.  90 tablet  3  . losartan (COZAAR) 50 MG tablet Take 1 tablet (50 mg total) by mouth daily.  30 tablet  3   . mirtazapine (REMERON) 15 MG tablet Take 15 mg by mouth at bedtime as needed (restlessness).       . nitroGLYCERIN (NITROSTAT) 0.4 MG SL tablet Place 0.4 mg under the tongue every 5 (five) minutes as needed for chest pain.      Marland Kitchen omeprazole (PRILOSEC) 20 MG capsule Take 20 mg by mouth daily.      . simvastatin (ZOCOR) 40 MG tablet Take 1 tablet (40 mg total) by mouth daily.  90 tablet  3  . spironolactone (ALDACTONE) 25 MG tablet Take 1 tablet (25 mg total) by mouth daily.  90 tablet  3   No current facility-administered medications for this visit.    Physical Exam: Filed Vitals:   02/18/14 0906  BP: 113/71  Pulse: 85  Height:  (1.575 m)  Weight: 135 lb (61.236 kg)  SpO2: 99%    GEN- The patient is well appearing, alert and oriented x 3 today.   Head- normocephalic, atraumatic Eyes-  Sclera clear, conjunctiva pink Ears- hearing intact Oropharynx- clear Lungs- Clear to ausculation bilaterally, normal work of breathing Chest- ICD pocket is well healed Heart- Regular rate and rhythm, no murmurs, wide S2 split GI- soft, NT, ND, + BS Extremities- no clubbing, cyanosis, or edema  ICD interrogation- reviewed in detail today,  See PACEART report ekgs  are reviewed and reveal LBBB Echo 3/15 is reviewed  Assessment and Plan:  1.  Chronic systolic dysfunction euvolemic today Stable on an appropriate medical regimen Normal ICD function See Pace Art report No changes today Followed in ICM device clinic with Sherri Rad Repeat echo in 3 months to assess response to CRT  2. VT- previous NSVT Normal ICD function.  3. Tobacco Cessation is advised She is trying to quit  [facility]link, Sherri Rad is also following Return to see me in 1 year Dr Purvis Sheffield to see as scheduled

## 2014-03-21 ENCOUNTER — Ambulatory Visit (INDEPENDENT_AMBULATORY_CARE_PROVIDER_SITE_OTHER): Payer: Medicare HMO | Admitting: *Deleted

## 2014-03-21 ENCOUNTER — Encounter: Payer: Self-pay | Admitting: *Deleted

## 2014-03-21 DIAGNOSIS — I5022 Chronic systolic (congestive) heart failure: Secondary | ICD-10-CM

## 2014-03-21 DIAGNOSIS — Z95810 Presence of automatic (implantable) cardiac defibrillator: Secondary | ICD-10-CM

## 2014-03-21 NOTE — Progress Notes (Signed)
EPIC Encounter for ICM Monitoring  Patient Name: Tracy Mills is a 68 y.o. female Date: 03/21/2014 Primary Care Physican: Ignatius Specking., MD Primary Cardiologist: Purvis Sheffield Electrophysiologist: Allred Dry Weight: 132 lbs       In the past month, have you:  1. Gained more than 2 pounds in a day or more than 5 pounds in a week? Uncertain, she has not been weighing at home.  2. Had changes in your medications (with verification of current medications)? no  3. Had more shortness of breath than is usual for you? no  4. Limited your activity because of shortness of breath? no  5. Not been able to sleep because of shortness of breath? no  6. Had increased swelling in your feet or ankles? no  7. Had symptoms of dehydration (dizziness, dry mouth, increased thirst, decreased urine output) no  8. Had changes in sodium restriction? no  9. Been compliant with medication? Yes   ICM trend:   Follow-up plan: ICM clinic phone appointment: 04/25/14. The patient optivol readings have been trending up since ~10/15. Her impedence is below baseline currently. The patient is having no symptoms. I have asked her to increase her lasix to 60 mg daily x 2 days, then resume her normal dosing. She is agreeable and voices understanding. She is due for a repeat echo on 11/4- s/p CRT upgrade.  Copy of note sent to patient's primary care physician, primary cardiologist, and device following physician.  Sherri Rad, RN, BSN 03/21/2014 3:16 PM

## 2014-03-30 ENCOUNTER — Other Ambulatory Visit (INDEPENDENT_AMBULATORY_CARE_PROVIDER_SITE_OTHER): Payer: Medicare HMO

## 2014-03-30 ENCOUNTER — Other Ambulatory Visit: Payer: Self-pay

## 2014-03-30 DIAGNOSIS — I472 Ventricular tachycardia, unspecified: Secondary | ICD-10-CM

## 2014-03-30 DIAGNOSIS — Z720 Tobacco use: Secondary | ICD-10-CM

## 2014-03-30 DIAGNOSIS — I255 Ischemic cardiomyopathy: Secondary | ICD-10-CM

## 2014-03-30 DIAGNOSIS — I5022 Chronic systolic (congestive) heart failure: Secondary | ICD-10-CM

## 2014-04-06 ENCOUNTER — Telehealth: Payer: Self-pay | Admitting: *Deleted

## 2014-04-07 NOTE — Telephone Encounter (Signed)
Patient informed. 

## 2014-04-25 ENCOUNTER — Telehealth: Payer: Self-pay | Admitting: Cardiology

## 2014-04-25 ENCOUNTER — Encounter: Payer: Medicare HMO | Admitting: *Deleted

## 2014-04-25 NOTE — Telephone Encounter (Signed)
Spoke with pt and reminded pt of remote transmission that is due today. Pt verbalized understanding.   

## 2014-05-02 ENCOUNTER — Telehealth: Payer: Self-pay | Admitting: Internal Medicine

## 2014-05-02 NOTE — Telephone Encounter (Signed)
New Message  Pt called to make sure remote transmission was received//sr

## 2014-05-02 NOTE — Telephone Encounter (Signed)
Spoke with patient, transmission was not received.  She will resend later this pm.

## 2014-05-05 ENCOUNTER — Encounter (HOSPITAL_COMMUNITY): Payer: Self-pay | Admitting: Internal Medicine

## 2014-05-23 ENCOUNTER — Ambulatory Visit (INDEPENDENT_AMBULATORY_CARE_PROVIDER_SITE_OTHER): Payer: Medicare HMO | Admitting: *Deleted

## 2014-05-23 ENCOUNTER — Telehealth: Payer: Self-pay | Admitting: Cardiology

## 2014-05-23 DIAGNOSIS — Z95810 Presence of automatic (implantable) cardiac defibrillator: Secondary | ICD-10-CM

## 2014-05-23 DIAGNOSIS — I255 Ischemic cardiomyopathy: Secondary | ICD-10-CM

## 2014-05-23 DIAGNOSIS — I5022 Chronic systolic (congestive) heart failure: Secondary | ICD-10-CM

## 2014-05-23 NOTE — Telephone Encounter (Signed)
Spoke with pt and reminded pt of remote transmission that is due today. Pt verbalized understanding.   

## 2014-05-23 NOTE — Progress Notes (Signed)
Remote ICD transmission.   

## 2014-05-25 LAB — MDC_IDC_ENUM_SESS_TYPE_REMOTE
Battery Remaining Longevity: 97 mo
Battery Voltage: 2.99 V
Brady Statistic AP VP Percent: 45.66 %
Brady Statistic AS VP Percent: 51.57 %
Brady Statistic RA Percent Paced: 46.37 %
Brady Statistic RV Percent Paced: 21.68 %
Date Time Interrogation Session: 20151228202827
HIGH POWER IMPEDANCE MEASURED VALUE: 41 Ohm
HighPow Impedance: 49 Ohm
Lead Channel Impedance Value: 399 Ohm
Lead Channel Impedance Value: 418 Ohm
Lead Channel Impedance Value: 475 Ohm
Lead Channel Impedance Value: 646 Ohm
Lead Channel Impedance Value: 817 Ohm
Lead Channel Impedance Value: 836 Ohm
Lead Channel Pacing Threshold Amplitude: 0.5 V
Lead Channel Pacing Threshold Amplitude: 1 V
Lead Channel Pacing Threshold Amplitude: 1.25 V
Lead Channel Pacing Threshold Pulse Width: 0.4 ms
Lead Channel Pacing Threshold Pulse Width: 0.4 ms
Lead Channel Pacing Threshold Pulse Width: 0.4 ms
Lead Channel Sensing Intrinsic Amplitude: 2.25 mV
Lead Channel Setting Pacing Pulse Width: 0.4 ms
Lead Channel Setting Pacing Pulse Width: 0.4 ms
Lead Channel Setting Sensing Sensitivity: 0.3 mV
MDC IDC MSMT LEADCHNL LV IMPEDANCE VALUE: 513 Ohm
MDC IDC MSMT LEADCHNL LV IMPEDANCE VALUE: 513 Ohm
MDC IDC MSMT LEADCHNL LV IMPEDANCE VALUE: 779 Ohm
MDC IDC MSMT LEADCHNL LV IMPEDANCE VALUE: 779 Ohm
MDC IDC MSMT LEADCHNL LV IMPEDANCE VALUE: 836 Ohm
MDC IDC MSMT LEADCHNL RV IMPEDANCE VALUE: 361 Ohm
MDC IDC MSMT LEADCHNL RV IMPEDANCE VALUE: 475 Ohm
MDC IDC MSMT LEADCHNL RV SENSING INTR AMPL: 28.75 mV
MDC IDC SET LEADCHNL LV PACING AMPLITUDE: 2.25 V
MDC IDC SET LEADCHNL RA PACING AMPLITUDE: 2 V
MDC IDC SET LEADCHNL RV PACING AMPLITUDE: 2.5 V
MDC IDC SET ZONE DETECTION INTERVAL: 400 ms
MDC IDC SET ZONE DETECTION INTERVAL: 400 ms
MDC IDC STAT BRADY AP VS PERCENT: 0.71 %
MDC IDC STAT BRADY AS VS PERCENT: 2.07 %
Zone Setting Detection Interval: 300 ms
Zone Setting Detection Interval: 350 ms

## 2014-05-25 NOTE — Progress Notes (Signed)
EPIC Encounter for ICM Monitoring  Patient Name: Tracy Mills is a 68 y.o. female Date: 05/25/2014 Primary Care Physican: Ignatius Specking., MD Primary Cardiologist: Purvis Sheffield Electrophysiologist: Allred Dry Weight: 135 lbs       In the past month, have you:  1. Gained more than 2 pounds in a day or more than 5 pounds in a week? Uncertain. The patient has not been weighing at home. I have encouraged her to do this daily on her home scale.  2. Had changes in your medications (with verification of current medications)? no  3. Had more shortness of breath than is usual for you? no  4. Limited your activity because of shortness of breath? no  5. Not been able to sleep because of shortness of breath? no  6. Had increased swelling in your feet or ankles? no  7. Had symptoms of dehydration (dizziness, dry mouth, increased thirst, decreased urine output) no  8. Had changes in sodium restriction? Yes. She did eat some ham over the holidays. She has been drinking some ginger ale.   9. Been compliant with medication? Yes   ICM trend:   Follow-up plan: ICM clinic phone appointment: 06/01/14. The patient is not currently having symptoms with her elevated optivol readings. Readings have been elevated from ~ 10/28- present, but appear to be trending back to baseline. She is on aldactone 25 mg once daily and lasix 40 mg once daily. I have advised her to take an extra 1/2 tablet (20 mg) of her lasix for the next 3 days. She will send a 1 week follow transmission on 06/01/14.   Copy of note sent to patient's primary care physician, primary cardiologist, and device following physician.  Sherri Rad, RN, BSN 05/25/2014 2:08 PM

## 2014-05-25 NOTE — Addendum Note (Signed)
Addended by: Sherri Rad C on: 05/25/2014 02:13 PM   Modules accepted: Level of Service

## 2014-06-01 ENCOUNTER — Ambulatory Visit (INDEPENDENT_AMBULATORY_CARE_PROVIDER_SITE_OTHER): Payer: Medicare HMO | Admitting: *Deleted

## 2014-06-01 DIAGNOSIS — Z95810 Presence of automatic (implantable) cardiac defibrillator: Secondary | ICD-10-CM

## 2014-06-01 DIAGNOSIS — I5022 Chronic systolic (congestive) heart failure: Secondary | ICD-10-CM

## 2014-06-02 ENCOUNTER — Encounter: Payer: Self-pay | Admitting: *Deleted

## 2014-06-02 NOTE — Progress Notes (Signed)
EPIC Encounter for ICM Monitoring  Patient Name: Tracy Mills is a 69 y.o. female Date: 06/02/2014 Primary Care Physican: Ignatius Specking., MD Primary Cardiologist: Purvis Sheffield Electrophysiologist: Allred Dry Weight: 135 lbs       In the past month, have you:  1. Gained more than 2 pounds in a day or more than 5 pounds in a week? Uncertain. She has not been weighing at home.  2. Had changes in your medications (with verification of current medications)? Yes. This is a 1 week follow up ICM call. The patient's optivol readings were up from ~10/28 to 12/30. She was instructed to take an extra 20 mg of lasix x 3 days last week.  3. Had more shortness of breath than is usual for you? no  4. Limited your activity because of shortness of breath? no  5. Not been able to sleep because of shortness of breath? no  6. Had increased swelling in your feet or ankles? no  7. Had symptoms of dehydration (dizziness, dry mouth, increased thirst, decreased urine output) no  8. Had changes in sodium restriction? no  9. Been compliant with medication? Yes   ICM trend:   Follow-up plan: ICM clinic phone appointment: 07/04/14.  Copy of note sent to patient's primary care physician, primary cardiologist, and device following physician.  Sherri Rad, RN, BSN 06/02/2014 3:38 PM

## 2014-06-03 ENCOUNTER — Encounter: Payer: Self-pay | Admitting: *Deleted

## 2014-06-16 ENCOUNTER — Encounter: Payer: Self-pay | Admitting: Internal Medicine

## 2014-07-04 ENCOUNTER — Telehealth: Payer: Self-pay | Admitting: Cardiology

## 2014-07-04 ENCOUNTER — Encounter: Payer: Medicare HMO | Admitting: *Deleted

## 2014-07-04 NOTE — Telephone Encounter (Signed)
Spoke with pt and reminded pt of remote transmission that is due today. Pt verbalized understanding.   

## 2014-07-07 ENCOUNTER — Telehealth: Payer: Self-pay | Admitting: *Deleted

## 2014-07-07 NOTE — Telephone Encounter (Signed)
ICM transmission received. I left a message for the patient to call at her home/ cell #'s.

## 2014-07-21 ENCOUNTER — Encounter: Payer: Self-pay | Admitting: *Deleted

## 2014-07-21 NOTE — Telephone Encounter (Signed)
No call back from the patient. Next remote scheduled for 08/22/14. Letter mailed to the patient.

## 2014-08-22 ENCOUNTER — Telehealth: Payer: Self-pay | Admitting: Cardiology

## 2014-08-22 ENCOUNTER — Ambulatory Visit (INDEPENDENT_AMBULATORY_CARE_PROVIDER_SITE_OTHER): Payer: Medicare HMO | Admitting: *Deleted

## 2014-08-22 DIAGNOSIS — Z95810 Presence of automatic (implantable) cardiac defibrillator: Secondary | ICD-10-CM | POA: Diagnosis not present

## 2014-08-22 DIAGNOSIS — I255 Ischemic cardiomyopathy: Secondary | ICD-10-CM

## 2014-08-22 DIAGNOSIS — I5022 Chronic systolic (congestive) heart failure: Secondary | ICD-10-CM

## 2014-08-22 NOTE — Telephone Encounter (Signed)
Spoke with pt and reminded pt of remote transmission that is due today. Pt verbalized understanding.   

## 2014-08-22 NOTE — Progress Notes (Signed)
Remote ICD transmission.   

## 2014-08-23 ENCOUNTER — Encounter: Payer: Self-pay | Admitting: Cardiovascular Disease

## 2014-08-23 ENCOUNTER — Ambulatory Visit (INDEPENDENT_AMBULATORY_CARE_PROVIDER_SITE_OTHER): Payer: Medicare HMO | Admitting: Cardiovascular Disease

## 2014-08-23 VITALS — BP 109/76 | HR 80 | Ht 63.0 in | Wt 134.0 lb

## 2014-08-23 DIAGNOSIS — Z95810 Presence of automatic (implantable) cardiac defibrillator: Secondary | ICD-10-CM | POA: Diagnosis not present

## 2014-08-23 DIAGNOSIS — I472 Ventricular tachycardia, unspecified: Secondary | ICD-10-CM

## 2014-08-23 DIAGNOSIS — I5022 Chronic systolic (congestive) heart failure: Secondary | ICD-10-CM | POA: Diagnosis not present

## 2014-08-23 DIAGNOSIS — I10 Essential (primary) hypertension: Secondary | ICD-10-CM

## 2014-08-23 DIAGNOSIS — I255 Ischemic cardiomyopathy: Secondary | ICD-10-CM

## 2014-08-23 DIAGNOSIS — I2510 Atherosclerotic heart disease of native coronary artery without angina pectoris: Secondary | ICD-10-CM

## 2014-08-23 DIAGNOSIS — E785 Hyperlipidemia, unspecified: Secondary | ICD-10-CM

## 2014-08-23 DIAGNOSIS — I340 Nonrheumatic mitral (valve) insufficiency: Secondary | ICD-10-CM

## 2014-08-23 LAB — MDC_IDC_ENUM_SESS_TYPE_REMOTE
Battery Remaining Longevity: 94 mo
Battery Voltage: 2.99 V
Brady Statistic AP VS Percent: 0.58 %
Brady Statistic AS VS Percent: 1.03 %
Date Time Interrogation Session: 20160328180246
HighPow Impedance: 40 Ohm
HighPow Impedance: 52 Ohm
Lead Channel Impedance Value: 399 Ohm
Lead Channel Impedance Value: 399 Ohm
Lead Channel Impedance Value: 513 Ohm
Lead Channel Impedance Value: 608 Ohm
Lead Channel Impedance Value: 703 Ohm
Lead Channel Impedance Value: 874 Ohm
Lead Channel Pacing Threshold Pulse Width: 0.4 ms
Lead Channel Pacing Threshold Pulse Width: 0.4 ms
Lead Channel Sensing Intrinsic Amplitude: 2.5 mV
Lead Channel Sensing Intrinsic Amplitude: 31.625 mV
Lead Channel Setting Pacing Amplitude: 2 V
Lead Channel Setting Pacing Amplitude: 2.25 V
Lead Channel Setting Pacing Amplitude: 2.5 V
Lead Channel Setting Pacing Pulse Width: 0.4 ms
MDC IDC MSMT LEADCHNL LV IMPEDANCE VALUE: 418 Ohm
MDC IDC MSMT LEADCHNL LV IMPEDANCE VALUE: 532 Ohm
MDC IDC MSMT LEADCHNL LV IMPEDANCE VALUE: 722 Ohm
MDC IDC MSMT LEADCHNL LV IMPEDANCE VALUE: 874 Ohm
MDC IDC MSMT LEADCHNL LV PACING THRESHOLD AMPLITUDE: 1.25 V
MDC IDC MSMT LEADCHNL LV PACING THRESHOLD PULSEWIDTH: 0.4 ms
MDC IDC MSMT LEADCHNL RA IMPEDANCE VALUE: 418 Ohm
MDC IDC MSMT LEADCHNL RA PACING THRESHOLD AMPLITUDE: 0.5 V
MDC IDC MSMT LEADCHNL RV IMPEDANCE VALUE: 418 Ohm
MDC IDC MSMT LEADCHNL RV IMPEDANCE VALUE: 513 Ohm
MDC IDC MSMT LEADCHNL RV PACING THRESHOLD AMPLITUDE: 0.875 V
MDC IDC SET LEADCHNL RV PACING PULSEWIDTH: 0.4 ms
MDC IDC SET LEADCHNL RV SENSING SENSITIVITY: 0.3 mV
MDC IDC SET ZONE DETECTION INTERVAL: 350 ms
MDC IDC SET ZONE DETECTION INTERVAL: 400 ms
MDC IDC STAT BRADY AP VP PERCENT: 32.19 %
MDC IDC STAT BRADY AS VP PERCENT: 66.2 %
MDC IDC STAT BRADY RA PERCENT PACED: 32.77 %
MDC IDC STAT BRADY RV PERCENT PACED: 21.08 %
Zone Setting Detection Interval: 300 ms
Zone Setting Detection Interval: 400 ms

## 2014-08-23 MED ORDER — NITROGLYCERIN 0.4 MG SL SUBL: 0.4000 mg | SUBLINGUAL_TABLET | SUBLINGUAL | Status: DC | PRN

## 2014-08-23 NOTE — Patient Instructions (Addendum)
Nitroglycerin refill sent to pharmacy today.   Continue all other medications.   Your physician wants you to follow up in: 6 months.  You will receive a reminder letter in the mail one-two months in advance.  If you don't receive a letter, please call our office to schedule the follow up appointment

## 2014-08-23 NOTE — Progress Notes (Signed)
Patient ID: Tracy Mills, female   DOB: 01/24/1946, 69 y.o.   MRN: [id number]      SUBJECTIVE: The patient is here to followup for CAD and ischemic cardiomyopathy with chronic systolic heart failure. She underwent CRT-D upgrade on 11/16/13 by Dr. Johney Frame. She is feeling well and denies chest pain, shortness of breath, palpitations, orthopnea, PND, abdominal and leg swelling. Weight is 134  pounds today (131 lbs in 01/2014 at last visit with me).  She wants to start exercising at the [facility] this week.    Review of Systems: As per "subjective", otherwise negative.  Allergies  Allergen Reactions  . Penicillins Hives  . Tetracyclines & Related Other (See Comments)    "yeast infections"    Current Outpatient Prescriptions  Medication Sig Dispense Refill  . acetaminophen (TYLENOL) 500 MG tablet Take 500 mg by mouth daily.    Marland Kitchen aspirin 81 MG tablet Take 81 mg by mouth daily.      . carvedilol (COREG) 3.125 MG tablet Take 1 tablet (3.125 mg total) by mouth 2 (two) times daily. 180 tablet 3  . furosemide (LASIX) 40 MG tablet Take 40 mg by mouth daily.    Marland Kitchen losartan (COZAAR) 50 MG tablet Take 1 tablet (50 mg total) by mouth daily. 30 tablet 3  . mirtazapine (REMERON) 15 MG tablet Take 15 mg by mouth at bedtime as needed (restlessness).     . nitroGLYCERIN (NITROSTAT) 0.4 MG SL tablet Place 0.4 mg under the tongue every 5 (five) minutes as needed for chest pain.    . simvastatin (ZOCOR) 40 MG tablet Take 1 tablet (40 mg total) by mouth daily. 90 tablet 3  . spironolactone (ALDACTONE) 25 MG tablet Take 1 tablet (25 mg total) by mouth daily. 90 tablet 3   No current facility-administered medications for this visit.    Past Medical History  Diagnosis Date  . Chronic systolic congestive heart failure   . Hypertension   . Tobacco abuse   . Coronary artery disease     anterior MI in 2010. LAD stent placement 2010. EF: 15%  . MR (mitral regurgitation)     moderate to severe  . LBBB (left  bundle branch block)   . Ischemic cardiomyopathy   . VF (ventricular fibrillation) 2013    nonsustained VF, spontaneously termianted    Past Surgical History  Procedure Laterality Date  . Cardiac defibrillator placement  06/14/09    MDT [facility] Dr Johney Frame  . Tonsillectomy and adenoidectomy    . Tubal ligation    . Cardiac catheterization  2010    LAD PCI and stent placement  . Bi-ventricular implantable cardioverter defibrillator upgrade  11-16-2013    upgrade of previously implanted dual chamber ICD to MDT Viva Quad XT CRTD by Dr Johney Frame  . Bi-ventricular implantable cardioverter defibrillator upgrade N/A 11/16/2013    Procedure: BI-VENTRICULAR IMPLANTABLE CARDIOVERTER DEFIBRILLATOR UPGRADE;  Surgeon: Gardiner Rhyme, MD;  Location: [facility] CATH LAB;  Service: Cardiovascular;  Laterality: N/A;    History   Social History  . Marital Status: Single    Spouse Name: N/A  . Number of Children: N/A  . Years of Education: N/A   Occupational History  . RETIRED     retired bus Office manager office   Social History Main Topics  . Smoking status: Current Every Day Smoker -- 0.20 packs/day for 45 years    Types: Cigarettes    Start date: 04/07/1966  . Smokeless tobacco: Never Used  Comment: smokes socially/ smokes 2 cigarettes per month, trying to quit  . Alcohol Use: 0.0 oz/week    0 Standard drinks or equivalent per week     Comment: occasional alcohol use  . Drug Use: No  . Sexual Activity: No   Other Topics Concern  . Not on file   Social History Narrative   Moved to [place] from [place].  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 Neck: No JVD, no thyromegaly. Lungs: Clear to auscultation bilaterally with normal respiratory effort. CV: Regular rate and rhythm, normal S1/S2,  no S3/S4, no murmur. No pretibial or periankle edema.  No carotid bruit.   Abdomen: Soft, nontender, no distention.  Neurologic: Alert and oriented.  Psych: Normal affect. Skin: Normal. Musculoskeletal: Normal range of motion, no gross deformities. Extremities: Bilateral finger clubbing. No cyanosis.   ECG: Most recent ECG reviewed.      ASSESSMENT AND PLAN: Chronic systolic congestive heart failure  Continue Lasix 40 mg daily . Continue spironolactone 25 mg daily. She is euvolemic and compensated today. Will need to continue to follow renal function and potassium closely. GFR 45 ml/min on 11/29/13. Will try and obtain from PCP.  Ischemic cardiomyopathy  Quiescent on current medication regimen. Echo on 03/30/14 showed EF 30%. Stress test in 2012 showed only myocardial scar, without ischemia. She is taking ASA 81 mg daily.   Ventricular tachycardia  Status post CRT-D upgrade on 11/16/13. Device interrogation today demonstrated normal device function with no mode switches nor ventricular arrhythmias, with >94% bi-V pacing. Followed by Dr. Johney Frame.   Essential hypertension  Controlled on present therapy. No changes.  Hyperlipidemia  Followed by primary M.D. Will request most recent lab results.   CKD stage 3 GFR 45 ml/min on 11/29/13. Will try and obtain most recent BMET from PCP.  Dispo: f/u 6 months.     Prentice Docker, M.D., F.A.C.C.

## 2014-08-24 ENCOUNTER — Encounter: Payer: Self-pay | Admitting: *Deleted

## 2014-08-24 NOTE — Progress Notes (Signed)
EPIC Encounter for ICM Monitoring  Patient Name: Tracy Mills is a 69 y.o. female Date: 08/24/2014 Primary Care Physican: Ignatius Specking., MD Primary Cardiologist: Purvis Sheffield Electrophysiologist: Allred Dry Weight: 135 lbs       In the past month, have you:  1. Gained more than 2 pounds in a day or more than 5 pounds in a week? no  2. Had changes in your medications (with verification of current medications)? no  3. Had more shortness of breath than is usual for you? no  4. Limited your activity because of shortness of breath? no  5. Not been able to sleep because of shortness of breath? no  6. Had increased swelling in your feet or ankles? no  7. Had symptoms of dehydration (dizziness, dry mouth, increased thirst, decreased urine output) no  8. Had changes in sodium restriction? no  9. Been compliant with medication? Yes   ICM trend:   Follow-up plan: ICM clinic phone appointment: 09/26/14  Copy of note sent to patient's primary care physician, primary cardiologist, and device following physician.  Sherri Rad, RN, BSN 08/24/2014 1:47 PM

## 2014-08-24 NOTE — Addendum Note (Signed)
Addended by: Sherri Rad C on: 08/24/2014 01:49 PM   Modules accepted: Level of Service

## 2014-08-30 ENCOUNTER — Encounter: Payer: Self-pay | Admitting: *Deleted

## 2014-09-01 ENCOUNTER — Encounter: Payer: Self-pay | Admitting: Cardiology

## 2014-09-05 ENCOUNTER — Encounter: Payer: Self-pay | Admitting: Internal Medicine

## 2014-09-26 ENCOUNTER — Encounter: Payer: Self-pay | Admitting: *Deleted

## 2014-09-26 ENCOUNTER — Ambulatory Visit (INDEPENDENT_AMBULATORY_CARE_PROVIDER_SITE_OTHER): Payer: Medicare HMO | Admitting: *Deleted

## 2014-09-26 DIAGNOSIS — I5022 Chronic systolic (congestive) heart failure: Secondary | ICD-10-CM | POA: Diagnosis not present

## 2014-09-26 DIAGNOSIS — Z95810 Presence of automatic (implantable) cardiac defibrillator: Secondary | ICD-10-CM

## 2014-09-26 NOTE — Progress Notes (Signed)
EPIC Encounter for ICM Monitoring  Patient Name: Tracy Mills is a 69 y.o. female Date: 09/26/2014 Primary Care Physican: Ignatius Specking., MD Primary Cardiologist: Purvis Sheffield Electrophysiologist: Allred Dry Weight: 135 lbs       In the past month, have you:  1. Gained more than 2 pounds in a day or more than 5 pounds in a week? no  2. Had changes in your medications (with verification of current medications)? On Zyrtec for allergies.  3. Had more shortness of breath than is usual for you? No. She has had some sinus drainage due to allergies.  4. Limited your activity because of shortness of breath? no  5. Not been able to sleep because of shortness of breath? no  6. Had increased swelling in your feet or ankles? no  7. Had symptoms of dehydration (dizziness, dry mouth, increased thirst, decreased urine output) no  8. Had changes in sodium restriction? no  9. Been compliant with medication? Yes   ICM trend:   Follow-up plan: ICM clinic phone appointment: 11/21/14. The patient is doing well today. No changes made. Device clinic already had her next remote scheduled for 11/21/14- since the patient's optivols have been stable, will wait to recheck until then.  Copy of note sent to patient's primary care physician, primary cardiologist, and device following physician.  Sherri Rad, RN, BSN 09/26/2014 12:24 PM

## 2014-11-21 ENCOUNTER — Telehealth: Payer: Self-pay | Admitting: Cardiology

## 2014-11-21 ENCOUNTER — Ambulatory Visit (INDEPENDENT_AMBULATORY_CARE_PROVIDER_SITE_OTHER): Payer: Medicare HMO | Admitting: *Deleted

## 2014-11-21 DIAGNOSIS — I5022 Chronic systolic (congestive) heart failure: Secondary | ICD-10-CM | POA: Diagnosis not present

## 2014-11-21 DIAGNOSIS — Z95810 Presence of automatic (implantable) cardiac defibrillator: Secondary | ICD-10-CM | POA: Diagnosis not present

## 2014-11-21 DIAGNOSIS — I255 Ischemic cardiomyopathy: Secondary | ICD-10-CM

## 2014-11-21 LAB — CUP PACEART REMOTE DEVICE CHECK
Battery Remaining Longevity: 91 mo
Brady Statistic AP VS Percent: 0.73 %
Brady Statistic AS VP Percent: 59.36 %
Brady Statistic AS VS Percent: 1.22 %
Brady Statistic RA Percent Paced: 39.43 %
Date Time Interrogation Session: 20160629161353
HIGH POWER IMPEDANCE MEASURED VALUE: 53 Ohm
HighPow Impedance: 45 Ohm
Lead Channel Impedance Value: 399 Ohm
Lead Channel Impedance Value: 399 Ohm
Lead Channel Impedance Value: 399 Ohm
Lead Channel Impedance Value: 456 Ohm
Lead Channel Impedance Value: 475 Ohm
Lead Channel Impedance Value: 532 Ohm
Lead Channel Impedance Value: 646 Ohm
Lead Channel Impedance Value: 817 Ohm
Lead Channel Pacing Threshold Amplitude: 0.5 V
Lead Channel Pacing Threshold Amplitude: 0.75 V
Lead Channel Pacing Threshold Amplitude: 1.375 V
Lead Channel Pacing Threshold Pulse Width: 0.4 ms
Lead Channel Pacing Threshold Pulse Width: 0.4 ms
Lead Channel Sensing Intrinsic Amplitude: 1.125 mV
Lead Channel Sensing Intrinsic Amplitude: 31.625 mV
Lead Channel Sensing Intrinsic Amplitude: 31.625 mV
Lead Channel Setting Pacing Amplitude: 2.5 V
Lead Channel Setting Pacing Amplitude: 2.5 V
Lead Channel Setting Pacing Pulse Width: 0.4 ms
MDC IDC MSMT BATTERY VOLTAGE: 2.98 V
MDC IDC MSMT LEADCHNL LV IMPEDANCE VALUE: 418 Ohm
MDC IDC MSMT LEADCHNL LV IMPEDANCE VALUE: 646 Ohm
MDC IDC MSMT LEADCHNL LV IMPEDANCE VALUE: 836 Ohm
MDC IDC MSMT LEADCHNL LV IMPEDANCE VALUE: 836 Ohm
MDC IDC MSMT LEADCHNL RA PACING THRESHOLD PULSEWIDTH: 0.4 ms
MDC IDC MSMT LEADCHNL RA SENSING INTR AMPL: 1.125 mV
MDC IDC MSMT LEADCHNL RV IMPEDANCE VALUE: 513 Ohm
MDC IDC SET LEADCHNL LV PACING PULSEWIDTH: 0.4 ms
MDC IDC SET LEADCHNL RA PACING AMPLITUDE: 2 V
MDC IDC SET LEADCHNL RV SENSING SENSITIVITY: 0.3 mV
MDC IDC SET ZONE DETECTION INTERVAL: 300 ms
MDC IDC SET ZONE DETECTION INTERVAL: 350 ms
MDC IDC SET ZONE DETECTION INTERVAL: 400 ms
MDC IDC SET ZONE DETECTION INTERVAL: 400 ms
MDC IDC STAT BRADY AP VP PERCENT: 38.7 %
MDC IDC STAT BRADY RV PERCENT PACED: 26.02 %

## 2014-11-21 NOTE — Telephone Encounter (Signed)
LMOVM reminding pt to send remote transmission.   

## 2014-11-22 ENCOUNTER — Encounter: Payer: Self-pay | Admitting: Cardiology

## 2014-11-23 ENCOUNTER — Encounter: Payer: Self-pay | Admitting: Internal Medicine

## 2014-11-23 DIAGNOSIS — I255 Ischemic cardiomyopathy: Secondary | ICD-10-CM

## 2014-11-23 NOTE — Progress Notes (Signed)
Remote ICD transmission.   

## 2014-11-24 ENCOUNTER — Encounter: Payer: Self-pay | Admitting: *Deleted

## 2014-11-24 NOTE — Progress Notes (Signed)
EPIC Encounter for ICM Monitoring  Patient Name: Tracy Mills is a 69 y.o. female Date: 11/24/2014 Primary Care Physican: Ignatius Specking., MD Primary Cardiologist: Purvis Sheffield Electrophysiologist: Allred Dry Weight: 140 lbs      In the past month, have you:  1. Gained more than 2 pounds in a day or more than 5 pounds in a week? no  2. Had changes in your medications (with verification of current medications)? no  3. Had more shortness of breath than is usual for you? no  4. Limited your activity because of shortness of breath? no  5. Not been able to sleep because of shortness of breath? no  6. Had increased swelling in your feet or ankles? no  7. Had symptoms of dehydration (dizziness, dry mouth, increased thirst, decreased urine output) no  8. Had changes in sodium restriction? no  9. Been compliant with medication? Yes   ICM trend:   Follow-up plan: ICM clinic phone appointment: 12/26/14. No changes made today.  Copy of note sent to patient's primary care physician, primary cardiologist, and device following physician.  Sherri Rad, RN, BSN 11/24/2014 12:36 PM

## 2014-11-24 NOTE — Addendum Note (Signed)
Addended by: Sherri Rad C on: 11/24/2014 12:40 PM   Modules accepted: Level of Service

## 2014-11-27 LAB — CUP PACEART REMOTE DEVICE CHECK
Battery Voltage: 2.98 V
Brady Statistic AP VS Percent: 0.73 %
Brady Statistic AS VS Percent: 1.22 %
Date Time Interrogation Session: 20160629161353
HighPow Impedance: 45 Ohm
HighPow Impedance: 53 Ohm
Lead Channel Impedance Value: 399 Ohm
Lead Channel Impedance Value: 399 Ohm
Lead Channel Impedance Value: 399 Ohm
Lead Channel Impedance Value: 418 Ohm
Lead Channel Impedance Value: 475 Ohm
Lead Channel Impedance Value: 513 Ohm
Lead Channel Impedance Value: 532 Ohm
Lead Channel Impedance Value: 646 Ohm
Lead Channel Impedance Value: 836 Ohm
Lead Channel Pacing Threshold Amplitude: 0.5 V
Lead Channel Pacing Threshold Amplitude: 0.75 V
Lead Channel Pacing Threshold Amplitude: 1.375 V
Lead Channel Pacing Threshold Pulse Width: 0.4 ms
Lead Channel Pacing Threshold Pulse Width: 0.4 ms
Lead Channel Sensing Intrinsic Amplitude: 1.125 mV
Lead Channel Sensing Intrinsic Amplitude: 31.625 mV
Lead Channel Sensing Intrinsic Amplitude: 31.625 mV
Lead Channel Setting Pacing Amplitude: 2 V
Lead Channel Setting Pacing Pulse Width: 0.4 ms
Lead Channel Setting Pacing Pulse Width: 0.4 ms
MDC IDC MSMT BATTERY REMAINING LONGEVITY: 91 mo
MDC IDC MSMT LEADCHNL LV IMPEDANCE VALUE: 646 Ohm
MDC IDC MSMT LEADCHNL LV IMPEDANCE VALUE: 817 Ohm
MDC IDC MSMT LEADCHNL LV IMPEDANCE VALUE: 836 Ohm
MDC IDC MSMT LEADCHNL RA IMPEDANCE VALUE: 456 Ohm
MDC IDC MSMT LEADCHNL RA PACING THRESHOLD PULSEWIDTH: 0.4 ms
MDC IDC MSMT LEADCHNL RA SENSING INTR AMPL: 1.125 mV
MDC IDC SET LEADCHNL LV PACING AMPLITUDE: 2.5 V
MDC IDC SET LEADCHNL RV PACING AMPLITUDE: 2.5 V
MDC IDC SET LEADCHNL RV SENSING SENSITIVITY: 0.3 mV
MDC IDC SET ZONE DETECTION INTERVAL: 400 ms
MDC IDC STAT BRADY AP VP PERCENT: 38.7 %
MDC IDC STAT BRADY AS VP PERCENT: 59.36 %
MDC IDC STAT BRADY RA PERCENT PACED: 39.43 %
MDC IDC STAT BRADY RV PERCENT PACED: 26.02 %
Zone Setting Detection Interval: 300 ms
Zone Setting Detection Interval: 350 ms
Zone Setting Detection Interval: 400 ms

## 2014-12-07 ENCOUNTER — Encounter: Payer: Self-pay | Admitting: Cardiology

## 2014-12-26 ENCOUNTER — Ambulatory Visit (INDEPENDENT_AMBULATORY_CARE_PROVIDER_SITE_OTHER): Payer: Medicare HMO | Admitting: *Deleted

## 2014-12-26 DIAGNOSIS — Z95810 Presence of automatic (implantable) cardiac defibrillator: Secondary | ICD-10-CM | POA: Diagnosis not present

## 2014-12-26 DIAGNOSIS — I5022 Chronic systolic (congestive) heart failure: Secondary | ICD-10-CM

## 2014-12-29 ENCOUNTER — Encounter: Payer: Self-pay | Admitting: *Deleted

## 2014-12-29 NOTE — Progress Notes (Signed)
EPIC Encounter for ICM Monitoring  Patient Name: Tracy Mills is a 69 y.o. female Date: 12/29/2014 Primary Care Physican: Ignatius Specking., MD Primary Cardiologist: Purvis Sheffield Electrophysiologist: Allred Dry Weight: 140 lbs       In the past month, have you:  1. Gained more than 2 pounds in a day or more than 5 pounds in a week? no  2. Had changes in your medications (with verification of current medications)? no  3. Had more shortness of breath than is usual for you? no  4. Limited your activity because of shortness of breath? no  5. Not been able to sleep because of shortness of breath? no  6. Had increased swelling in your feet or ankles? no  7. Had symptoms of dehydration (dizziness, dry mouth, increased thirst, decreased urine output) no  8. Had changes in sodium restriction? no  9. Been compliant with medication? Yes   ICM trend:   Follow-up plan: ICM clinic phone appointment: 02/01/15. No changes made today.  Copy of note sent to patient's primary care physician, primary cardiologist, and device following physician.  Sherri Rad, RN, BSN 12/29/2014 1:59 PM

## 2015-01-26 ENCOUNTER — Other Ambulatory Visit: Payer: Self-pay | Admitting: Cardiovascular Disease

## 2015-01-26 MED ORDER — SPIRONOLACTONE 25 MG PO TABS: 25.0000 mg | ORAL_TABLET | Freq: Every day | ORAL | Status: DC

## 2015-01-26 MED ORDER — FUROSEMIDE 40 MG PO TABS: 40.0000 mg | ORAL_TABLET | Freq: Every day | ORAL | Status: DC

## 2015-01-26 MED ORDER — LOSARTAN POTASSIUM 50 MG PO TABS: 50.0000 mg | ORAL_TABLET | Freq: Every day | ORAL | Status: DC

## 2015-01-26 MED ORDER — CARVEDILOL 3.125 MG PO TABS: 3.1250 mg | ORAL_TABLET | Freq: Two times a day (BID) | ORAL | Status: DC

## 2015-01-26 NOTE — Telephone Encounter (Signed)
Tracy Mills called stating that she is changing her pharmacy to [facility]. Needs her prescriptions faxed to [place], [place] location.

## 2015-01-26 NOTE — Telephone Encounter (Signed)
Refill complete 

## 2015-02-01 ENCOUNTER — Ambulatory Visit (INDEPENDENT_AMBULATORY_CARE_PROVIDER_SITE_OTHER): Payer: Medicare HMO

## 2015-02-01 DIAGNOSIS — Z95810 Presence of automatic (implantable) cardiac defibrillator: Secondary | ICD-10-CM | POA: Diagnosis not present

## 2015-02-01 DIAGNOSIS — I5022 Chronic systolic (congestive) heart failure: Secondary | ICD-10-CM | POA: Diagnosis not present

## 2015-02-01 NOTE — Progress Notes (Signed)
EPIC Encounter for ICM Monitoring  Patient Name: Tracy Mills is a 69 y.o. female Date: 02/01/2015 Primary Care Physican: Ignatius Specking., MD Primary Cardiologist: Purvis Sheffield Electrophysiologist: Allred Dry Weight: unknown        In the past month, have you:  1. Gained more than 2 pounds in a day or more than 5 pounds in a week? Unknown, patient weighs about once a week.  Education given to weigh daily to monitor for fluid retention.  Advised to call office for 2-3 pound weight gain overnight or 5 pounds within a week.  2. Had changes in your medications (with verification of current medications)? no  3. Had more shortness of breath than is usual for you? no  4. Limited your activity because of shortness of breath? no  5. Not been able to sleep because of shortness of breath? no  6. Had increased swelling in your feet or ankles? no  7. Had symptoms of dehydration (dizziness, dry mouth, increased thirst, decreased urine output) no  8. Had changes in sodium restriction? no  9. Been compliant with medication? Yes   ICM trend:   Follow-up plan: ICM clinic phone appointment 02/07/2015 for repeat transmission.  Optivol impedance trending significantly below baseline 01/16/2015 to 02/01/2015 (date of transmission).  She reported she was out of her diurectic meds for 3-4 days and just had it filled today.  She denied any breathing difficulty or leg/feet/ankle swelling.              Instructed her to take Lasix 40 mg bid x 2 days and then resume her regular prescribed dosage of 40 mg daily.  Requested she send another Optivol transmission in a week.          Explained I will forward the information to the physician for any further recommendations.   Copy of note sent to patient's primary care physician, primary cardiologist, and device following physician.  [name], RN, CCM 02/01/2015 4:36 PM

## 2015-02-07 ENCOUNTER — Ambulatory Visit (INDEPENDENT_AMBULATORY_CARE_PROVIDER_SITE_OTHER): Payer: Medicare HMO

## 2015-02-07 DIAGNOSIS — I5022 Chronic systolic (congestive) heart failure: Secondary | ICD-10-CM

## 2015-02-07 DIAGNOSIS — Z95810 Presence of automatic (implantable) cardiac defibrillator: Secondary | ICD-10-CM

## 2015-02-08 NOTE — Progress Notes (Addendum)
EPIC Encounter for ICM Monitoring  Patient Name: Tracy Mills is a 69 y.o. female Date: 02/08/2015 Primary Care Physican: Ignatius Specking., MD Primary Cardiologist: Purvis Sheffield  Electrophysiologist: Allred Dry Weight: unknown       In the past month, have you:  1. Gained more than 2 pounds in a day or more than 5 pounds in a week? unknown  2. Had changes in your medications (with verification of current medications)? no  3. Had more shortness of breath than is usual for you? no  4. Limited your activity because of shortness of breath? no  5. Not been able to sleep because of shortness of breath? no  6. Had increased swelling in your feet or ankles? no  7. Had symptoms of dehydration (dizziness, dry mouth, increased thirst, decreased urine output) no  8. Had changes in sodium restriction? no  9. Been compliant with medication? Yes   ICM trend:   Follow-up plan: ICM clinic phone appointment 03/15/2015.  Optivol repeat transmission trending back to baseline.  Patient stated she has the Lasix and taking as prescribed.  Stressed the importance of taking meds as prescribed and not missing any dosages.  She denied any HF symptoms at this time.  She does not weigh at home.  No changes today.  She has appointment with Dr Purvis Sheffield in October.  Copy of note sent to patient's primary care physician, primary cardiologist, and device following physician.  [name], RN, CCM 02/08/2015 2:22 PM

## 2015-03-07 ENCOUNTER — Encounter: Payer: Self-pay | Admitting: *Deleted

## 2015-03-07 ENCOUNTER — Encounter: Payer: Self-pay | Admitting: Cardiovascular Disease

## 2015-03-07 ENCOUNTER — Ambulatory Visit (INDEPENDENT_AMBULATORY_CARE_PROVIDER_SITE_OTHER): Payer: Medicare HMO | Admitting: Cardiovascular Disease

## 2015-03-07 VITALS — BP 142/82 | HR 74 | Ht 63.0 in | Wt 132.0 lb

## 2015-03-07 DIAGNOSIS — Z95810 Presence of automatic (implantable) cardiac defibrillator: Secondary | ICD-10-CM | POA: Diagnosis not present

## 2015-03-07 DIAGNOSIS — I5022 Chronic systolic (congestive) heart failure: Secondary | ICD-10-CM

## 2015-03-07 DIAGNOSIS — I10 Essential (primary) hypertension: Secondary | ICD-10-CM

## 2015-03-07 DIAGNOSIS — I472 Ventricular tachycardia, unspecified: Secondary | ICD-10-CM

## 2015-03-07 DIAGNOSIS — I2510 Atherosclerotic heart disease of native coronary artery without angina pectoris: Secondary | ICD-10-CM

## 2015-03-07 DIAGNOSIS — I519 Heart disease, unspecified: Secondary | ICD-10-CM | POA: Diagnosis not present

## 2015-03-07 DIAGNOSIS — E785 Hyperlipidemia, unspecified: Secondary | ICD-10-CM

## 2015-03-07 MED ORDER — FUROSEMIDE 40 MG PO TABS: 40.0000 mg | ORAL_TABLET | Freq: Every day | ORAL | Status: DC

## 2015-03-07 MED ORDER — NITROGLYCERIN 0.4 MG SL SUBL: 0.4000 mg | SUBLINGUAL_TABLET | SUBLINGUAL | Status: DC | PRN

## 2015-03-07 NOTE — Progress Notes (Signed)
Patient ID: BETSAIDA MISSOURI, female   DOB: May 01, 1946, 69 y.o.   MRN: [id number]      SUBJECTIVE: The patient is here to followup for CAD and ischemic cardiomyopathy with chronic systolic heart failure. She underwent CRT-D upgrade on 11/16/13 by Dr. Johney Frame.  Weight is 132 pounds today (134 lbs in 07/2014 at last visit with me).   She is feeling well and denies chest pain, shortness of breath, palpitations, orthopnea, PND, abdominal and leg swelling.  Device interrogation today demonstrated normal device function with no mode switches nor ventricular arrhythmias, with >94.9% bi-V pacing on 11/21/14.    Review of Systems: As per "subjective", otherwise negative.  Allergies  Allergen Reactions  . Penicillins Hives  . Tetracyclines & Related Other (See Comments)    "yeast infections"    Current Outpatient Prescriptions  Medication Sig Dispense Refill  . acetaminophen (TYLENOL) 500 MG tablet Take 500 mg by mouth daily.    Marland Kitchen aspirin 81 MG tablet Take 81 mg by mouth daily.      . carvedilol (COREG) 3.125 MG tablet Take 1 tablet (3.125 mg total) by mouth 2 (two) times daily. 180 tablet 3  . furosemide (LASIX) 40 MG tablet Take 1 tablet (40 mg total) by mouth daily. 30 tablet 3  . losartan (COZAAR) 50 MG tablet Take 1 tablet (50 mg total) by mouth daily. 30 tablet 3  . nitroGLYCERIN (NITROSTAT) 0.4 MG SL tablet Place 1 tablet (0.4 mg total) under the tongue every 5 (five) minutes as needed for chest pain. 25 tablet 3  . simvastatin (ZOCOR) 40 MG tablet Take 1 tablet (40 mg total) by mouth daily. 90 tablet 3  . spironolactone (ALDACTONE) 25 MG tablet Take 1 tablet (25 mg total) by mouth daily. 90 tablet 3   No current facility-administered medications for this visit.    Past Medical History  Diagnosis Date  . Chronic systolic congestive heart failure (HCC)   . Hypertension   . Tobacco abuse   . Coronary artery disease     anterior MI in 2010. LAD stent placement 2010. EF: 15%  . MR  (mitral regurgitation)     moderate to severe  . LBBB (left bundle branch block)   . Ischemic cardiomyopathy   . VF (ventricular fibrillation) (HCC) 2013    nonsustained VF, spontaneously termianted    Past Surgical History  Procedure Laterality Date  . Cardiac defibrillator placement  06/14/09    MDT [facility] Dr Johney Frame  . Tonsillectomy and adenoidectomy    . Tubal ligation    . Cardiac catheterization  2010    LAD PCI and stent placement  . Bi-ventricular implantable cardioverter defibrillator upgrade  11-16-2013    upgrade of previously implanted dual chamber ICD to MDT Viva Quad XT CRTD by Dr Johney Frame  . Bi-ventricular implantable cardioverter defibrillator upgrade N/A 11/16/2013    Procedure: BI-VENTRICULAR IMPLANTABLE CARDIOVERTER DEFIBRILLATOR UPGRADE;  Surgeon: Gardiner Rhyme, MD;  Location: [facility] CATH LAB;  Service: Cardiovascular;  Laterality: N/A;    Social History   Social History  . Marital Status: Single    Spouse Name: N/A  . Number of Children: N/A  . Years of Education: N/A   Occupational History  . RETIRED     retired bus Office manager office   Social History Main Topics  . Smoking status: Former Smoker -- 0.20 packs/day for 45 years    Types: Cigarettes    Start date: 04/07/1966  . Smokeless tobacco: Never Used  Comment: smokes socially/ smokes 2 cigarettes per month, trying to quit- quit 6 months ago 03/07/15  . Alcohol Use: 0.0 oz/week    0 Standard drinks or equivalent per week     Comment: occasional alcohol use  . Drug Use: No  . Sexual Activity: No   Other Topics Concern  . Not on file   Social History Narrative   Moved to [place] from [place].  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 Neck: No JVD, no thyromegaly. Lungs: Clear to  auscultation bilaterally with normal respiratory effort. CV: Nondisplaced PMI.  Regular rate and rhythm, normal S1/S2, no S3/S4, no murmur. No pretibial or periankle edema.  No carotid bruit.  Normal pedal pulses.  Abdomen: Soft, nontender, no hepatosplenomegaly, no distention.  Neurologic: Alert and oriented x 3.  Psych: Normal affect. Skin: Normal. Musculoskeletal: Normal range of motion, no gross deformities. Extremities: No clubbing or cyanosis.   ECG: Most recent ECG reviewed.      ASSESSMENT AND PLAN:  Chronic systolic congestive heart failure  Continue Lasix 40 mg daily . Continue spironolactone 25 mg daily. She is euvolemic and compensated today. Will need to continue to follow renal function and potassium closely. GFR 45 ml/min on 11/29/13. Will try and obtain from PCP.  Ischemic cardiomyopathy  Quiescent on current medication regimen. Echo on 03/30/14 showed EF 30%. Stress test in 2012 showed only myocardial scar, without ischemia. She is taking ASA 81 mg daily along with Coreg, losartan, and simvastatin.   Ventricular tachycardia  Status post CRT-D upgrade on 11/16/13. Device interrogation demonstrated normal device function with no mode switches nor ventricular arrhythmias, with >94.9% bi-V pacing on 11/21/14.  Followed by Dr. Johney Frame.   Essential hypertension  Mildly elevated on present therapy. Will monitor. No changes to therapy today.  Hyperlipidemia  Followed by primary M.D. Will request most recent lab results.   CKD stage 3 GFR 45 ml/min on 11/29/13. Will try and obtain most recent BMET from PCP.  Dispo: f/u May 2017.  Prentice Docker, M.D., F.A.C.C.

## 2015-03-07 NOTE — Patient Instructions (Signed)
Continue all current medications. Your physician wants you to follow up in: 7 months.  You will receive a reminder letter in the mail one-two months in advance.  If you don't receive a letter, please call our office to schedule the follow up appointment   

## 2015-03-15 ENCOUNTER — Telehealth: Payer: Self-pay | Admitting: Cardiology

## 2015-03-15 NOTE — Telephone Encounter (Signed)
Spoke with pt and reminded pt of remote transmission that is due today. Pt verbalized understanding.   

## 2015-03-16 ENCOUNTER — Telehealth: Payer: Self-pay

## 2015-03-16 NOTE — Telephone Encounter (Signed)
Unable to reach patient for ICM follow up.  Patient has in office appointment with Dr Johney Frame on 03/31/2015 and next ICM remote transmission will be 05/01/2015.

## 2015-03-16 NOTE — Telephone Encounter (Signed)
ICM transmission received 03/16/2015.  Attempted patient call and no answer.  Unable to leave voice mail.

## 2015-03-16 NOTE — Telephone Encounter (Signed)
Attempted patient call for ICM follow and no answer. Unable to leave voice mail.

## 2015-03-31 ENCOUNTER — Ambulatory Visit (INDEPENDENT_AMBULATORY_CARE_PROVIDER_SITE_OTHER): Payer: Medicare HMO | Admitting: Internal Medicine

## 2015-03-31 ENCOUNTER — Encounter: Payer: Self-pay | Admitting: Internal Medicine

## 2015-03-31 VITALS — BP 122/80 | HR 85 | Ht 63.0 in | Wt 131.0 lb

## 2015-03-31 DIAGNOSIS — I255 Ischemic cardiomyopathy: Secondary | ICD-10-CM | POA: Diagnosis not present

## 2015-03-31 DIAGNOSIS — I472 Ventricular tachycardia, unspecified: Secondary | ICD-10-CM

## 2015-03-31 DIAGNOSIS — I5022 Chronic systolic (congestive) heart failure: Secondary | ICD-10-CM | POA: Diagnosis not present

## 2015-03-31 LAB — CUP PACEART INCLINIC DEVICE CHECK
Battery Voltage: 2.99 V
Brady Statistic AP VP Percent: 39.43 %
Brady Statistic AS VP Percent: 58.61 %
Brady Statistic AS VS Percent: 1.25 %
Date Time Interrogation Session: 20161104144510
HighPow Impedance: 44 Ohm
HighPow Impedance: 54 Ohm
Implantable Lead Implant Date: 20110119
Implantable Lead Model: 5076
Implantable Lead Model: 6947
Lead Channel Impedance Value: 456 Ohm
Lead Channel Impedance Value: 456 Ohm
Lead Channel Impedance Value: 475 Ohm
Lead Channel Impedance Value: 513 Ohm
Lead Channel Impedance Value: 589 Ohm
Lead Channel Impedance Value: 779 Ohm
Lead Channel Impedance Value: 893 Ohm
Lead Channel Impedance Value: 988 Ohm
Lead Channel Pacing Threshold Amplitude: 0.5 V
Lead Channel Pacing Threshold Amplitude: 1.5 V
Lead Channel Pacing Threshold Pulse Width: 0.4 ms
Lead Channel Pacing Threshold Pulse Width: 0.4 ms
Lead Channel Sensing Intrinsic Amplitude: 3.125 mV
Lead Channel Setting Pacing Amplitude: 2.5 V
Lead Channel Setting Sensing Sensitivity: 0.3 mV
MDC IDC LEAD IMPLANT DT: 20110119
MDC IDC LEAD LOCATION: 753859
MDC IDC LEAD LOCATION: 753860
MDC IDC MSMT BATTERY REMAINING LONGEVITY: 87 mo
MDC IDC MSMT LEADCHNL LV IMPEDANCE VALUE: 589 Ohm
MDC IDC MSMT LEADCHNL LV IMPEDANCE VALUE: 779 Ohm
MDC IDC MSMT LEADCHNL LV IMPEDANCE VALUE: 779 Ohm
MDC IDC MSMT LEADCHNL RA PACING THRESHOLD PULSEWIDTH: 0.4 ms
MDC IDC MSMT LEADCHNL RV IMPEDANCE VALUE: 418 Ohm
MDC IDC MSMT LEADCHNL RV IMPEDANCE VALUE: 513 Ohm
MDC IDC MSMT LEADCHNL RV PACING THRESHOLD AMPLITUDE: 0.75 V
MDC IDC MSMT LEADCHNL RV SENSING INTR AMPL: 31.625 mV
MDC IDC SET LEADCHNL LV PACING PULSEWIDTH: 0.4 ms
MDC IDC SET LEADCHNL RA PACING AMPLITUDE: 2 V
MDC IDC STAT BRADY AP VS PERCENT: 0.71 %
MDC IDC STAT BRADY RA PERCENT PACED: 40.13 %
MDC IDC STAT BRADY RV PERCENT PACED: 21.93 %

## 2015-03-31 NOTE — Patient Instructions (Signed)
Your physician recommends that you continue on your current medications as directed. Please refer to the Current Medication list given to you today. Remote device check on 07/03/15. Your physician recommends that you schedule a follow-up appointment in: 1 year. You can schedule this appointment today or you can wait for your letter to come in the mail in about 10 months reminding you to call and schedule this appointment. If you do not receive this letter, please contact our office for your appointment.  

## 2015-03-31 NOTE — Progress Notes (Signed)
PCP: Ignatius Specking., MD Primary Cardiologist:  Tracy Mills is a 69 y.o. female who presents today for routine electrophysiology followup.  She is doing well.  Occasional SOB.   Today, she denies symptoms of palpitations, chest pain,  lower extremity edema, dizziness, presyncope, syncope, or ICD shocks.  The patient is otherwise without complaint today.   Past Medical History  Diagnosis Date  . Chronic systolic congestive heart failure (HCC)   . Hypertension   . Tobacco abuse   . Coronary artery disease     anterior MI in 2010. LAD stent placement 2010. EF: 15%  . MR (mitral regurgitation)     moderate to severe  . LBBB (left bundle branch block)   . Ischemic cardiomyopathy   . VF (ventricular fibrillation) (HCC) 2013    nonsustained VF, spontaneously termianted   Past Surgical History  Procedure Laterality Date  . Cardiac defibrillator placement  06/14/09    MDT Secura Tracy Tracy Mills  . Tonsillectomy and adenoidectomy    . Tubal ligation    . Cardiac catheterization  2010    LAD PCI and stent placement  . Bi-ventricular implantable cardioverter defibrillator upgrade  11-16-2013    upgrade of previously implanted dual chamber ICD to MDT Viva Quad XT CRTD by Tracy Tracy Mills  . Bi-ventricular implantable cardioverter defibrillator upgrade N/A 11/16/2013    Procedure: BI-VENTRICULAR IMPLANTABLE CARDIOVERTER DEFIBRILLATOR UPGRADE;  Surgeon: Tracy Rhyme, MD;  Location: [facility] CATH LAB;  Service: Cardiovascular;  Laterality: N/A;    Current Outpatient Prescriptions  Medication Sig Dispense Refill  . acetaminophen (TYLENOL) 500 MG tablet Take 500 mg by mouth daily.    Marland Kitchen aspirin 81 MG tablet Take 81 mg by mouth daily.      . carvedilol (COREG) 3.125 MG tablet Take 1 tablet (3.125 mg total) by mouth 2 (two) times daily. 180 tablet 3  . furosemide (LASIX) 40 MG tablet Take 1 tablet (40 mg total) by mouth daily. 90 tablet 3  . losartan (COZAAR) 50 MG tablet Take 1 tablet (50 mg total)  by mouth daily. 30 tablet 3  . nitroGLYCERIN (NITROSTAT) 0.4 MG SL tablet Place 1 tablet (0.4 mg total) under the tongue every 5 (five) minutes as needed for chest pain. 25 tablet 3  . simvastatin (ZOCOR) 40 MG tablet Take 1 tablet (40 mg total) by mouth daily. 90 tablet 3  . spironolactone (ALDACTONE) 25 MG tablet Take 1 tablet (25 mg total) by mouth daily. 90 tablet 3   No current facility-administered medications for this visit.    Physical Exam: Filed Vitals:   03/31/15 1022  BP: 122/80  Pulse: 85  Height: 5\' 3"  (1.6 m)  Weight: 131 lb (59.421 kg)  SpO2: 98%    GEN- The patient is well appearing, alert and oriented x 3 today.   Head- normocephalic, atraumatic Eyes-  Sclera clear, conjunctiva pink Ears- hearing intact Oropharynx- clear Lungs- Clear to ausculation bilaterally, normal work of breathing Chest- ICD pocket is well healed Heart- Regular rate and rhythm, no murmurs, wide S2 split GI- soft, NT, ND, + BS Extremities- no clubbing, cyanosis, or edema  ICD interrogation- reviewed in detail today,  See PACEART report  Assessment and Plan:  1.  Chronic systolic dysfunction euvolemic today Stable on an appropriate medical regimen Normal ICD function See Pace Art report No changes today Followed in ICM device clinic   2. VT- previous NSVT Normal ICD function.  3. Tobacco Cessation is advised She is trying to  quit  [facility]  Return to see me in 1 year Tracy Tracy Mills to see as scheduled  Tracy Range MD, [facility] 03/31/2015 6:09 PM

## 2015-05-01 ENCOUNTER — Telehealth: Payer: Self-pay

## 2015-05-01 ENCOUNTER — Ambulatory Visit (INDEPENDENT_AMBULATORY_CARE_PROVIDER_SITE_OTHER): Payer: Medicare HMO

## 2015-05-01 DIAGNOSIS — I5022 Chronic systolic (congestive) heart failure: Secondary | ICD-10-CM | POA: Diagnosis not present

## 2015-05-01 DIAGNOSIS — Z95810 Presence of automatic (implantable) cardiac defibrillator: Secondary | ICD-10-CM | POA: Diagnosis not present

## 2015-05-01 NOTE — Telephone Encounter (Signed)
ICM transmission received.  Attempted call and message left for return call.

## 2015-05-02 NOTE — Telephone Encounter (Signed)
Attempted ICM call and left message.   Patient returned call and left message 05/01/2015 after hours.

## 2015-05-03 NOTE — Telephone Encounter (Signed)
Spoke with patient.

## 2015-05-03 NOTE — Progress Notes (Signed)
EPIC Encounter for ICM Monitoring  Patient Name: Tracy Mills is a 69 y.o. female Date: 05/03/2015 Primary Care Physican: Ignatius Specking., MD Primary Cardiologist: Purvis Sheffield Electrophysiologist: Allred Dry Weight: unknown   Bi-V Pacing 98%       In the past month, have you:  1. Gained more than 2 pounds in a day or more than 5 pounds in a week? Not weighing  2. Had changes in your medications (with verification of current medications)? no  3. Had more shortness of breath than is usual for you? no  4. Limited your activity because of shortness of breath? no  5. Not been able to sleep because of shortness of breath? no  6. Had increased swelling in your feet or ankles? no  7. Had symptoms of dehydration (dizziness, dry mouth, increased thirst, decreased urine output) no  8. Had changes in sodium restriction? no  9. Been compliant with medication? Yes   ICM trend: 05/02/2015   Follow-up plan: ICM clinic phone appointment on 06/05/2015.  Optivol thoracic impedance below baseline 04/08/2015 to 04/24/2015 with a couple of days returned to baseline and denied any HF symptoms.  She stated she ate salty foods during holiday time.  Impedance above the baseline 04/25/2015 suggesting dryness and encouraged her to increase fluids for a couple of days.  She stated she is doing well.  Encouraged her to follow low salt diet.   No changes today.    Copy of note sent to patient's primary care physician, primary cardiologist, and device following physician.  [name], RN, CCM 05/03/2015 9:55 AM

## 2015-06-05 ENCOUNTER — Ambulatory Visit (INDEPENDENT_AMBULATORY_CARE_PROVIDER_SITE_OTHER): Payer: Self-pay

## 2015-06-05 DIAGNOSIS — I5022 Chronic systolic (congestive) heart failure: Secondary | ICD-10-CM

## 2015-06-05 DIAGNOSIS — Z95810 Presence of automatic (implantable) cardiac defibrillator: Secondary | ICD-10-CM

## 2015-06-07 NOTE — Progress Notes (Signed)
EPIC Encounter for ICM Monitoring  Patient Name: Tracy Mills is a 70 y.o. female Date: 06/07/2015 Primary Care Physican: Ignatius Specking., MD Primary Cardiologist: Purvis Sheffield Electrophysiologist: Allred Dry Weight: Does not weigh  Bi-V Pacing 92.9%       In the past month, have you:  1. Gained more than 2 pounds in a day or more than 5 pounds in a week? no  2. Had changes in your medications (with verification of current medications)? no  3. Had more shortness of breath than is usual for you? no  4. Limited your activity because of shortness of breath? no  5. Not been able to sleep because of shortness of breath? no  6. Had increased swelling in your feet or ankles? no  7. Had symptoms of dehydration (dizziness, dry mouth, increased thirst, decreased urine output) no  8. Had changes in sodium restriction? no  9. Been compliant with medication? Yes   ICM trend: 3 month view 06/05/2015  ICM trend: 1 year view 06/05/2015   Follow-up plan: ICM clinic phone appointment on 07/11/2015.  Optivol fluid index above threshold and impedance below reference line 05/24/2015 to 05/31/2015 suggesting fluid retention.  She denied any current HF or symptoms at that time.  Impedance back to baseline 06/01/2015.  Encouraged her to call for any symptoms of HF. No changes today.   Copy of note sent to patient's primary care physician, primary cardiologist, and device following physician.  [name], RN, CCM 06/07/2015 2:46 PM

## 2015-07-07 ENCOUNTER — Other Ambulatory Visit: Payer: Self-pay | Admitting: *Deleted

## 2015-07-07 MED ORDER — SPIRONOLACTONE 25 MG PO TABS: 25.0000 mg | ORAL_TABLET | Freq: Every day | ORAL | Status: DC

## 2015-07-07 MED ORDER — FUROSEMIDE 40 MG PO TABS: 40.0000 mg | ORAL_TABLET | Freq: Every day | ORAL | Status: DC

## 2015-07-07 MED ORDER — CARVEDILOL 3.125 MG PO TABS: 3.1250 mg | ORAL_TABLET | Freq: Two times a day (BID) | ORAL | Status: DC

## 2015-07-07 MED ORDER — LOSARTAN POTASSIUM 50 MG PO TABS: 50.0000 mg | ORAL_TABLET | Freq: Every day | ORAL | Status: DC

## 2015-07-07 MED ORDER — SIMVASTATIN 40 MG PO TABS: 40.0000 mg | ORAL_TABLET | Freq: Every day | ORAL | Status: DC

## 2015-07-11 ENCOUNTER — Ambulatory Visit (INDEPENDENT_AMBULATORY_CARE_PROVIDER_SITE_OTHER): Payer: Medicare Other | Admitting: *Deleted

## 2015-07-11 DIAGNOSIS — Z95810 Presence of automatic (implantable) cardiac defibrillator: Secondary | ICD-10-CM

## 2015-07-11 DIAGNOSIS — I5022 Chronic systolic (congestive) heart failure: Secondary | ICD-10-CM

## 2015-07-11 DIAGNOSIS — I255 Ischemic cardiomyopathy: Secondary | ICD-10-CM | POA: Diagnosis not present

## 2015-07-11 NOTE — Progress Notes (Signed)
Remote ICD transmission.   

## 2015-07-12 ENCOUNTER — Encounter: Payer: Self-pay | Admitting: Cardiology

## 2015-07-12 ENCOUNTER — Telehealth: Payer: Self-pay

## 2015-07-12 LAB — CUP PACEART REMOTE DEVICE CHECK
Battery Voltage: 2.97 V
Brady Statistic AP VP Percent: 36.06 %
Brady Statistic AS VP Percent: 62.2 %
Brady Statistic AS VS Percent: 1.02 %
Date Time Interrogation Session: 20170214182533
HIGH POWER IMPEDANCE MEASURED VALUE: 37 Ohm
HIGH POWER IMPEDANCE MEASURED VALUE: 44 Ohm
Implantable Lead Implant Date: 20110119
Implantable Lead Location: 753860
Lead Channel Impedance Value: 399 Ohm
Lead Channel Impedance Value: 399 Ohm
Lead Channel Impedance Value: 418 Ohm
Lead Channel Impedance Value: 456 Ohm
Lead Channel Impedance Value: 608 Ohm
Lead Channel Impedance Value: 703 Ohm
Lead Channel Impedance Value: 760 Ohm
Lead Channel Impedance Value: 779 Ohm
Lead Channel Pacing Threshold Amplitude: 0.625 V
Lead Channel Pacing Threshold Amplitude: 1.375 V
Lead Channel Pacing Threshold Pulse Width: 0.4 ms
Lead Channel Pacing Threshold Pulse Width: 0.4 ms
Lead Channel Pacing Threshold Pulse Width: 0.4 ms
Lead Channel Sensing Intrinsic Amplitude: 1.25 mV
Lead Channel Sensing Intrinsic Amplitude: 29.25 mV
Lead Channel Setting Pacing Amplitude: 2 V
Lead Channel Setting Pacing Pulse Width: 0.4 ms
Lead Channel Setting Sensing Sensitivity: 0.3 mV
MDC IDC LEAD IMPLANT DT: 20110119
MDC IDC LEAD LOCATION: 753859
MDC IDC MSMT BATTERY REMAINING LONGEVITY: 79 mo
MDC IDC MSMT LEADCHNL LV IMPEDANCE VALUE: 304 Ohm
MDC IDC MSMT LEADCHNL LV IMPEDANCE VALUE: 513 Ohm
MDC IDC MSMT LEADCHNL LV IMPEDANCE VALUE: 513 Ohm
MDC IDC MSMT LEADCHNL LV IMPEDANCE VALUE: 608 Ohm
MDC IDC MSMT LEADCHNL RA SENSING INTR AMPL: 1.25 mV
MDC IDC MSMT LEADCHNL RV IMPEDANCE VALUE: 342 Ohm
MDC IDC MSMT LEADCHNL RV PACING THRESHOLD AMPLITUDE: 1 V
MDC IDC MSMT LEADCHNL RV SENSING INTR AMPL: 29.25 mV
MDC IDC SET LEADCHNL LV PACING AMPLITUDE: 2.5 V
MDC IDC STAT BRADY AP VS PERCENT: 0.72 %
MDC IDC STAT BRADY RA PERCENT PACED: 36.78 %
MDC IDC STAT BRADY RV PERCENT PACED: 16.57 %

## 2015-07-12 MED ORDER — FUROSEMIDE 40 MG PO TABS: 40.0000 mg | ORAL_TABLET | Freq: Every day | ORAL | Status: DC

## 2015-07-12 NOTE — Progress Notes (Addendum)
EPIC Encounter for ICM Monitoring  Patient Name: Tracy Mills is a 70 y.o. female Date: 07/12/2015 Primary Care Physican: Ignatius Specking., MD Primary Cardiologist: Purvis Sheffield Electrophysiologist: Allred Dry Weight: Does not weigh  Bi-V Pacing 97.4%       In the past month, have you:  1. Gained more than 2 pounds in a day or more than 5 pounds in a week? no  2. Had changes in your medications (with verification of current medications)? no  3. Had more shortness of breath than is usual for you? no  4. Limited your activity because of shortness of breath? no  5. Not been able to sleep because of shortness of breath? no  6. Had increased swelling in your feet or ankles? no  7. Had symptoms of dehydration (dizziness, dry mouth, increased thirst, decreased urine output) no  8. Had changes in sodium restriction? Not following low salt diet.   9. Been compliant with medication? Yes   ICM trend: 3 month view for 07/11/2015   ICM trend: 1 year view for 07/11/2015   Follow-up plan: ICM clinic phone appointment 07/18/2015.  Optivol thoracic impedance below reference line 06/06/2015 to 06/30/2015 and 07/05/2015 to 07/11/2015 suggesting fluid retention.    SX: Denied any symptoms DIET:  Not compliant with low salt diet MEDS:  Currently taking Furosemide 40 mg daily LABS:  Called Dr Bonnita Levan office for latest CMP which was completed on 10/13/2014.  Potassium 4.4, Creatinine 1.38, BUN 26  Advised would send to Dr Purvis Sheffield and Dr Johney Frame for review and if would like to make any recommendations regarding her medication at this time.  I will call her back if any recommendations and repeat ICM transmission on 07/18/2015.    Copy of note sent to patient's primary care physician, primary cardiologist, and device following physician.  [name], RN, CCM 07/12/2015 2:47 PM

## 2015-07-12 NOTE — Telephone Encounter (Signed)
Call to Dr Bonnita Levan office and requested faxed copy of last CMP.  Person answering phone stated she has one from May 2016 and would fax a copy.

## 2015-07-12 NOTE — Addendum Note (Signed)
Addended by: [name] on: 07/12/2015 03:03 PM   Modules accepted: Orders

## 2015-07-13 NOTE — Progress Notes (Addendum)
Patient notified to increase Furosemide 40 mg bid x 3 days and then resume 40 mg daily.  Advised will repeat ICM transmission on 07/18/2015.   She verbalized understanding.  Encouraged to call if she has any fluid symptoms or if she has any difficulty with taking extra Furosemide dosages.    Laqueta Linden, MD  [name], RN            I think that is a reasonable plan, Jacki Cones.     Message sent to Dr Purvis Sheffield asking if patient can increase Furosemide 40 mg to bid x 3 days and then resume 40 mg daily.

## 2015-07-18 ENCOUNTER — Ambulatory Visit (INDEPENDENT_AMBULATORY_CARE_PROVIDER_SITE_OTHER): Payer: Medicare Other

## 2015-07-18 DIAGNOSIS — I5022 Chronic systolic (congestive) heart failure: Secondary | ICD-10-CM

## 2015-07-18 DIAGNOSIS — Z95810 Presence of automatic (implantable) cardiac defibrillator: Secondary | ICD-10-CM

## 2015-07-18 NOTE — Progress Notes (Signed)
EPIC Encounter for ICM Monitoring  Patient Name: Tracy Mills is a 70 y.o. female Date: 07/18/2015 Primary Care Physican: Ignatius Specking., MD Primary Cardiologist: Purvis Sheffield Electrophysiologist: Allred Dry Weight: Does not weigh  Bi-V Pacing 95%       In the past month, have you:  1. Gained more than 2 pounds in a day or more than 5 pounds in a week? unknown  2. Had changes in your medications (with verification of current medications)? no  3. Had more shortness of breath than is usual for you? no  4. Limited your activity because of shortness of breath? no  5. Not been able to sleep because of shortness of breath? no  6. Had increased swelling in your feet or ankles? no  7. Had symptoms of dehydration (dizziness, dry mouth, increased thirst, decreased urine output) no  8. Had changes in sodium restriction? no  9. Been compliant with medication? Yes   ICM trend: 3 month view for 07/18/2015   ICM trend: 1 year view for 07/18/2015   Follow-up plan: ICM clinic phone appointment on 07/25/2015.  Optivol thoracic impedance below reference line since 07/04/2015 and fluid index > threshold since ~06/16/2015 suggesting fluid accumulation.  Patient did not follow recommendation last week to increase Furosemide 40 mg bid x 3 days due to she was unsure if she should take the 2 tablets in the morning or 1 tablet twice a day.  Advised to call for clarification when needed and confirmed she has the number.  She denied any fluid symptoms at this time.     Since she did not follow recommendation last week to increase Furosemide dosage.  Recommended to start the Furosemide 40 mg twice a day x 3 days, starting today, and then return to prescribed dosage of 40 mg daily.  She verbalized understanding.  Repeat ICM transmission on 07/25/2015 and advised to limit salt intake.    Copy of note sent to patient's primary care physician, primary cardiologist, and device following physician.  [name],  RN, CCM 07/18/2015 1:28 PM

## 2015-07-25 ENCOUNTER — Ambulatory Visit (INDEPENDENT_AMBULATORY_CARE_PROVIDER_SITE_OTHER): Payer: Medicare Other

## 2015-07-25 ENCOUNTER — Telehealth: Payer: Self-pay | Admitting: Cardiology

## 2015-07-25 DIAGNOSIS — I5022 Chronic systolic (congestive) heart failure: Secondary | ICD-10-CM

## 2015-07-25 DIAGNOSIS — Z95810 Presence of automatic (implantable) cardiac defibrillator: Secondary | ICD-10-CM

## 2015-07-25 NOTE — Telephone Encounter (Signed)
Spoke with pt and reminded pt of remote transmission that is due today. Pt verbalized understanding.   

## 2015-07-26 NOTE — Progress Notes (Signed)
EPIC Encounter for ICM Monitoring  Patient Name: Tracy Mills is a 70 y.o. female Date: 07/26/2015 Primary Care Physican: Ignatius Specking., MD Primary Cardiologist: Purvis Sheffield Electrophysiologist: Allred Dry Weight: Does not weigh       In the past month, have you:  1. Gained more than 2 pounds in a day or more than 5 pounds in a week? unknown  2. Had changes in your medications (with verification of current medications)? no  3. Had more shortness of breath than is usual for you? no  4. Limited your activity because of shortness of breath? no  5. Not been able to sleep because of shortness of breath? no  6. Had increased swelling in your feet or ankles? no  7. Had symptoms of dehydration (dizziness, dry mouth, increased thirst, decreased urine output) no  8. Had changes in sodium restriction? no  9. Been compliant with medication? Yes   ICM trend: 3 month view    ICM trend: 1 year view for 07/25/2015  Follow-up plan: ICM clinic phone appointment on 08/28/2015.  Optivol thoracic impedance trending back to baseline since last transmission on 07/18/2015 and after taking extra Furosemide for 3 days.  She stated she is feeling fine today.  Reminded to stay on low salt diet and encouraged to call for any fluid symptoms.  No changes today.      Copy of note sent to patient's primary care physician, primary cardiologist, and device following physician.  [name], RN, CCM 07/26/2015 1:14 PM

## 2015-08-28 ENCOUNTER — Telehealth: Payer: Self-pay

## 2015-08-28 ENCOUNTER — Ambulatory Visit (INDEPENDENT_AMBULATORY_CARE_PROVIDER_SITE_OTHER): Payer: Medicare Other

## 2015-08-28 DIAGNOSIS — I5022 Chronic systolic (congestive) heart failure: Secondary | ICD-10-CM | POA: Diagnosis not present

## 2015-08-28 DIAGNOSIS — Z95810 Presence of automatic (implantable) cardiac defibrillator: Secondary | ICD-10-CM

## 2015-08-28 NOTE — Progress Notes (Signed)
EPIC Encounter for ICM Monitoring  Patient Name: Tracy Mills is a 70 y.o. female Date: 08/28/2015 Primary Care Physican: Ignatius Specking., MD Primary Cardiologist: Purvis Sheffield Electrophysiologist: Allred Dry Weight: Does not weigh   Bi-V Pacing 94.7%      In the past month, have you:  1. Gained more than 2 pounds in a day or more than 5 pounds in a week? no  2. Had changes in your medications (with verification of current medications)? no  3. Had more shortness of breath than is usual for you? no  4. Limited your activity because of shortness of breath? no  5. Not been able to sleep because of shortness of breath? no  6. Had increased swelling in your feet or ankles? no  7. Had symptoms of dehydration (dizziness, dry mouth, increased thirst, decreased urine output) no  8. Had changes in sodium restriction? no  9. Been compliant with medication? Yes   ICM trend: 3 month view for 08/28/2015   ICM trend: 1 year view for 08/28/2015   Follow-up plan: ICM clinic phone appointment on 09/25/2015 day before office visit with Dr Purvis Sheffield on 09/26/2015.  Thoracic impedance below reference line from 07/24/2015 to 08/21/2015 suggesting fluid accumulation and returned to reference line 08/22/2015.  Patient denied fluid symptoms.  She stated she has had a cold.  Education given to limit sodium intake to < 2000 mg and fluid intake to 64 oz daily.  Encouraged to call for any fluid symptoms.  No changes today.    [name], RN, CCM 08/28/2015 11:00 AM

## 2015-08-28 NOTE — Telephone Encounter (Signed)
Remote ICM transmission received.  Attempted patient call and left message for return call.   

## 2015-09-25 ENCOUNTER — Telehealth: Payer: Self-pay | Admitting: Cardiology

## 2015-09-25 ENCOUNTER — Ambulatory Visit: Payer: Medicare Other

## 2015-09-25 NOTE — Telephone Encounter (Signed)
Confirmed remote transmission w/ pt sister.   

## 2015-09-26 ENCOUNTER — Ambulatory Visit: Payer: Medicare Other | Admitting: Cardiovascular Disease

## 2015-09-28 ENCOUNTER — Ambulatory Visit (INDEPENDENT_AMBULATORY_CARE_PROVIDER_SITE_OTHER): Payer: Medicare Other

## 2015-09-28 DIAGNOSIS — Z95810 Presence of automatic (implantable) cardiac defibrillator: Secondary | ICD-10-CM | POA: Diagnosis not present

## 2015-09-28 DIAGNOSIS — I5022 Chronic systolic (congestive) heart failure: Secondary | ICD-10-CM | POA: Diagnosis not present

## 2015-09-28 NOTE — Progress Notes (Signed)
error 

## 2015-09-28 NOTE — Progress Notes (Signed)
EPIC Encounter for ICM Monitoring  Patient Name: Tracy Mills is a 70 y.o. female Date: 09/28/2015 Primary Care Physican: Ignatius Specking, MD Primary Cardiologist: Purvis Sheffield Electrophysiologist: Allred Dry Weight: Does not weigh   Bi-V Pacing 93.6%      In the past month, have you:  1. Gained more than 2 pounds in a day or more than 5 pounds in a week? Unknown  2. Had changes in your medications (with verification of current medications)? no  3. Had more shortness of breath than is usual for you? no  4. Limited your activity because of shortness of breath? no  5. Not been able to sleep because of shortness of breath? no  6. Had increased swelling in your feet or ankles? no  7. Had symptoms of dehydration (dizziness, dry mouth, increased thirst, decreased urine output) no  8. Had changes in sodium restriction? no  9. Been compliant with medication? Yes  ICM trend: 3 month view for 09/28/2015  ICM trend: 1 year view for 09/28/2015    Follow-up plan: ICM clinic phone appointment 10/31/2015.  Appointment with Dr Purvis Sheffield on 10/06/2015.   FLUID LEVELS: Optivol thoracic impedance decreased 08/31/2015 to 09/22/2015 suggesting fluid accumulation and returned to baseline 09/23/2015 suggesting stable fluid levels.   SYMPTOMS: No fluid symptoms.  Encouraged to call for any fluid symptoms.  EDUCATION: Limit sodium intake to < 2000 mg and fluid intake to 64 oz daily.   No changes today.    Advised will send to Dr. Purvis Sheffield for review since patient has appointment on 10/06/2015.     [name], RN, CCM 09/28/2015 9:32 AM

## 2015-10-06 ENCOUNTER — Encounter: Payer: Self-pay | Admitting: Cardiovascular Disease

## 2015-10-06 ENCOUNTER — Ambulatory Visit (INDEPENDENT_AMBULATORY_CARE_PROVIDER_SITE_OTHER): Payer: Medicare Other | Admitting: Cardiovascular Disease

## 2015-10-06 VITALS — BP 120/82 | HR 88 | Ht 63.0 in | Wt 131.0 lb

## 2015-10-06 DIAGNOSIS — I2510 Atherosclerotic heart disease of native coronary artery without angina pectoris: Secondary | ICD-10-CM

## 2015-10-06 DIAGNOSIS — I519 Heart disease, unspecified: Secondary | ICD-10-CM

## 2015-10-06 DIAGNOSIS — I5022 Chronic systolic (congestive) heart failure: Secondary | ICD-10-CM | POA: Diagnosis not present

## 2015-10-06 DIAGNOSIS — I472 Ventricular tachycardia, unspecified: Secondary | ICD-10-CM

## 2015-10-06 DIAGNOSIS — Z95810 Presence of automatic (implantable) cardiac defibrillator: Secondary | ICD-10-CM | POA: Diagnosis not present

## 2015-10-06 DIAGNOSIS — E785 Hyperlipidemia, unspecified: Secondary | ICD-10-CM

## 2015-10-06 DIAGNOSIS — I10 Essential (primary) hypertension: Secondary | ICD-10-CM

## 2015-10-06 NOTE — Patient Instructions (Signed)
Continue all current medications. Your physician wants you to follow up in:  1 year.  You will receive a reminder letter in the mail one-two months in advance.  If you don't receive a letter, please call our office to schedule the follow up appointment   

## 2015-10-06 NOTE — Progress Notes (Signed)
Patient ID: Tracy Mills, female   DOB: [birth year], 70 y.o.   MRN: [id number]      SUBJECTIVE: The patient is here to followup for CAD and ischemic cardiomyopathy with chronic systolic heart failure. She underwent CRT-D upgrade on 11/16/13 by Dr. Johney Frame.  Weight is 131 pounds today (132 lbs  03/07/2015 at last visit with me).   She is feeling well and denies chest pain, shortness of breath, palpitations, orthopnea, PND, abdominal and leg swelling. Biggest complaint is seasonal allergies.  Device interrogation 07/11/15 demonstrated normal device function.   Review of Systems: As per "subjective", otherwise negative.  Allergies  Allergen Reactions  . Penicillins Hives  . Tetracyclines & Related Other (See Comments)    "yeast infections"    Current Outpatient Prescriptions  Medication Sig Dispense Refill  . acetaminophen (TYLENOL) 500 MG tablet Take 500 mg by mouth daily.    Marland Kitchen aspirin 81 MG tablet Take 81 mg by mouth daily.      . carvedilol (COREG) 3.125 MG tablet Take 1 tablet (3.125 mg total) by mouth 2 (two) times daily. 180 tablet 3  . furosemide (LASIX) 40 MG tablet Take 1 tablet (40 mg total) by mouth daily. 90 tablet 3  . losartan (COZAAR) 50 MG tablet Take 1 tablet (50 mg total) by mouth daily. 90 tablet 3  . nitroGLYCERIN (NITROSTAT) 0.4 MG SL tablet Place 1 tablet (0.4 mg total) under the tongue every 5 (five) minutes as needed for chest pain. 25 tablet 3  . simvastatin (ZOCOR) 40 MG tablet Take 1 tablet (40 mg total) by mouth daily. 90 tablet 3  . spironolactone (ALDACTONE) 25 MG tablet Take 1 tablet (25 mg total) by mouth daily. 90 tablet 3   No current facility-administered medications for this visit.    Past Medical History  Diagnosis Date  . Chronic systolic congestive heart failure (HCC)   . Hypertension   . Tobacco abuse   . Coronary artery disease     anterior MI in 2010. LAD stent placement 2010. EF: 15%  . MR (mitral regurgitation)     moderate to  severe  . LBBB (left bundle branch block)   . Ischemic cardiomyopathy   . VF (ventricular fibrillation) (HCC) 2013    nonsustained VF, spontaneously termianted    Past Surgical History  Procedure Laterality Date  . Cardiac defibrillator placement  06/14/09    MDT [facility] Dr Johney Frame  . Tonsillectomy and adenoidectomy    . Tubal ligation    . Cardiac catheterization  2010    LAD PCI and stent placement  . Bi-ventricular implantable cardioverter defibrillator upgrade  11-16-2013    upgrade of previously implanted dual chamber ICD to MDT Viva Quad XT CRTD by Dr Johney Frame  . Bi-ventricular implantable cardioverter defibrillator upgrade N/A 11/16/2013    Procedure: BI-VENTRICULAR IMPLANTABLE CARDIOVERTER DEFIBRILLATOR UPGRADE;  Surgeon: Gardiner Rhyme, MD;  Location[facility] CATH LAB;  Service: Cardiovascular;  Laterality: N/A;    Social History   Social History  . Marital Status: Single    Spouse Name: N/A  . Number of Children: N/A  . Years of Education: N/A   Occupational History  . RETIRED     retired bus Office manager office   Social History Main Topics  . Smoking status: Former Smoker -- 0.20 packs/day for 45 years    Types: Cigarettes    Start date: 04/07/1966  . Smokeless tobacco: Never Used     Comment: smokes socially/ smokes 2 cigarettes per  month, trying to quit- quit 6 months ago 03/07/15  . Alcohol Use: 0.0 oz/week    0 Standard drinks or equivalent per week     Comment: occasional alcohol use  . Drug Use: No  . Sexual Activity: No   Other Topics Concern  . Not on file   Social History Narrative   Moved to [place] from [place].  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 Neck: No JVD, no thyromegaly. Lungs: Clear to auscultation bilaterally with normal  respiratory effort. CV: Nondisplaced PMI.  Regular rate and rhythm, normal S1/S2, no S3/S4, soft systolic murmur along lower left sternal border. No pretibial or periankle edema.  No carotid bruit.   Abdomen: Soft, nontender, no distention.  Neurologic: Alert and oriented.  Psych: Normal affect. Skin: Normal. Musculoskeletal: No gross deformities.    ECG: Most recent ECG reviewed.      ASSESSMENT AND PLAN:  Chronic systolic congestive heart failure  Euvolemic. Continue Lasix 40 mg daily and spironolactone 25 mg daily.   Ischemic cardiomyopathy  Quiescent on current medication regimen. Echo on 03/30/14 showed EF 30%. Stress test in 2012 showed only myocardial scar, without ischemia. She is taking ASA 81 mg daily along with Coreg, losartan, and simvastatin.   Ventricular tachycardia  Status post CRT-D upgrade on 11/16/13. Device interrogation demonstrated normal device function 07/11/15. Followed by Dr. Johney Frame.   Essential hypertension  Controlled. No changes to therapy today.  Hyperlipidemia  Continue statin.  Dispo: fu with Dr. Johney Frame in November 2017. Fu with me in one year.  Prentice Docker, M.D., F.A.C.C.

## 2015-10-31 ENCOUNTER — Ambulatory Visit (INDEPENDENT_AMBULATORY_CARE_PROVIDER_SITE_OTHER): Payer: Medicare Other | Admitting: *Deleted

## 2015-10-31 DIAGNOSIS — Z95810 Presence of automatic (implantable) cardiac defibrillator: Secondary | ICD-10-CM | POA: Diagnosis not present

## 2015-10-31 DIAGNOSIS — I472 Ventricular tachycardia, unspecified: Secondary | ICD-10-CM

## 2015-10-31 DIAGNOSIS — I5022 Chronic systolic (congestive) heart failure: Secondary | ICD-10-CM

## 2015-10-31 NOTE — Progress Notes (Signed)
Remote ICD transmission.   

## 2015-11-01 ENCOUNTER — Telehealth: Payer: Self-pay

## 2015-11-01 NOTE — Progress Notes (Signed)
EPIC Encounter for ICM Monitoring  Patient Name: Tracy Mills is a 70 y.o. female Date: 11/01/2015 Primary Care Physican: Ignatius Specking, MD Primary Cardiologist: Purvis Sheffield Electrophysiologist: Allred Dry Weight: unknown   Bi-V Pacing 93.4%      In the past month, have you:  1. Gained more than 2 pounds in a day or more than 5 pounds in a week? N/A  2. Had changes in your medications (with verification of current medications)? N/A  3. Had more shortness of breath than is usual for you? N/A  4. Limited your activity because of shortness of breath? N/A  5. Not been able to sleep because of shortness of breath? N/A  6. Had increased swelling in your feet or ankles? N/A  7. Had symptoms of dehydration (dizziness, dry mouth, increased thirst, decreased urine output) N/A  8. Had changes in sodium restriction? N/A  9. Been compliant with medication? N/A   ICM trend: 3 month view for 10/31/2015   ICM trend: 1 year view for 10/31/2015   Follow-up plan: ICM clinic phone appointment on 12/06/2015.  Attempted call to patient and unable to reach.  Transmission reviewed.  Thoracic impedance below reference line from 09/29/2015 to 10/07/2015 suggesting fluid accumulation and returned to baseline 10/08/2015 suggesting stable fluid levels.     [name], RN, CCM 11/01/2015 9:43 AM

## 2015-11-01 NOTE — Telephone Encounter (Signed)
Remote ICM transmission received.  Attempted patient call on home and mobile number and left message for return call.

## 2015-11-09 LAB — CUP PACEART REMOTE DEVICE CHECK
Battery Voltage: 2.9 V
Brady Statistic AP VP Percent: 37.8 %
Brady Statistic AS VP Percent: 60.44 %
Brady Statistic RA Percent Paced: 38.62 %
HIGH POWER IMPEDANCE MEASURED VALUE: 51 Ohm
HighPow Impedance: 40 Ohm
Implantable Lead Implant Date: 20110119
Implantable Lead Location: 753859
Lead Channel Impedance Value: 342 Ohm
Lead Channel Impedance Value: 399 Ohm
Lead Channel Impedance Value: 418 Ohm
Lead Channel Impedance Value: 589 Ohm
Lead Channel Impedance Value: 608 Ohm
Lead Channel Pacing Threshold Pulse Width: 0.4 ms
Lead Channel Sensing Intrinsic Amplitude: 2 mV
Lead Channel Sensing Intrinsic Amplitude: 31.625 mV
Lead Channel Sensing Intrinsic Amplitude: 31.625 mV
Lead Channel Setting Pacing Amplitude: 2 V
Lead Channel Setting Pacing Amplitude: 2.5 V
Lead Channel Setting Pacing Pulse Width: 0.4 ms
MDC IDC LEAD IMPLANT DT: 20110119
MDC IDC LEAD LOCATION: 753860
MDC IDC MSMT BATTERY REMAINING LONGEVITY: 73 mo
MDC IDC MSMT LEADCHNL LV IMPEDANCE VALUE: 342 Ohm
MDC IDC MSMT LEADCHNL LV IMPEDANCE VALUE: 456 Ohm
MDC IDC MSMT LEADCHNL LV IMPEDANCE VALUE: 551 Ohm
MDC IDC MSMT LEADCHNL LV IMPEDANCE VALUE: 760 Ohm
MDC IDC MSMT LEADCHNL LV IMPEDANCE VALUE: 779 Ohm
MDC IDC MSMT LEADCHNL LV IMPEDANCE VALUE: 817 Ohm
MDC IDC MSMT LEADCHNL LV PACING THRESHOLD AMPLITUDE: 1.375 V
MDC IDC MSMT LEADCHNL LV PACING THRESHOLD PULSEWIDTH: 0.4 ms
MDC IDC MSMT LEADCHNL RA PACING THRESHOLD AMPLITUDE: 0.5 V
MDC IDC MSMT LEADCHNL RA PACING THRESHOLD PULSEWIDTH: 0.4 ms
MDC IDC MSMT LEADCHNL RA SENSING INTR AMPL: 2 mV
MDC IDC MSMT LEADCHNL RV IMPEDANCE VALUE: 418 Ohm
MDC IDC MSMT LEADCHNL RV IMPEDANCE VALUE: 475 Ohm
MDC IDC MSMT LEADCHNL RV PACING THRESHOLD AMPLITUDE: 1 V
MDC IDC SESS DTM: 20170606073524
MDC IDC SET LEADCHNL RV PACING AMPLITUDE: 2.5 V
MDC IDC SET LEADCHNL RV PACING PULSEWIDTH: 0.4 ms
MDC IDC SET LEADCHNL RV SENSING SENSITIVITY: 0.3 mV
MDC IDC STAT BRADY AP VS PERCENT: 0.82 %
MDC IDC STAT BRADY AS VS PERCENT: 0.94 %
MDC IDC STAT BRADY RV PERCENT PACED: 20.54 %

## 2015-11-15 ENCOUNTER — Encounter: Payer: Self-pay | Admitting: Cardiology

## 2015-12-06 ENCOUNTER — Ambulatory Visit (INDEPENDENT_AMBULATORY_CARE_PROVIDER_SITE_OTHER): Payer: Medicare Other

## 2015-12-06 DIAGNOSIS — I5022 Chronic systolic (congestive) heart failure: Secondary | ICD-10-CM | POA: Diagnosis not present

## 2015-12-06 DIAGNOSIS — Z95810 Presence of automatic (implantable) cardiac defibrillator: Secondary | ICD-10-CM

## 2015-12-06 NOTE — Progress Notes (Signed)
EPIC Encounter for ICM Monitoring  Patient Name: Tracy Mills is a 70 y.o. female Date: 12/06/2015 Primary Care Physican: Ignatius Specking, MD Primary Cardiologist: Purvis Sheffield Electrophysiologist: Allred Dry Weight:  unknown Bi-V Pacing:  93.5%       Attempted patient call and unable to reach  Thoracic impedence decreased 11/08/2015 to 11/23/2015 and 11/28/2015 to 12/07/2015 suggesting fluid accumulation starting to trend toward baseline 12/07/2015.Marland Kitchen   ICM trend: 12/06/2015    Follow-up plan: ICM clinic phone appointment on 12/25/2015.  Copy of ICM check sent to device physician.   [name], RN 12/06/2015 9:42 AM

## 2015-12-07 ENCOUNTER — Telehealth: Payer: Self-pay

## 2015-12-07 NOTE — Telephone Encounter (Signed)
Remote ICM transmission received.  Attempted patient call and left message for return call.   

## 2015-12-21 ENCOUNTER — Other Ambulatory Visit: Payer: Self-pay | Admitting: *Deleted

## 2015-12-21 DIAGNOSIS — I5022 Chronic systolic (congestive) heart failure: Secondary | ICD-10-CM

## 2015-12-21 MED ORDER — LOSARTAN POTASSIUM 50 MG PO TABS: 50.0000 mg | ORAL_TABLET | Freq: Every day | ORAL | 3 refills | Status: DC

## 2015-12-21 MED ORDER — SIMVASTATIN 40 MG PO TABS: 40.0000 mg | ORAL_TABLET | Freq: Every day | ORAL | 3 refills | Status: DC

## 2015-12-21 MED ORDER — SPIRONOLACTONE 25 MG PO TABS
25.0000 mg | ORAL_TABLET | Freq: Every day | ORAL | 3 refills | Status: DC
Start: 2015-12-21 — End: 2017-02-11

## 2015-12-21 MED ORDER — CARVEDILOL 3.125 MG PO TABS: 3.1250 mg | ORAL_TABLET | Freq: Two times a day (BID) | ORAL | 3 refills | Status: DC

## 2015-12-21 MED ORDER — FUROSEMIDE 40 MG PO TABS: 40.0000 mg | ORAL_TABLET | Freq: Every day | ORAL | 3 refills | Status: DC

## 2015-12-25 ENCOUNTER — Telehealth: Payer: Self-pay | Admitting: Cardiology

## 2015-12-25 NOTE — Telephone Encounter (Signed)
LMOVM reminding pt to send remote transmission.   

## 2015-12-29 NOTE — Progress Notes (Signed)
No remote ICM transmission received for 12/25/2015.  Next ICM remote transmission scheduled 01/30/2016.

## 2016-01-30 ENCOUNTER — Telehealth: Payer: Self-pay

## 2016-01-30 ENCOUNTER — Telehealth: Payer: Self-pay | Admitting: Cardiology

## 2016-01-30 ENCOUNTER — Ambulatory Visit (INDEPENDENT_AMBULATORY_CARE_PROVIDER_SITE_OTHER): Payer: Medicare HMO | Admitting: *Deleted

## 2016-01-30 ENCOUNTER — Ambulatory Visit (INDEPENDENT_AMBULATORY_CARE_PROVIDER_SITE_OTHER): Payer: Medicare HMO

## 2016-01-30 DIAGNOSIS — I472 Ventricular tachycardia, unspecified: Secondary | ICD-10-CM

## 2016-01-30 DIAGNOSIS — I5022 Chronic systolic (congestive) heart failure: Secondary | ICD-10-CM | POA: Diagnosis not present

## 2016-01-30 DIAGNOSIS — Z95810 Presence of automatic (implantable) cardiac defibrillator: Secondary | ICD-10-CM

## 2016-01-30 NOTE — Telephone Encounter (Signed)
Attempted ICM call and left voice mail message to send remote transmission from home today to check fluid levels.

## 2016-01-30 NOTE — Telephone Encounter (Signed)
LMOVM reminding pt to send remote transmission.   

## 2016-01-31 NOTE — Progress Notes (Signed)
Remote ICD transmission.   

## 2016-02-01 NOTE — Progress Notes (Signed)
EPIC Encounter for ICM Monitoring  Patient Name: Tracy Mills is a 70 y.o. female Date: 02/01/2016 Primary Care Physican: Ignatius Specking, MD Primary Cardiologist: Purvis Sheffield Electrophysiologist: Allred Dry Weight: unknown Bi-V Pacing:  95.5%       Heart Failure questions reviewed, pt asymptomatic.  Thoracic impedance normal.  ecommendations: No changes.  Low sodium diet education provided.    Follow-up plan: ICM clinic phone appointment on 03/06/2016.  Copy of ICM check sent to device physician.   ICM trend: 01/31/2016       [name], RN 02/01/2016 10:23 AM

## 2016-02-02 ENCOUNTER — Encounter: Payer: Self-pay | Admitting: Cardiology

## 2016-02-06 LAB — CUP PACEART REMOTE DEVICE CHECK
Brady Statistic AP VP Percent: 55.12 %
Brady Statistic AP VS Percent: 0.99 %
Brady Statistic AS VS Percent: 0.61 %
Brady Statistic RV Percent Paced: 33.59 %
Date Time Interrogation Session: 20170906181706
HIGH POWER IMPEDANCE MEASURED VALUE: 38 Ohm
HighPow Impedance: 47 Ohm
Implantable Lead Implant Date: 20110119
Implantable Lead Location: 753860
Lead Channel Impedance Value: 304 Ohm
Lead Channel Impedance Value: 475 Ohm
Lead Channel Impedance Value: 532 Ohm
Lead Channel Impedance Value: 779 Ohm
Lead Channel Impedance Value: 836 Ohm
Lead Channel Pacing Threshold Amplitude: 0.5 V
Lead Channel Pacing Threshold Amplitude: 0.875 V
Lead Channel Pacing Threshold Amplitude: 2.375 V
Lead Channel Pacing Threshold Pulse Width: 0.4 ms
Lead Channel Pacing Threshold Pulse Width: 0.4 ms
Lead Channel Sensing Intrinsic Amplitude: 2 mV
Lead Channel Setting Pacing Amplitude: 2 V
Lead Channel Setting Pacing Amplitude: 2.5 V
Lead Channel Setting Pacing Pulse Width: 0.4 ms
Lead Channel Setting Sensing Sensitivity: 0.3 mV
MDC IDC LEAD IMPLANT DT: 20110119
MDC IDC LEAD LOCATION: 753859
MDC IDC MSMT BATTERY REMAINING LONGEVITY: 53 mo
MDC IDC MSMT BATTERY VOLTAGE: 2.91 V
MDC IDC MSMT LEADCHNL LV IMPEDANCE VALUE: 361 Ohm
MDC IDC MSMT LEADCHNL LV IMPEDANCE VALUE: 399 Ohm
MDC IDC MSMT LEADCHNL LV IMPEDANCE VALUE: 456 Ohm
MDC IDC MSMT LEADCHNL LV IMPEDANCE VALUE: 589 Ohm
MDC IDC MSMT LEADCHNL LV IMPEDANCE VALUE: 589 Ohm
MDC IDC MSMT LEADCHNL LV IMPEDANCE VALUE: 779 Ohm
MDC IDC MSMT LEADCHNL RA IMPEDANCE VALUE: 399 Ohm
MDC IDC MSMT LEADCHNL RA SENSING INTR AMPL: 2 mV
MDC IDC MSMT LEADCHNL RV IMPEDANCE VALUE: 342 Ohm
MDC IDC MSMT LEADCHNL RV PACING THRESHOLD PULSEWIDTH: 0.4 ms
MDC IDC MSMT LEADCHNL RV SENSING INTR AMPL: 31.625 mV
MDC IDC MSMT LEADCHNL RV SENSING INTR AMPL: 31.625 mV
MDC IDC SET LEADCHNL LV PACING AMPLITUDE: 4.25 V
MDC IDC SET LEADCHNL LV PACING PULSEWIDTH: 0.4 ms
MDC IDC STAT BRADY AS VP PERCENT: 43.28 %
MDC IDC STAT BRADY RA PERCENT PACED: 56.11 %

## 2016-03-06 ENCOUNTER — Ambulatory Visit (INDEPENDENT_AMBULATORY_CARE_PROVIDER_SITE_OTHER): Payer: Medicare HMO

## 2016-03-06 DIAGNOSIS — Z95810 Presence of automatic (implantable) cardiac defibrillator: Secondary | ICD-10-CM | POA: Diagnosis not present

## 2016-03-06 DIAGNOSIS — I5022 Chronic systolic (congestive) heart failure: Secondary | ICD-10-CM | POA: Diagnosis not present

## 2016-03-07 ENCOUNTER — Telehealth: Payer: Self-pay

## 2016-03-07 NOTE — Telephone Encounter (Signed)
Remote ICM transmission received.  Attempted patient call and left message to return call.   

## 2016-03-07 NOTE — Progress Notes (Addendum)
Patient returned call.  She stated she is doing well and denied any fluid symptoms.  No changes today.  Encouraged to call for any fluid symptoms and next ICM remote transmission is 04/30/2016 and office appointment on 03/29/2016 with Dr Johney Frame.

## 2016-03-07 NOTE — Progress Notes (Signed)
EPIC Encounter for ICM Monitoring  Patient Name: Tracy Mills is a 70 y.o. female Date: 03/07/2016 Primary Care Physican: Ignatius Specking, MD Primary Cardiologist:Koneswaran Electrophysiologist: Allred Dry Weight:    unknown Bi-V Pacing:  96.3%           Attempted ICM call and unable to reach.  Transmission reviewed.   Thoracic impedance normal   Follow-up plan: ICM clinic phone appointment on 04/29/2016.  Office appointment with Dr Johney Frame on 03/29/2016.    Copy of ICM check sent to device physician.   ICM trend: 03/06/2016       [name], RN 03/07/2016 2:05 PM

## 2016-03-29 ENCOUNTER — Encounter: Payer: Self-pay | Admitting: Internal Medicine

## 2016-03-29 ENCOUNTER — Ambulatory Visit (INDEPENDENT_AMBULATORY_CARE_PROVIDER_SITE_OTHER): Payer: Medicare HMO | Admitting: Internal Medicine

## 2016-03-29 VITALS — BP 116/76 | HR 82 | Ht 63.0 in | Wt 137.2 lb

## 2016-03-29 DIAGNOSIS — I472 Ventricular tachycardia, unspecified: Secondary | ICD-10-CM

## 2016-03-29 DIAGNOSIS — I5022 Chronic systolic (congestive) heart failure: Secondary | ICD-10-CM | POA: Diagnosis not present

## 2016-03-29 DIAGNOSIS — I255 Ischemic cardiomyopathy: Secondary | ICD-10-CM

## 2016-03-29 DIAGNOSIS — Z95810 Presence of automatic (implantable) cardiac defibrillator: Secondary | ICD-10-CM

## 2016-03-29 DIAGNOSIS — R0602 Shortness of breath: Secondary | ICD-10-CM | POA: Diagnosis not present

## 2016-03-29 LAB — CUP PACEART INCLINIC DEVICE CHECK
Brady Statistic AP VS Percent: 0.9 %
Brady Statistic AS VP Percent: 52.6 %
Brady Statistic AS VS Percent: 0.9 %
Implantable Lead Implant Date: 20110119
Implantable Lead Location: 753860
Implantable Lead Model: 5076
Implantable Lead Model: 6947
Lead Channel Impedance Value: 456 Ohm
Lead Channel Impedance Value: 722 Ohm
Lead Channel Pacing Threshold Amplitude: 1 V
Lead Channel Pacing Threshold Pulse Width: 0.4 ms
Lead Channel Sensing Intrinsic Amplitude: 20 mV
Lead Channel Setting Pacing Amplitude: 2 V
Lead Channel Setting Pacing Amplitude: 2.5 V
Lead Channel Setting Pacing Pulse Width: 0.4 ms
Lead Channel Setting Pacing Pulse Width: 0.8 ms
MDC IDC LEAD IMPLANT DT: 20110119
MDC IDC LEAD LOCATION: 753859
MDC IDC MSMT BATTERY REMAINING LONGEVITY: 54 mo
MDC IDC MSMT LEADCHNL LV PACING THRESHOLD AMPLITUDE: 1.5 V
MDC IDC MSMT LEADCHNL LV PACING THRESHOLD PULSEWIDTH: 0.8 ms
MDC IDC MSMT LEADCHNL RA IMPEDANCE VALUE: 399 Ohm
MDC IDC MSMT LEADCHNL RA PACING THRESHOLD AMPLITUDE: 0.5 V
MDC IDC MSMT LEADCHNL RA PACING THRESHOLD PULSEWIDTH: 0.4 ms
MDC IDC MSMT LEADCHNL RA SENSING INTR AMPL: 2.1 mV
MDC IDC PG IMPLANT DT: 20150623
MDC IDC SESS DTM: 20171103142247
MDC IDC SET LEADCHNL RV PACING AMPLITUDE: 2.5 V
MDC IDC SET LEADCHNL RV SENSING SENSITIVITY: 0.3 mV
MDC IDC STAT BRADY AP VP PERCENT: 45.6 %

## 2016-03-29 MED ORDER — NITROGLYCERIN 0.4 MG SL SUBL: 0.4000 mg | SUBLINGUAL_TABLET | SUBLINGUAL | 3 refills | Status: DC | PRN

## 2016-03-29 NOTE — Patient Instructions (Addendum)
Medication Instructions:  Continue all current medications.  Labwork: none  Testing/Procedures:  Your physician has requested that you have an echocardiogram. Echocardiography is a painless test that uses sound waves to create images of your heart. It provides your doctor with information about the size and shape of your heart and how well your heart's chambers and valves are working. This procedure takes approximately one hour. There are no restrictions for this procedure.  Office will contact with results via phone or letter.    Follow-Up: Your physician wants you to follow up in:  1 year.  You will receive a reminder letter in the mail one-two months in advance.  If you don't receive a letter, please call our office to schedule the follow up appointment - Dr. Johney Frame.    Any Other Special Instructions Will Be Listed Below (If Applicable). Remote monitoring is used to monitor your Pacemaker of ICD from home. This monitoring reduces the number of office visits required to check your device to one time per year. It allows [place] to keep an eye on the functioning of your device to ensure it is working properly. You are scheduled for a device check from home on 07/01/2016. You may send your transmission at any time that day. If you have a wireless device, the transmission will be sent automatically. After your physician reviews your transmission, you will receive a postcard with your next transmission date.  If you need a refill on your cardiac medications before your next appointment, please call your pharmacy.

## 2016-03-29 NOTE — Progress Notes (Signed)
PCP: Ignatius Speckinghruv B Vyas, MD Primary Cardiologist:  Dr Orland JarredKoneswaran  Britnay Elige Radon Tracy Mills is a 70 y.o. female who presents today for routine electrophysiology followup.  She is doing well.  Occasional SOB. Continues to smoke.   Today, she denies symptoms of palpitations, chest pain,  lower extremity edema, dizziness, presyncope, syncope, or ICD shocks.  The patient is otherwise without complaint today.   Past Medical History:  Diagnosis Date  . Chronic systolic congestive heart failure (HCC)   . Coronary artery disease    anterior MI in 2010. LAD stent placement 2010. EF: 15%  . Hypertension   . Ischemic cardiomyopathy   . LBBB (left bundle branch block)   . MR (mitral regurgitation)    moderate to severe  . Tobacco abuse   . VF (ventricular fibrillation) (HCC) 2013   nonsustained VF, spontaneously termianted   Past Surgical History:  Procedure Laterality Date  . BI-VENTRICULAR IMPLANTABLE CARDIOVERTER DEFIBRILLATOR UPGRADE  11-16-2013   upgrade of previously implanted dual chamber ICD to MDT Ovidio KinViva Quad XT CRTD by Dr Johney FrameAllred  . BI-VENTRICULAR IMPLANTABLE CARDIOVERTER DEFIBRILLATOR UPGRADE N/A 11/16/2013   Procedure: BI-VENTRICULAR IMPLANTABLE CARDIOVERTER DEFIBRILLATOR UPGRADE;  Surgeon: Gardiner RhymeJames D Lili Harts, MD;  [facility];  Service: Cardiovascular;  Laterality: N/A;  . CARDIAC CATHETERIZATION  2010   LAD PCI and stent placement  . CARDIAC DEFIBRILLATOR PLACEMENT  06/14/09   MDT [facility] Dr Johney FrameAllred  . TONSILLECTOMY AND ADENOIDECTOMY    . TUBAL LIGATION      Current Outpatient Prescriptions  Medication Sig Dispense Refill  . acetaminophen (TYLENOL) 500 MG tablet Take 500 mg by mouth daily.    Marland Kitchen. aspirin 81 MG tablet Take 81 mg by mouth daily.      . carvedilol (COREG) 3.125 MG tablet Take 1 tablet (3.125 mg total) by mouth 2 (two) times daily. 180 tablet 3  . furosemide (LASIX) 40 MG tablet Take 1 tablet (40 mg total) by mouth daily. 90 tablet 3  . losartan (COZAAR) 50 MG tablet Take 1  tablet (50 mg total) by mouth daily. 90 tablet 3  . nitroGLYCERIN (NITROSTAT) 0.4 MG SL tablet Place 1 tablet (0.4 mg total) under the tongue every 5 (five) minutes as needed for chest pain. 25 tablet 3  . simvastatin (ZOCOR) 40 MG tablet Take 1 tablet (40 mg total) by mouth daily. 90 tablet 3  . spironolactone (ALDACTONE) 25 MG tablet Take 1 tablet (25 mg total) by mouth daily. 90 tablet 3   No current facility-administered medications for this visit.     Physical Exam: Vitals:   03/29/16 1124  BP: 116/76  Pulse: 82  SpO2: 100%  Weight: 137 lb 3.2 oz (62.2 kg)  Height: 5\' 3"  (1.6 m)    GEN- The patient is well appearing, alert and oriented x 3 today.   Head- normocephalic, atraumatic Eyes-  Sclera clear, conjunctiva pink Ears- hearing intact Oropharynx- clear Lungs- Clear to ausculation bilaterally, normal work of breathing Chest- ICD pocket is well healed Heart- Regular rate and rhythm, no murmurs, wide S2 split GI- soft, NT, ND, + BS Extremities- no clubbing, cyanosis, or edema  ICD interrogation- reviewed in detail today,  See PACEART report  Assessment and Plan:  1.  Chronic systolic dysfunction euvolemic today Stable on an appropriate medical regimen Normal ICD function See Pace Art report No changes today Followed in ICM device clinic  EF previously 30% Will repeat echo and schedule for EKG optimization of device [place] in [place].  CXR reveals good lead position.  2. VT- previous NSVT Normal ICD function.  3. Tobacco Cessation is advised She is trying to quit  [facility]  Return to see me in 1 year Dr Purvis Sheffield to see as scheduled  Hillis Range MD, [facility] 03/29/2016 12:28 PM

## 2016-04-16 ENCOUNTER — Other Ambulatory Visit: Payer: Medicare HMO

## 2016-04-30 ENCOUNTER — Telehealth: Payer: Self-pay

## 2016-04-30 NOTE — Telephone Encounter (Signed)
Call to patient and requested to send remote ICM transmission.  She stated she was going to hospital to see her mother and said she would send either later today or tomorrow.

## 2016-04-30 NOTE — Progress Notes (Signed)
error 

## 2016-05-02 NOTE — Telephone Encounter (Signed)
Patient called back stating she cannot get her monitor to work.  Advised her to call [employer] and she stated she has the number.  She will send a remote transmission when it is working again.

## 2016-05-06 NOTE — Telephone Encounter (Signed)
No ICM remote transmission received on 04/29/2016.   Next ICM transmission scheduled for 05/31/2016.

## 2016-05-08 ENCOUNTER — Other Ambulatory Visit: Payer: Medicare HMO

## 2016-05-31 ENCOUNTER — Telehealth: Payer: Self-pay | Admitting: Cardiology

## 2016-05-31 ENCOUNTER — Telehealth: Payer: Self-pay

## 2016-05-31 ENCOUNTER — Ambulatory Visit (INDEPENDENT_AMBULATORY_CARE_PROVIDER_SITE_OTHER): Payer: Medicare HMO

## 2016-05-31 DIAGNOSIS — I5022 Chronic systolic (congestive) heart failure: Secondary | ICD-10-CM

## 2016-05-31 DIAGNOSIS — Z95810 Presence of automatic (implantable) cardiac defibrillator: Secondary | ICD-10-CM | POA: Diagnosis not present

## 2016-05-31 NOTE — Progress Notes (Signed)
EPIC Encounter for ICM Monitoring  Patient Name: Tracy Mills is a 71 y.o. female Date: 05/31/2016 Primary Care Physican: Ignatius Specking, MD Primary Cardiologist:Koneswaran Electrophysiologist: Allred Dry Weight:unknown Bi-V Pacing:  87.9% which is decreased from96.3%on 03/06/2016   Observations (1) (29-Mar-2016 to 31-May-2016) ?V. Pacing < 90%  ?1 Monitored VT  ?4166 V. Sensing Episodes  ?4 seconds in AT/AF Since Last Session       Attempted ICM call and unable to reach.  Left message to return call.   Transmission reviewed.   Thoracic impedance at normal today, 05/31/2016 and was abnormal suggesting fluid accumulation 05/21/2017 to 05/31/2016.  Recommendations: NONE- Unable to reach    Follow-up plan: ICM clinic phone appointment on 06/04/2016.  Copy of ICM check sent to primary cardiologist and device physician.   3 month ICM trend : 05/31/2016   1 Year ICM trend:      [name], RN 05/31/2016 4:48 PM

## 2016-05-31 NOTE — Telephone Encounter (Signed)
Spoke with pt and reminded pt of remote transmission that is due today. Pt verbalized understanding.   

## 2016-05-31 NOTE — Telephone Encounter (Signed)
Remote ICM transmission received.  Attempted patient call and left message to return call.   

## 2016-06-04 ENCOUNTER — Ambulatory Visit (INDEPENDENT_AMBULATORY_CARE_PROVIDER_SITE_OTHER): Payer: Medicare HMO

## 2016-06-04 DIAGNOSIS — Z95810 Presence of automatic (implantable) cardiac defibrillator: Secondary | ICD-10-CM

## 2016-06-04 DIAGNOSIS — I5022 Chronic systolic (congestive) heart failure: Secondary | ICD-10-CM

## 2016-06-04 NOTE — Progress Notes (Signed)
EPIC Encounter for ICM Monitoring  Patient Name: Tracy Mills is a 71 y.o. female Date: 06/04/2016 Primary Care Physican: Ignatius Specking, MD Primary Cardiologist:Koneswaran Electrophysiologist: Allred Dry Weight:unknown Bi-V Pacing:  95.1%         Heart Failure questions reviewed, pt has a cold with chest congestion which she thinks is causing the fluid accumulation.   Thoracic impedance abnormal suggesting fluid accumulation for the last few days but is trending back toward baseline today.  Recommendations:   Advised will monitor fluid level and check transmission again on 06/10/2016.  Copy of ICM note sent to Dr Purvis Sheffield and Dr Johney Frame for any recommendations.   No labs for 2017.     Follow-up plan: ICM clinic phone appointment on 06/10/2016.  Copy of ICM check sent to primary cardiologist and device physician.   3 month ICM trend : 06/04/2016   1 Year ICM trend:      [name], RN 06/04/2016 10:35 AM

## 2016-06-05 ENCOUNTER — Telehealth: Payer: Self-pay | Admitting: *Deleted

## 2016-06-05 NOTE — Telephone Encounter (Signed)
Last seen by Dr. Johney Frame on 03/29/2016.  He requested that she have Echo.  Initially scheduled on 04/16/16 - cx due to sickness, rescheduled for 05/08/16 - no show.    Call placed to patient - stated that she will call back later to reschedule.

## 2016-06-06 NOTE — Progress Notes (Signed)
Received: Yesterday  Message Contents  Laqueta Linden, MD  [name], RN        If trending back downwards, maybe just observe for now.

## 2016-06-10 ENCOUNTER — Ambulatory Visit (INDEPENDENT_AMBULATORY_CARE_PROVIDER_SITE_OTHER): Payer: Medicare PPO

## 2016-06-10 ENCOUNTER — Telehealth: Payer: Self-pay

## 2016-06-10 DIAGNOSIS — Z95810 Presence of automatic (implantable) cardiac defibrillator: Secondary | ICD-10-CM

## 2016-06-10 DIAGNOSIS — I5022 Chronic systolic (congestive) heart failure: Secondary | ICD-10-CM

## 2016-06-10 NOTE — Progress Notes (Signed)
EPIC Encounter for ICM Monitoring  Patient Name: Tracy Mills is a 71 y.o. female Date: 06/10/2016 Primary Care Physican: [name], MD Primary [place] Electrophysiologist: Allred Dry Weight:unknown Bi-V Pacing: 92.6%       .Attempted ICM call and unable to reach.  Left message to return call.  Transmission reviewed.   Thoracic impedance continues to be abnormal, since last ICM transmission on 1/9, suggesting fluid accumulation  Labs: 10/14/2014 Creatinine 1.38, BUN 26, Potassium 4.4, Sodium 138, EGFR 39-45  Recommendations:  NONE - unable to reach.   Follow-up plan: ICM clinic phone appointment on 06/20/2016 to recheck fluid levels.  Copy of ICM check sent to primary cardiologist and device physician.   3 month ICM trend: 06/10/2016   1 Year ICM trend:      Rosalene Billings, RN 06/10/2016 8:55 AM

## 2016-06-10 NOTE — Telephone Encounter (Signed)
Remote ICM transmission received.  Attempted patient call and left message to return call.   

## 2016-06-20 ENCOUNTER — Telehealth: Payer: Self-pay | Admitting: Cardiology

## 2016-06-20 NOTE — Telephone Encounter (Signed)
LMOVM reminding pt to send remote transmission.   

## 2016-07-01 ENCOUNTER — Ambulatory Visit (INDEPENDENT_AMBULATORY_CARE_PROVIDER_SITE_OTHER): Payer: Medicare PPO | Admitting: *Deleted

## 2016-07-01 DIAGNOSIS — Z95810 Presence of automatic (implantable) cardiac defibrillator: Secondary | ICD-10-CM

## 2016-07-01 DIAGNOSIS — I5022 Chronic systolic (congestive) heart failure: Secondary | ICD-10-CM

## 2016-07-01 DIAGNOSIS — I255 Ischemic cardiomyopathy: Secondary | ICD-10-CM

## 2016-07-01 NOTE — Progress Notes (Signed)
EPIC Encounter for ICM Monitoring  Patient Name: Tracy Mills is a 71 y.o. female Date: 07/01/2016 Primary Care Physican: [name], MD Primary [place] Electrophysiologist: Allred Dry Weight:135 lbs Bi-V Pacing: 91.5%       Heart Failure questions reviewed, pt reported having cough due to chest cold for a few weeks.  She asked what cold meds she could take and advised Coricidin brand is best brand since it does not have decongestants.   Thoracic impedance abnormal suggesting fluid accumulation.  Labs: 10/14/2014 Creatinine 1.38, BUN 26, Potassium 4.4, Sodium 138, EGFR 39-45  Recommendations:  Advised would send copy of ICM check to Dr [name] and Dr [name] for review and recommendations.   Follow-up plan: ICM clinic phone appointment on 07/11/2016.  3 month ICM trend: 07/01/2016   1 Year ICM trend:      Rosalene Billings, RN 07/01/2016 1:04 PM

## 2016-07-02 LAB — CUP PACEART REMOTE DEVICE CHECK
Battery Remaining Longevity: 52 mo
Brady Statistic AP VS Percent: 0.77 %
Brady Statistic RA Percent Paced: 33.55 %
HIGH POWER IMPEDANCE MEASURED VALUE: 38 Ohm
HighPow Impedance: 47 Ohm
Implantable Lead Implant Date: 20110119
Implantable Lead Location: 753859
Implantable Lead Model: 6947
Implantable Pulse Generator Implant Date: 20150623
Lead Channel Impedance Value: 342 Ohm
Lead Channel Impedance Value: 361 Ohm
Lead Channel Impedance Value: 418 Ohm
Lead Channel Impedance Value: 513 Ohm
Lead Channel Impedance Value: 722 Ohm
Lead Channel Impedance Value: 874 Ohm
Lead Channel Impedance Value: 893 Ohm
Lead Channel Pacing Threshold Amplitude: 0.625 V
Lead Channel Pacing Threshold Amplitude: 1.5 V
Lead Channel Pacing Threshold Pulse Width: 0.4 ms
Lead Channel Pacing Threshold Pulse Width: 0.8 ms
Lead Channel Sensing Intrinsic Amplitude: 1 mV
Lead Channel Sensing Intrinsic Amplitude: 28 mV
Lead Channel Sensing Intrinsic Amplitude: 28 mV
Lead Channel Setting Pacing Amplitude: 2 V
Lead Channel Setting Pacing Amplitude: 2.5 V
Lead Channel Setting Pacing Amplitude: 2.5 V
Lead Channel Setting Pacing Pulse Width: 0.4 ms
Lead Channel Setting Pacing Pulse Width: 0.8 ms
MDC IDC LEAD IMPLANT DT: 20110119
MDC IDC LEAD LOCATION: 753860
MDC IDC MSMT BATTERY VOLTAGE: 2.97 V
MDC IDC MSMT LEADCHNL LV IMPEDANCE VALUE: 456 Ohm
MDC IDC MSMT LEADCHNL LV IMPEDANCE VALUE: 456 Ohm
MDC IDC MSMT LEADCHNL LV IMPEDANCE VALUE: 608 Ohm
MDC IDC MSMT LEADCHNL LV IMPEDANCE VALUE: 703 Ohm
MDC IDC MSMT LEADCHNL LV IMPEDANCE VALUE: 931 Ohm
MDC IDC MSMT LEADCHNL RA IMPEDANCE VALUE: 399 Ohm
MDC IDC MSMT LEADCHNL RA SENSING INTR AMPL: 1 mV
MDC IDC MSMT LEADCHNL RV PACING THRESHOLD AMPLITUDE: 1 V
MDC IDC MSMT LEADCHNL RV PACING THRESHOLD PULSEWIDTH: 0.4 ms
MDC IDC SESS DTM: 20180205182404
MDC IDC SET LEADCHNL RV SENSING SENSITIVITY: 0.3 mV
MDC IDC STAT BRADY AP VP PERCENT: 32.88 %
MDC IDC STAT BRADY AS VP PERCENT: 65.32 %
MDC IDC STAT BRADY AS VS PERCENT: 1.03 %
MDC IDC STAT BRADY RV PERCENT PACED: 14.87 %

## 2016-07-02 NOTE — Progress Notes (Signed)
Call to patient and advised Dr Purvis Sheffield recommended she increase Furosemide to 40 mg bid x 3 days and she will start today.  BMET ordered for 07/05/2016 and she usually goes to [facility] on [address] in [place] to have blood drawn.  Will recheck fluid levels on 07/05/2016.  She verbalized understanding.

## 2016-07-02 NOTE — Progress Notes (Signed)
Attempted patient call to advise of Dr Junius Argyle recommendation and left voice mail message to return call.

## 2016-07-02 NOTE — Progress Notes (Signed)
Increase Lasix to 40 mg bid x 3 days, check BMET afterwards.

## 2016-07-02 NOTE — Addendum Note (Signed)
Addended by: [name] on: 07/02/2016 04:07 PM   Modules accepted: Orders

## 2016-07-02 NOTE — Progress Notes (Signed)
Remote ICD transmission.   

## 2016-07-03 ENCOUNTER — Encounter: Payer: Self-pay | Admitting: Cardiology

## 2016-07-05 ENCOUNTER — Ambulatory Visit (INDEPENDENT_AMBULATORY_CARE_PROVIDER_SITE_OTHER): Payer: Medicare PPO

## 2016-07-05 DIAGNOSIS — I5022 Chronic systolic (congestive) heart failure: Secondary | ICD-10-CM

## 2016-07-05 DIAGNOSIS — Z95810 Presence of automatic (implantable) cardiac defibrillator: Secondary | ICD-10-CM

## 2016-07-05 NOTE — Progress Notes (Signed)
EPIC Encounter for ICM Monitoring  Patient Name: Tracy Mills is a 71 y.o. female Date: 07/05/2016 Primary Care Physican: Ignatius Specking, MD Primary Cardiologist:Koneswaran Electrophysiologist: Allred Dry Weight:135 lbs Bi-V Pacing: 91.5%        Heart Failure questions reviewed, pt asymptomatic   Thoracic impedance returned to normal after 3 days of increased Furosemide.   Recommendations: No changes. Reminded to limit dietary salt intake to 2000 mg/day and fluid intake to < 2 liters/day. Encouraged to call for fluid symptoms.  Follow-up plan: ICM clinic phone appointment on 08/01/2016.  Copy of ICM check sent to primary cardiologist and device physician.   3 month ICM trend: 07/05/2016   1 Year ICM trend:      [name], RN 07/05/2016 8:00 AM

## 2016-07-11 ENCOUNTER — Telehealth: Payer: Self-pay

## 2016-07-11 NOTE — Telephone Encounter (Signed)
Call to [facility] at Dr Bonnita Levan office and spoke with Inetta Fermo.  Advised patient called and said BMET order was not received and she is waiting at the lab.  Inetta Fermo stated she never received the faxed order from last week.  Refaxed order today to FAx [phone] and confirmation received the fax was successful.

## 2016-07-12 ENCOUNTER — Other Ambulatory Visit: Payer: Self-pay | Admitting: *Deleted

## 2016-07-12 DIAGNOSIS — I5022 Chronic systolic (congestive) heart failure: Secondary | ICD-10-CM

## 2016-07-16 ENCOUNTER — Telehealth: Payer: Self-pay | Admitting: *Deleted

## 2016-07-16 NOTE — Telephone Encounter (Signed)
Notes Recorded by Lesle Chris, LPN on 5/88/3254 at 9:57 AM EST Patient notified via voice mail. Copy to pmd. ------  Notes Recorded by Laqueta Linden, MD on 07/13/2016 at 12:12 PM EST

## 2016-08-01 ENCOUNTER — Ambulatory Visit (INDEPENDENT_AMBULATORY_CARE_PROVIDER_SITE_OTHER): Payer: Medicare PPO

## 2016-08-01 DIAGNOSIS — I5022 Chronic systolic (congestive) heart failure: Secondary | ICD-10-CM | POA: Diagnosis not present

## 2016-08-01 DIAGNOSIS — Z95810 Presence of automatic (implantable) cardiac defibrillator: Secondary | ICD-10-CM | POA: Diagnosis not present

## 2016-08-01 NOTE — Progress Notes (Signed)
EPIC Encounter for ICM Monitoring  Patient Name: Tracy Mills is a 71 y.o. female Date: 08/01/2016 Primary Care Physican: [name], MD Primary [place] Electrophysiologist: Allred Dry Weight:135 lbs Bi-V Pacing: 93.6%          Heart Failure questions reviewed, pt asymptomatic.   Thoracic impedance normal.  Prescribed and confirmed dosage: Furosemide 40 mg 1 tablet daily  Labs:  07/11/2016 Creatinine 1.20, BUN 23, Potassium 4.1, Sodium 139, EGFR 46-53  Recommendations: No changes. Reminded to limit dietary salt intake to 2000 mg/day and fluid intake to < 2 liters/day. Encouraged to call for fluid symptoms.  Follow-up plan: ICM clinic phone appointment on 08/30/2016.  Copy of ICM check sent to device physician.   3 month ICM trend: 08/01/2016    1 Year ICM trend:      Tracy Billings, RN 08/01/2016 12:24 PM

## 2016-08-27 ENCOUNTER — Telehealth: Payer: Self-pay | Admitting: Internal Medicine

## 2016-08-27 NOTE — Telephone Encounter (Signed)
Pre-cert Verification for the following procedure   Echo scheduled for 10/04/16 at [facility].

## 2016-08-30 ENCOUNTER — Ambulatory Visit (INDEPENDENT_AMBULATORY_CARE_PROVIDER_SITE_OTHER): Payer: Medicare PPO

## 2016-08-30 DIAGNOSIS — Z95810 Presence of automatic (implantable) cardiac defibrillator: Secondary | ICD-10-CM

## 2016-08-30 DIAGNOSIS — I5022 Chronic systolic (congestive) heart failure: Secondary | ICD-10-CM | POA: Diagnosis not present

## 2016-08-30 NOTE — Progress Notes (Signed)
EPIC Encounter for ICM Monitoring  Patient Name: Tracy Mills is a 71 y.o. female Date: 08/30/2016 Primary Care Physican: [name], MD Primary [place] Electrophysiologist: Allred Dry Weight:135 lbs Bi-V Pacing: 94.5%        Heart Failure questions reviewed, pt asymptomatic.   Thoracic impedance normal but was abnormal suggesting fluid accumulation 08/09/2016 to 08/23/2016.  Prescribed and confirmed dosage: Furosemide 40 mg 1 tablet daily  Labs:  07/11/2016 Creatinine 1.20, BUN 23, Potassium 4.1, Sodium 139, EGFR 46-53  Recommendations: No changes. Discussed to limit salt intake to 2000 mg/day and fluid intake to < 2 liters/day.  Encouraged to call for fluid symptoms.  Follow-up plan: ICM clinic phone appointment on 09/30/2016.  Office appointment scheduled on 10/22/2016 with Dr [name].  Copy of ICM check sent to primary cardiologist and device physician.   3 month ICM trend: 08/30/2016   1 Year ICM trend:      Rosalene Billings, RN 08/30/2016 8:17 AM

## 2016-09-30 ENCOUNTER — Telehealth: Payer: Self-pay | Admitting: Cardiology

## 2016-09-30 ENCOUNTER — Ambulatory Visit (INDEPENDENT_AMBULATORY_CARE_PROVIDER_SITE_OTHER): Payer: Medicare PPO | Admitting: *Deleted

## 2016-09-30 DIAGNOSIS — I5022 Chronic systolic (congestive) heart failure: Secondary | ICD-10-CM

## 2016-09-30 DIAGNOSIS — I255 Ischemic cardiomyopathy: Secondary | ICD-10-CM | POA: Diagnosis not present

## 2016-09-30 DIAGNOSIS — Z95810 Presence of automatic (implantable) cardiac defibrillator: Secondary | ICD-10-CM

## 2016-09-30 NOTE — Telephone Encounter (Signed)
Spoke with pt and reminded pt of remote transmission that is due today. Pt verbalized understanding.   

## 2016-10-01 LAB — CUP PACEART REMOTE DEVICE CHECK
Battery Remaining Longevity: 50 mo
Brady Statistic AP VP Percent: 38.38 %
Brady Statistic AP VS Percent: 0.89 %
Brady Statistic AS VP Percent: 59.79 %
Brady Statistic AS VS Percent: 0.95 %
HighPow Impedance: 45 Ohm
HighPow Impedance: 53 Ohm
Implantable Lead Implant Date: 20110119
Implantable Lead Location: 753860
Implantable Lead Model: 6947
Lead Channel Impedance Value: 342 Ohm
Lead Channel Impedance Value: 418 Ohm
Lead Channel Impedance Value: 513 Ohm
Lead Channel Impedance Value: 703 Ohm
Lead Channel Impedance Value: 950 Ohm
Lead Channel Impedance Value: 988 Ohm
Lead Channel Pacing Threshold Amplitude: 0.5 V
Lead Channel Pacing Threshold Amplitude: 1.375 V
Lead Channel Pacing Threshold Pulse Width: 0.4 ms
Lead Channel Pacing Threshold Pulse Width: 0.8 ms
Lead Channel Sensing Intrinsic Amplitude: 2.25 mV
Lead Channel Sensing Intrinsic Amplitude: 31.625 mV
Lead Channel Setting Pacing Amplitude: 2 V
Lead Channel Setting Pacing Pulse Width: 0.8 ms
MDC IDC LEAD IMPLANT DT: 20110119
MDC IDC LEAD LOCATION: 753859
MDC IDC MSMT BATTERY VOLTAGE: 2.97 V
MDC IDC MSMT LEADCHNL LV IMPEDANCE VALUE: 475 Ohm
MDC IDC MSMT LEADCHNL LV IMPEDANCE VALUE: 532 Ohm
MDC IDC MSMT LEADCHNL LV IMPEDANCE VALUE: 646 Ohm
MDC IDC MSMT LEADCHNL LV IMPEDANCE VALUE: 722 Ohm
MDC IDC MSMT LEADCHNL LV IMPEDANCE VALUE: 874 Ohm
MDC IDC MSMT LEADCHNL RA SENSING INTR AMPL: 2.25 mV
MDC IDC MSMT LEADCHNL RV IMPEDANCE VALUE: 361 Ohm
MDC IDC MSMT LEADCHNL RV IMPEDANCE VALUE: 475 Ohm
MDC IDC MSMT LEADCHNL RV PACING THRESHOLD AMPLITUDE: 1 V
MDC IDC MSMT LEADCHNL RV PACING THRESHOLD PULSEWIDTH: 0.4 ms
MDC IDC MSMT LEADCHNL RV SENSING INTR AMPL: 31.625 mV
MDC IDC PG IMPLANT DT: 20150623
MDC IDC SESS DTM: 20180507174223
MDC IDC SET LEADCHNL LV PACING AMPLITUDE: 2.5 V
MDC IDC SET LEADCHNL RV PACING AMPLITUDE: 2.5 V
MDC IDC SET LEADCHNL RV PACING PULSEWIDTH: 0.4 ms
MDC IDC SET LEADCHNL RV SENSING SENSITIVITY: 0.3 mV
MDC IDC STAT BRADY RA PERCENT PACED: 39.16 %
MDC IDC STAT BRADY RV PERCENT PACED: 20.89 %

## 2016-10-01 NOTE — Progress Notes (Signed)
EPIC Encounter for ICM Monitoring  Patient Name: Tracy Mills is a 71 y.o. female Date: 10/01/2016 Primary Care Physican: [name], MD Primary [place] Electrophysiologist: Allred Dry Weight:135 lbs Bi-V Pacing: 94.5%       Heart Failure questions reviewed, pt asymptomatic.   Thoracic impedance at normal today but has been abnormal suggesting fluid accumulation since 09/17/2016.  Prescribed and confirmed dosage: Furosemide 40 mg 1 tablet daily  Labs:  07/11/2016 Creatinine 1.20, BUN 23, Potassium 4.1, Sodium 139, EGFR 46-53  Recommendations: No changes. Discussed to limit salt intake to 2000 mg/day and fluid intake to < 2 liters/day.  Encouraged to call for fluid symptoms or use local ER for any urgent symptoms.  Follow-up plan: ICM clinic phone appointment on 11/01/2016.  Office appointment scheduled on 10/22/2016 with Dr [name].  Copy of ICM check sent to primary cardiologist and device physician.   3 month ICM trend: 10/01/2016   1 Year ICM trend:      Tracy Billings, RN 10/01/2016 3:34 PM

## 2016-10-01 NOTE — Progress Notes (Signed)
Remote ICD transmission.   

## 2016-10-03 ENCOUNTER — Ambulatory Visit (INDEPENDENT_AMBULATORY_CARE_PROVIDER_SITE_OTHER): Payer: Medicare PPO

## 2016-10-03 ENCOUNTER — Other Ambulatory Visit: Payer: Self-pay

## 2016-10-03 DIAGNOSIS — R0602 Shortness of breath: Secondary | ICD-10-CM | POA: Diagnosis not present

## 2016-10-03 DIAGNOSIS — I5022 Chronic systolic (congestive) heart failure: Secondary | ICD-10-CM | POA: Diagnosis not present

## 2016-10-04 ENCOUNTER — Encounter: Payer: Self-pay | Admitting: Cardiology

## 2016-10-18 ENCOUNTER — Encounter: Payer: Self-pay | Admitting: *Deleted

## 2016-10-22 ENCOUNTER — Encounter: Payer: Self-pay | Admitting: Cardiovascular Disease

## 2016-10-22 ENCOUNTER — Ambulatory Visit (INDEPENDENT_AMBULATORY_CARE_PROVIDER_SITE_OTHER): Payer: Medicare PPO | Admitting: Cardiovascular Disease

## 2016-10-22 VITALS — BP 122/75 | HR 81 | Ht 63.0 in | Wt 129.6 lb

## 2016-10-22 DIAGNOSIS — Z95810 Presence of automatic (implantable) cardiac defibrillator: Secondary | ICD-10-CM

## 2016-10-22 DIAGNOSIS — E782 Mixed hyperlipidemia: Secondary | ICD-10-CM | POA: Diagnosis not present

## 2016-10-22 DIAGNOSIS — I472 Ventricular tachycardia, unspecified: Secondary | ICD-10-CM

## 2016-10-22 DIAGNOSIS — I519 Heart disease, unspecified: Secondary | ICD-10-CM

## 2016-10-22 DIAGNOSIS — I5022 Chronic systolic (congestive) heart failure: Secondary | ICD-10-CM | POA: Diagnosis not present

## 2016-10-22 DIAGNOSIS — I10 Essential (primary) hypertension: Secondary | ICD-10-CM

## 2016-10-22 DIAGNOSIS — I2510 Atherosclerotic heart disease of native coronary artery without angina pectoris: Secondary | ICD-10-CM

## 2016-10-22 NOTE — Patient Instructions (Addendum)

## 2016-10-22 NOTE — Progress Notes (Signed)
SUBJECTIVE: The patient is here to follow up for CAD and ischemic cardiomyopathy with chronic systolic heart failure. She underwent CRT-D upgrade on 11/16/13 by Dr. Johney Frame.  Echocardiogram performed earlier this month demonstrated severely reduced left ventricular systolic function, LVEF 25-30%, grade 1 diastolic dysfunction, apical akinesis, and moderate to severe mitral regurgitation.  Device interrogation 09/30/16: Normal device function with 5 beats of nonsustained ventricular tachycardia.  She denies chest pain, palpitations, leg edema, orthopnea, shortness of breath, and device shocks.  She has been battling a cold and has allergies. She has had a cough productive of white phlegm. She denies fevers.   Review of Systems: As per "subjective", otherwise negative.  Allergies  Allergen Reactions  . Penicillins Hives  . Tetracyclines & Related Other (See Comments)    "yeast infections"    Current Outpatient Prescriptions  Medication Sig Dispense Refill  . acetaminophen (TYLENOL) 500 MG tablet Take 500 mg by mouth daily.    Marland Kitchen aspirin 81 MG tablet Take 81 mg by mouth daily.      . carvedilol (COREG) 3.125 MG tablet Take 1 tablet (3.125 mg total) by mouth 2 (two) times daily. 180 tablet 3  . furosemide (LASIX) 40 MG tablet Take 1 tablet (40 mg total) by mouth daily. 90 tablet 3  . losartan (COZAAR) 50 MG tablet Take 1 tablet (50 mg total) by mouth daily. 90 tablet 3  . nitroGLYCERIN (NITROSTAT) 0.4 MG SL tablet Place 1 tablet (0.4 mg total) under the tongue every 5 (five) minutes as needed for chest pain. 25 tablet 3  . simvastatin (ZOCOR) 40 MG tablet Take 1 tablet (40 mg total) by mouth daily. 90 tablet 3  . spironolactone (ALDACTONE) 25 MG tablet Take 1 tablet (25 mg total) by mouth daily. 90 tablet 3   No current facility-administered medications for this visit.     Past Medical History:  Diagnosis Date  . Chronic systolic congestive heart failure (HCC)   . Coronary  artery disease    anterior MI in 2010. LAD stent placement 2010. EF: 15%  . Hypertension   . Ischemic cardiomyopathy   . LBBB (left bundle branch block)   . MR (mitral regurgitation)    moderate to severe  . Tobacco abuse   . VF (ventricular fibrillation) (HCC) 2013   nonsustained VF, spontaneously termianted    Past Surgical History:  Procedure Laterality Date  . BI-VENTRICULAR IMPLANTABLE CARDIOVERTER DEFIBRILLATOR UPGRADE  11-16-2013   upgrade of previously implanted dual chamber ICD to MDT Ovidio Kin XT CRTD by Dr Johney Frame  . BI-VENTRICULAR IMPLANTABLE CARDIOVERTER DEFIBRILLATOR UPGRADE N/A 11/16/2013   Procedure: BI-VENTRICULAR IMPLANTABLE CARDIOVERTER DEFIBRILLATOR UPGRADE;  Surgeon: Gardiner Rhyme, MD;  Location: [facility] CATH LAB;  Service: Cardiovascular;  Laterality: N/A;  . CARDIAC CATHETERIZATION  2010   LAD PCI and stent placement  . CARDIAC DEFIBRILLATOR PLACEMENT  06/14/09   MDT [facility] Dr Johney Frame  . TONSILLECTOMY AND ADENOIDECTOMY    . TUBAL LIGATION      Social History   Social History  . Marital status: Single    Spouse name: N/A  . Number of children: N/A  . Years of education: N/A   Occupational History  . RETIRED     retired bus Office manager office   Social History Main Topics  . Smoking status: Current Some Day Smoker    Packs/day: 0.20    Years: 45.00    Types: Cigarettes    Start date: 04/07/1966  . Smokeless  tobacco: Never Used     Comment: smokes socially/ smokes 2 cigarettes per month, trying to quit- quit 6 months ago 03/07/15  . Alcohol use 0.0 oz/week     Comment: occasional alcohol use  . Drug use: No  . Sexual activity: No   Other Topics Concern  . Not on file   Social History Narrative   Moved to [place] from [place].  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 Neck: No JVD, no thyromegaly. Lungs: Expiratory wheezes b/l. CV: Nondisplaced PMI.  Regular rate and rhythm, normal S1/S2, no S3/S4, no murmur. No pretibial or periankle edema.  No carotid bruit.   Abdomen: Soft, nontender, no distention.  Neurologic: Alert and oriented.  Psych: Normal affect. Skin: Normal. Musculoskeletal: No gross deformities.    ECG: Most recent ECG reviewed.   Labs: Lab Results  Component Value Date/Time   K 4.5 11/29/2013 04:36 PM   BUN 25 (H) 11/29/2013 04:36 PM   CREATININE 1.5 (H) 11/29/2013 04:36 PM   HGB 14.1 11/16/2013 08:50 AM     Lipids: No results found for: LDLCALC, LDLDIRECT, CHOL, TRIG, HDL     ASSESSMENT AND PLAN: 1. Coronary artery disease with prior LAD PCI: Symptomatically stable. Continue aspirin, carvedilol, and simvastatin.  2. Chronic systolic heart failure: Euvolemic. Continue Lasix 40 mg daily and spironolactone 25 mg daily. Continue carvedilol and losartan. Left ventricular systolic function is severely reduced as detailed above, LVEF 25-30%. She has been scheduled in the device optimization clinic.  3. Ventricular tachycardia s/p ICD: Stable. Device interrogation 09/30/16-Normal device function with 5 beats of nonsustained ventricular tachycardia. Continue Coreg.  4. Hypertension: Controlled. No changes to therapy.  5. Hyperlipidemia: Continue simvastatin.    Disposition: Follow up 1 year with me. Will follow up with Dr. Johney Frame and Gypsy Balsam NP prior to that.  Prentice Docker, M.D., F.A.C.C.

## 2016-11-01 ENCOUNTER — Ambulatory Visit (INDEPENDENT_AMBULATORY_CARE_PROVIDER_SITE_OTHER): Payer: Medicare PPO

## 2016-11-01 ENCOUNTER — Telehealth: Payer: Self-pay

## 2016-11-01 DIAGNOSIS — Z95810 Presence of automatic (implantable) cardiac defibrillator: Secondary | ICD-10-CM

## 2016-11-01 DIAGNOSIS — I5022 Chronic systolic (congestive) heart failure: Secondary | ICD-10-CM | POA: Diagnosis not present

## 2016-11-01 NOTE — Telephone Encounter (Signed)
Attempted ICM call to patient.  Sister, Berline Lopes, ([place]) answered phone and stated she was gone to her granddaughter's graduation this weekend.  Requested she give message to patient to send remote transmission when she returns home.

## 2016-11-04 NOTE — Progress Notes (Signed)
EPIC Encounter for ICM Monitoring  Patient Name: Tracy Mills is a 70 y.o. female Date: 11/04/2016 Primary Care Physican: [name], MD Primary [place] Electrophysiologist: Allred Dry Weight: 135 lbs Bi-V Pacing: 94.5%            Heart Failure questions reviewed, pt asymptomatic.   Thoracic impedance normal until today but she said she was out of town and ate differently than she normally does.  Prescribed and confirmed dosage: Furosemide 40 mg 1 tablet daily  Labs:  07/11/2016 Creatinine 1.20, BUN 23, Potassium 4.1, Sodium 139, EGFR 46-53  Recommendations: No changes.  Advised to limit salt intake to 2000 mg/day and fluid intake to < 2 liters/day.  Encouraged to call for fluid symptoms.  Follow-up plan: ICM clinic phone appointment on 12/30/2016.  Office appointment scheduled on 12/05/2016 with Chanetta Marshall, NP and echo scheduled for same day.  She said she had an echo completed in May and asked if she needed to have the one scheduled in July.  Advised would ask Amber about July echo and call her back.     Copy of ICM check sent to device physician.   3 month ICM trend: 11/04/2016   1 Year ICM trend:      Rosalene Billings, RN 11/04/2016 2:03 PM

## 2016-11-05 NOTE — Progress Notes (Signed)
Call back to patient and advised the Amber will be making programming changes to see what is the best for her and echo will be used to make the changes.  The goal is to improve EF.Tracy Mills She stated she understood and she will be at the echo as scheduled.

## 2016-11-19 ENCOUNTER — Other Ambulatory Visit: Payer: Self-pay | Admitting: Cardiovascular Disease

## 2016-12-02 ENCOUNTER — Other Ambulatory Visit: Payer: Self-pay | Admitting: Nurse Practitioner

## 2016-12-02 DIAGNOSIS — I5022 Chronic systolic (congestive) heart failure: Secondary | ICD-10-CM

## 2016-12-03 NOTE — Progress Notes (Signed)
    AV Optimization Echo Note Date: 12/05/2016  ID:  Tracy Mills, DOB 1946/03/07, MRN [id number]  PCP: Ignatius Specking, MD Primary Cardiologist: Purvis Sheffield Electrophysiologist: Nyra Market is a 71 y.o. female is seen today for Dr Johney Frame.  She presents today for AV optimization of CRT.  Since last being seen in our clinic, the patient reports doing reasonably well.    The patient was atrially paced on arrival.  Device was programmed with a  PAV of 160 and SAV of 100 on presentation with adaptive CRT programmed on.  Paced AV delays were evaluated from 100 msec to 180 msec.  Optimal separation of E/A waves was noted at 110 msec.    Device was reprogrammed today to a SAV of and a PAV of .    Follow up with [facility], Dr Johney Frame as scheduled, consider repeat echo 6 months.   Signed, Gypsy Balsam, NP  12/05/2016 11:34 AM     [facility] [address] [place] [address] [phone] (office) [phone] (fax

## 2016-12-05 ENCOUNTER — Other Ambulatory Visit: Payer: Self-pay

## 2016-12-05 ENCOUNTER — Ambulatory Visit (INDEPENDENT_AMBULATORY_CARE_PROVIDER_SITE_OTHER): Payer: Medicare PPO | Admitting: Nurse Practitioner

## 2016-12-05 ENCOUNTER — Encounter (INDEPENDENT_AMBULATORY_CARE_PROVIDER_SITE_OTHER): Payer: Self-pay

## 2016-12-05 ENCOUNTER — Ambulatory Visit (HOSPITAL_COMMUNITY): Payer: Medicare PPO | Attending: Cardiology

## 2016-12-05 DIAGNOSIS — I313 Pericardial effusion (noninflammatory): Secondary | ICD-10-CM | POA: Insufficient documentation

## 2016-12-05 DIAGNOSIS — I5022 Chronic systolic (congestive) heart failure: Secondary | ICD-10-CM | POA: Insufficient documentation

## 2016-12-05 DIAGNOSIS — I253 Aneurysm of heart: Secondary | ICD-10-CM | POA: Insufficient documentation

## 2016-12-05 LAB — CUP PACEART INCLINIC DEVICE CHECK
Implantable Lead Implant Date: 20150623
Implantable Lead Location: 753858
Implantable Lead Location: 753860
Implantable Lead Model: 4598
Implantable Lead Model: 5076
Implantable Lead Model: 6947
MDC IDC LEAD IMPLANT DT: 20110119
MDC IDC LEAD IMPLANT DT: 20110119
MDC IDC LEAD LOCATION: 753859
MDC IDC PG IMPLANT DT: 20150623
MDC IDC SESS DTM: 20180712113708

## 2016-12-30 ENCOUNTER — Encounter: Payer: Medicare PPO | Admitting: *Deleted

## 2017-01-01 ENCOUNTER — Encounter: Payer: Self-pay | Admitting: Cardiology

## 2017-01-06 NOTE — Progress Notes (Signed)
No ICM remote transmission received for 12/30/2016 and next ICM transmission scheduled for 01/23/2017.

## 2017-01-23 ENCOUNTER — Ambulatory Visit (INDEPENDENT_AMBULATORY_CARE_PROVIDER_SITE_OTHER): Payer: Medicare PPO

## 2017-01-23 ENCOUNTER — Telehealth: Payer: Self-pay

## 2017-01-23 DIAGNOSIS — Z95810 Presence of automatic (implantable) cardiac defibrillator: Secondary | ICD-10-CM

## 2017-01-23 DIAGNOSIS — I5022 Chronic systolic (congestive) heart failure: Secondary | ICD-10-CM | POA: Diagnosis not present

## 2017-01-23 NOTE — Telephone Encounter (Signed)
Remote ICM transmission received.  Attempted call to patient and left detailed message regarding transmission and next ICM scheduled for 03/06/2017.  Advised to return call for any fluid symptoms or questions.    

## 2017-01-23 NOTE — Progress Notes (Signed)
EPIC Encounter for ICM Monitoring  Patient Name: Tracy Mills is a 71 y.o. female Date: 01/23/2017 Primary Care Physican: [name], MD Primary [place] Electrophysiologist: Allred Dry Weight: Previous ICM weight 135 lbs Bi-V Pacing: 95.2%       Attempted call to patient and unable to reach.  Left detailed message regarding transmission.  Transmission reviewed.    Thoracic impedance normal.  Prescribed dosage: Furosemide 40 mg 1 tablet daily  Labs:  07/11/2016 Creatinine 1.20, BUN 23, Potassium 4.1, Sodium 139, EGFR 46-53  Recommendations: Left voice mail with ICM number and encouraged to call for fluid symptoms.  Follow-up plan: ICM clinic phone appointment on 03/06/2017.  Office appointment scheduled 04/04/2017 with Dr. [name].  Copy of ICM check sent to Dr. [name].   3 month ICM trend: 01/23/2017   1 Year ICM trend:      Rosalene Billings, RN 01/23/2017 10:49 AM

## 2017-02-11 ENCOUNTER — Other Ambulatory Visit: Payer: Self-pay | Admitting: *Deleted

## 2017-02-11 DIAGNOSIS — I5022 Chronic systolic (congestive) heart failure: Secondary | ICD-10-CM

## 2017-02-11 MED ORDER — FUROSEMIDE 40 MG PO TABS: 40.0000 mg | ORAL_TABLET | Freq: Every day | ORAL | 3 refills | Status: DC

## 2017-02-11 MED ORDER — SPIRONOLACTONE 25 MG PO TABS: 25.0000 mg | ORAL_TABLET | Freq: Every day | ORAL | 3 refills | Status: DC

## 2017-02-11 MED ORDER — CARVEDILOL 3.125 MG PO TABS: 3.1250 mg | ORAL_TABLET | Freq: Two times a day (BID) | ORAL | 3 refills | Status: DC

## 2017-03-06 ENCOUNTER — Telehealth: Payer: Self-pay

## 2017-03-06 ENCOUNTER — Ambulatory Visit (INDEPENDENT_AMBULATORY_CARE_PROVIDER_SITE_OTHER): Payer: Medicare PPO

## 2017-03-06 DIAGNOSIS — I5022 Chronic systolic (congestive) heart failure: Secondary | ICD-10-CM | POA: Diagnosis not present

## 2017-03-06 DIAGNOSIS — Z95810 Presence of automatic (implantable) cardiac defibrillator: Secondary | ICD-10-CM | POA: Diagnosis not present

## 2017-03-06 NOTE — Telephone Encounter (Signed)
Remote ICM transmission received.  Attempted call to patient on home and cell phone and left message on cell to return call.  Home phone line was busy.

## 2017-03-06 NOTE — Progress Notes (Signed)
EPIC Encounter for ICM Monitoring  Patient Name: NOOR WITTE is a 71 y.o. female Date: 03/06/2017 Primary Care Physican: [name], MD Primary [place] Electrophysiologist: Allred Dry Weight: Previous ICM weight 135 lbs Bi-V Pacing: 99.3%        Attempted call to patient and unable to reach.    Transmission reviewed.    Thoracic impedance normal but was abnormal suggesting fluid accumulation from 02/02/2017 to 02/25/2017.  Prescribed dosage: Furosemide 40 mg 1 tablet daily  Labs:  07/11/2016 Creatinine 1.20, BUN 23, Potassium 4.1, Sodium 139, EGFR 46-53  Recommendations:   NONE - Unable to reach patient   Follow-up plan: ICM clinic phone appointment on 05/05/2017.  Office appointment scheduled 04/04/2017 with Dr. [name].  Copy of ICM check sent to Dr. [name].   3 month ICM trend: 03/06/2017   1 Year ICM trend:      Rosalene Billings, RN 03/06/2017 12:46 PM

## 2017-03-07 NOTE — Progress Notes (Signed)
Late entry from 03/06/2017 and call back today.  Patient reported being hospitalized on 10/1 for leg cellulitis.  She stated it is better now and she is follow up with her PCP.  She stated weight this week was 132 lbs.  She denied any fluid symptoms today.  Next remote transmission 05/05/2017 since she has office defib check with Dr Johney Frame 11/9.

## 2017-03-28 ENCOUNTER — Encounter: Payer: Medicare HMO | Admitting: Internal Medicine

## 2017-04-04 ENCOUNTER — Encounter: Payer: Self-pay | Admitting: Internal Medicine

## 2017-04-04 ENCOUNTER — Ambulatory Visit (INDEPENDENT_AMBULATORY_CARE_PROVIDER_SITE_OTHER): Payer: Medicare PPO | Admitting: Internal Medicine

## 2017-04-04 ENCOUNTER — Other Ambulatory Visit: Payer: Self-pay

## 2017-04-04 VITALS — BP 122/70 | HR 82 | Ht 63.0 in | Wt 136.0 lb

## 2017-04-04 DIAGNOSIS — Z95810 Presence of automatic (implantable) cardiac defibrillator: Secondary | ICD-10-CM

## 2017-04-04 DIAGNOSIS — I10 Essential (primary) hypertension: Secondary | ICD-10-CM | POA: Diagnosis not present

## 2017-04-04 DIAGNOSIS — I255 Ischemic cardiomyopathy: Secondary | ICD-10-CM | POA: Diagnosis not present

## 2017-04-04 DIAGNOSIS — I5022 Chronic systolic (congestive) heart failure: Secondary | ICD-10-CM

## 2017-04-04 MED ORDER — NITROGLYCERIN 0.4 MG SL SUBL: 0.4000 mg | SUBLINGUAL_TABLET | SUBLINGUAL | 3 refills | Status: DC | PRN

## 2017-04-04 NOTE — Progress Notes (Signed)
PCP: Ignatius SpeckingVyas, Dhruv B, MD Primary Cardiologist:  Dr Purvis SheffieldKoneswaran Primary EP: Dr Lisbeth RenshawAllred  Tracy Mills is a 71 y.o. female who presents today for routine electrophysiology followup.  Since last being seen in our clinic, the patient reports doing reasonably well.  Several months ago, [facility] for R leg "bug bite" and skin infection for 4 days.  She says that she did not have bacteremia.  She denies fevers.  Feels that her infection resolved.   Today, she denies symptoms of palpitations, chest pain, lower extremity edema, dizziness, presyncope, syncope, or ICD shocks. SOB is stable.  She continues to smoke. The patient is otherwise without complaint today.   Past Medical History:  Diagnosis Date  . Chronic systolic congestive heart failure (HCC)   . Coronary artery disease    anterior MI in 2010. LAD stent placement 2010. EF: 15%  . Hypertension   . Ischemic cardiomyopathy   . LBBB (left bundle branch block)   . MR (mitral regurgitation)    moderate to severe  . Tobacco abuse   . VF (ventricular fibrillation) (HCC) 2013   nonsustained VF, spontaneously termianted   Past Surgical History:  Procedure Laterality Date  . BI-VENTRICULAR IMPLANTABLE CARDIOVERTER DEFIBRILLATOR UPGRADE  11-16-2013   upgrade of previously implanted dual chamber ICD to MDT Ovidio KinViva Quad XT CRTD by Dr Johney FrameAllred  . CARDIAC CATHETERIZATION  2010   LAD PCI and stent placement  . CARDIAC DEFIBRILLATOR PLACEMENT  06/14/09   MDT [facility] Dr Johney FrameAllred  . TONSILLECTOMY AND ADENOIDECTOMY    . TUBAL LIGATION      ROS- all systems are reviewed and negative except as per HPI above  Current Outpatient Medications  Medication Sig Dispense Refill  . acetaminophen (TYLENOL) 500 MG tablet Take 500 mg by mouth daily.    Marland Kitchen. aspirin 81 MG tablet Take 81 mg by mouth daily.      . carvedilol (COREG) 3.125 MG tablet Take 1 tablet (3.125 mg total) by mouth 2 (two) times daily. 180 tablet 3  . furosemide (LASIX) 40 MG  tablet Take 1 tablet (40 mg total) by mouth daily. 90 tablet 3  . losartan (COZAAR) 50 MG tablet TAKE 1 TABLET EVERY DAY 90 tablet 3  . nitroGLYCERIN (NITROSTAT) 0.4 MG SL tablet Place 1 tablet (0.4 mg total) under the tongue every 5 (five) minutes as needed for chest pain. 25 tablet 3  . simvastatin (ZOCOR) 40 MG tablet TAKE 1 TABLET EVERY DAY 90 tablet 3  . spironolactone (ALDACTONE) 25 MG tablet Take 1 tablet (25 mg total) by mouth daily. 90 tablet 3   No current facility-administered medications for this visit.     Physical Exam: Vitals:   04/04/17 1022  BP: 122/70  Pulse: 82  SpO2: 97%  Weight: 61.7 kg (136 lb)  Height: 5\' 3"  (1.6 m)    GEN- The patient is well appearing, alert and oriented x 3 today.   Head- normocephalic, atraumatic Eyes-  Sclera clear, conjunctiva pink Ears- hearing intact Oropharynx- clear Lungs- Clear to ausculation bilaterally, normal work of breathing Chest- ICD pocket is well healed Heart- Regular rate and rhythm, no murmurs, rubs or gallops, PMI not laterally displaced GI- soft, NT, ND, + BS Extremities- no clubbing, cyanosis, or edema  ICD interrogation- reviewed in detail today,  See PACEART report  ekg tracing 5/18 is personally reviewed today  Assessment and Plan:  1.  Chronic systolic dysfunction/ ischemic CM euvolemic today Stable on an appropriate medical  regimen Normal ICD function See Arita Miss Art report No changes today Followed in ICM device clinic Device optimization performed in July Would repeat echo in 3 months to assess response to device optimization Could consider entresto if symptoms persist  2. VT NSVT only since last interrogation Normal ICD function  3. Tobacco Cessation advised  4. HTN Stable No change required today   [facility]  Return to see me in 1 year Dr Purvis Sheffield to see as scheduled  Hillis Range MD, [facility] 04/04/2017 10:57 AM

## 2017-04-04 NOTE — Patient Instructions (Signed)
Medication Instructions:  Continue all current medications.  Labwork: none  Testing/Procedures: Your physician has requested that you have an echocardiogram. Echocardiography is a painless test that uses sound waves to create images of your heart. It provides your doctor with information about the size and shape of your heart and how well your heart's chambers and valves are working. This procedure takes approximately one hour. There are no restrictions for this procedure.  (DUE IN 3 MONTHS).  Follow-Up: Your physician wants you to follow up in:  1 year.  You will receive a reminder letter in the mail one-two months in advance.  If you don't receive a letter, please call our office to schedule the follow up appointment   Any Other Special Instructions Will Be Listed Below (If Applicable). Remote monitoring is used to monitor your Pacemaker of ICD from home. This monitoring reduces the number of office visits required to check your device to one time per year. It allows [place] to keep an eye on the functioning of your device to ensure it is working properly. You are scheduled for a device check from home on 07-07-2017. You may send your transmission at any time that day. If you have a wireless device, the transmission will be sent automatically. After your physician reviews your transmission, you will receive a postcard with your next transmission date.  If you need a refill on your cardiac medications before your next appointment, please call your pharmacy.

## 2017-04-11 LAB — CUP PACEART INCLINIC DEVICE CHECK
Battery Remaining Longevity: 24 mo
Brady Statistic AP VP Percent: 61.9 %
Implantable Lead Implant Date: 20110119
Implantable Lead Implant Date: 20150623
Implantable Lead Location: 753859
Implantable Lead Location: 753860
Implantable Lead Model: 4598
Implantable Lead Model: 6947
Implantable Pulse Generator Implant Date: 20150623
Lead Channel Impedance Value: 779 Ohm
Lead Channel Pacing Threshold Amplitude: 0.5 V
Lead Channel Pacing Threshold Amplitude: 2 V
Lead Channel Pacing Threshold Pulse Width: 0.4 ms
Lead Channel Pacing Threshold Pulse Width: 0.8 ms
Lead Channel Sensing Intrinsic Amplitude: 2 mV
Lead Channel Setting Pacing Amplitude: 2 V
Lead Channel Setting Pacing Amplitude: 2.5 V
Lead Channel Setting Pacing Amplitude: 3.5 V
Lead Channel Setting Pacing Pulse Width: 0.4 ms
MDC IDC LEAD IMPLANT DT: 20110119
MDC IDC LEAD LOCATION: 753858
MDC IDC MSMT LEADCHNL RA IMPEDANCE VALUE: 399 Ohm
MDC IDC MSMT LEADCHNL RV IMPEDANCE VALUE: 418 Ohm
MDC IDC MSMT LEADCHNL RV PACING THRESHOLD AMPLITUDE: 1 V
MDC IDC MSMT LEADCHNL RV PACING THRESHOLD PULSEWIDTH: 0.4 ms
MDC IDC MSMT LEADCHNL RV SENSING INTR AMPL: 20 mV
MDC IDC SESS DTM: 20181116142647
MDC IDC SET LEADCHNL LV PACING PULSEWIDTH: 0.8 ms
MDC IDC SET LEADCHNL RV SENSING SENSITIVITY: 0.3 mV
MDC IDC STAT BRADY AP VS PERCENT: 0.1 % — AB
MDC IDC STAT BRADY AS VP PERCENT: 37.9 %
MDC IDC STAT BRADY AS VS PERCENT: 0.1 %

## 2017-05-05 ENCOUNTER — Ambulatory Visit (INDEPENDENT_AMBULATORY_CARE_PROVIDER_SITE_OTHER): Payer: Medicare PPO

## 2017-05-05 DIAGNOSIS — I5022 Chronic systolic (congestive) heart failure: Secondary | ICD-10-CM | POA: Diagnosis not present

## 2017-05-05 DIAGNOSIS — Z95810 Presence of automatic (implantable) cardiac defibrillator: Secondary | ICD-10-CM | POA: Diagnosis not present

## 2017-05-09 NOTE — Progress Notes (Signed)
EPIC Encounter for ICM Monitoring  Patient Name: Tracy Mills is a 70 y.o. female Date: 05/09/2017 Primary Care Physican: [name], MD Primary [place] Electrophysiologist: Allred Dry Weight: Previous ICM weight 132 lbs Bi-V Pacing: 95.7%             Transmission reviewed   Thoracic impedance normal.  Prescribed dosage: Furosemide 40 mg 1 tablet daily  Labs:  07/11/2016 Creatinine 1.20, BUN 23, Potassium 4.1, Sodium 139, EGFR 46-53  Recommendations: No changes.   Follow-up plan: ICM clinic phone appointment on 06/05/2017.    Copy of ICM check sent to Dr. [name].   3 month ICM trend: 05/05/2017    1 Year ICM trend:       Rosalene Billings, RN 05/09/2017 5:31 PM

## 2017-06-05 ENCOUNTER — Ambulatory Visit: Payer: Medicare PPO

## 2017-06-05 DIAGNOSIS — Z95810 Presence of automatic (implantable) cardiac defibrillator: Secondary | ICD-10-CM

## 2017-06-05 DIAGNOSIS — I5022 Chronic systolic (congestive) heart failure: Secondary | ICD-10-CM

## 2017-06-05 NOTE — Progress Notes (Signed)
EPIC Encounter for ICM Monitoring  Patient Name: Tracy Mills is a 72 y.o. female Date: 06/05/2017 Primary Care Physican: [name], MD Primary [place] Electrophysiologist: Allred Dry Weight: Previous ICM weight 132 lbs Bi-V Pacing: 94.7%       Attempted call to patient and unable to reach.   Transmission reviewed.    Thoracic impedance abnormal suggesting fluid accumulation since 05/30/17.  Prescribed dosage: Furosemide 40 mg 1 tablet daily  Labs:  07/11/2016 Creatinine 1.20, BUN 23, Potassium 4.1, Sodium 139, EGFR 46-53  Recommendations: NONE - Unable to reach.  Follow-up plan: ICM clinic phone appointment on 06/17/2017.    Copy of ICM check sent to Dr. [name] and Dr. [name].  3 month ICM trend: 06/05/2017    1 Year ICM trend:       Rosalene Billings, RN 06/05/2017 11:18 AM

## 2017-06-06 ENCOUNTER — Telehealth: Payer: Self-pay

## 2017-06-06 NOTE — Telephone Encounter (Signed)
Remote ICM transmission received.  Attempted call to patient and no answer   

## 2017-06-06 NOTE — Progress Notes (Signed)
Increase Lasix to 40 mg bid x 3 days and have her call back to report if there has been any symptom improvement.

## 2017-06-09 NOTE — Progress Notes (Signed)
Call to patient to advise of Dr Junius Argyle recommendations.  Patient said Dr Junius Argyle office just called her this morning and gave instructions to increase Furosemide.  She is feeling fine but weight is slightly up to 136 lbs.  Advise will recheck fluid levels on 06/17/2017.

## 2017-06-09 NOTE — Progress Notes (Signed)
Pt aware and voiced understanding - will call back in a few days with an update.

## 2017-06-17 ENCOUNTER — Telehealth: Payer: Self-pay | Admitting: Cardiology

## 2017-06-17 NOTE — Telephone Encounter (Signed)
LMOVM reminding pt to send remote transmission.   

## 2017-06-26 NOTE — Progress Notes (Signed)
No ICM remote transmission received for 06/17/2017 and next ICM transmission scheduled for 07/07/2017.

## 2017-07-07 ENCOUNTER — Ambulatory Visit (INDEPENDENT_AMBULATORY_CARE_PROVIDER_SITE_OTHER): Payer: Medicare PPO | Admitting: *Deleted

## 2017-07-07 DIAGNOSIS — Z95810 Presence of automatic (implantable) cardiac defibrillator: Secondary | ICD-10-CM

## 2017-07-07 DIAGNOSIS — I255 Ischemic cardiomyopathy: Secondary | ICD-10-CM | POA: Diagnosis not present

## 2017-07-07 DIAGNOSIS — I5022 Chronic systolic (congestive) heart failure: Secondary | ICD-10-CM | POA: Diagnosis not present

## 2017-07-08 NOTE — Progress Notes (Signed)
EPIC Encounter for ICM Monitoring  Patient Name: Tracy Mills is a 72 y.o. female Date: 07/08/2017 Primary Care Physican: [name], MD Primary [place] Electrophysiologist: Allred Dry Weight: Previous ICM weight 132lbs Bi-V Pacing: 95.6%      Heart Failure questions reviewed, pt asymptomatic.   Thoracic impedance normal.  Prescribed dosage: Furosemide 40 mg 1 tablet daily  Labs:  07/11/2016 Creatinine 1.20, BUN 23, Potassium 4.1, Sodium 139, EGFR 46-53  Recommendations: No changes.  Encouraged to call for fluid symptoms.  Follow-up plan: ICM clinic phone appointment on 08/08/2017.    Copy of ICM check sent to Dr. [name].   3 month ICM trend: 07/08/2017    1 Year ICM trend:       Tracy Billings, RN 07/08/2017 3:21 PM

## 2017-07-08 NOTE — Progress Notes (Signed)
Remote ICD transmission.   

## 2017-07-09 ENCOUNTER — Other Ambulatory Visit: Payer: Self-pay

## 2017-07-09 ENCOUNTER — Ambulatory Visit (INDEPENDENT_AMBULATORY_CARE_PROVIDER_SITE_OTHER): Payer: Medicare PPO

## 2017-07-09 ENCOUNTER — Encounter: Payer: Self-pay | Admitting: Cardiology

## 2017-07-09 DIAGNOSIS — I5022 Chronic systolic (congestive) heart failure: Secondary | ICD-10-CM

## 2017-07-16 LAB — CUP PACEART REMOTE DEVICE CHECK
Battery Remaining Longevity: 22 mo
Battery Voltage: 2.94 V
Brady Statistic AS VS Percent: 0.21 %
Brady Statistic RA Percent Paced: 48.84 %
Brady Statistic RV Percent Paced: 95.59 %
HIGH POWER IMPEDANCE MEASURED VALUE: 46 Ohm
HIGH POWER IMPEDANCE MEASURED VALUE: 59 Ohm
Implantable Lead Implant Date: 20110119
Implantable Lead Implant Date: 20150623
Implantable Lead Location: 753858
Implantable Lead Location: 753859
Implantable Lead Location: 753860
Implantable Lead Model: 4598
Implantable Lead Model: 5076
Implantable Lead Model: 6947
Lead Channel Impedance Value: 1026 Ohm
Lead Channel Impedance Value: 1083 Ohm
Lead Channel Impedance Value: 418 Ohm
Lead Channel Impedance Value: 456 Ohm
Lead Channel Impedance Value: 456 Ohm
Lead Channel Impedance Value: 646 Ohm
Lead Channel Impedance Value: 703 Ohm
Lead Channel Impedance Value: 779 Ohm
Lead Channel Pacing Threshold Amplitude: 0.5 V
Lead Channel Pacing Threshold Amplitude: 1.125 V
Lead Channel Pacing Threshold Pulse Width: 0.4 ms
Lead Channel Pacing Threshold Pulse Width: 0.4 ms
Lead Channel Sensing Intrinsic Amplitude: 1.625 mV
Lead Channel Sensing Intrinsic Amplitude: 27.25 mV
Lead Channel Setting Pacing Amplitude: 2.5 V
Lead Channel Setting Pacing Amplitude: 4 V
Lead Channel Setting Pacing Pulse Width: 0.4 ms
Lead Channel Setting Sensing Sensitivity: 0.3 mV
MDC IDC LEAD IMPLANT DT: 20110119
MDC IDC MSMT LEADCHNL LV IMPEDANCE VALUE: 1007 Ohm
MDC IDC MSMT LEADCHNL LV IMPEDANCE VALUE: 513 Ohm
MDC IDC MSMT LEADCHNL LV IMPEDANCE VALUE: 532 Ohm
MDC IDC MSMT LEADCHNL LV IMPEDANCE VALUE: 836 Ohm
MDC IDC MSMT LEADCHNL LV PACING THRESHOLD AMPLITUDE: 3 V
MDC IDC MSMT LEADCHNL LV PACING THRESHOLD PULSEWIDTH: 0.8 ms
MDC IDC MSMT LEADCHNL RA SENSING INTR AMPL: 1.625 mV
MDC IDC MSMT LEADCHNL RV IMPEDANCE VALUE: 513 Ohm
MDC IDC MSMT LEADCHNL RV SENSING INTR AMPL: 27.25 mV
MDC IDC PG IMPLANT DT: 20150623
MDC IDC SESS DTM: 20190211051707
MDC IDC SET LEADCHNL LV PACING PULSEWIDTH: 0.8 ms
MDC IDC SET LEADCHNL RA PACING AMPLITUDE: 2 V
MDC IDC STAT BRADY AP VP PERCENT: 48.96 %
MDC IDC STAT BRADY AP VS PERCENT: 0.05 %
MDC IDC STAT BRADY AS VP PERCENT: 50.78 %

## 2017-08-08 ENCOUNTER — Ambulatory Visit (INDEPENDENT_AMBULATORY_CARE_PROVIDER_SITE_OTHER): Payer: Medicare PPO

## 2017-08-08 DIAGNOSIS — Z95810 Presence of automatic (implantable) cardiac defibrillator: Secondary | ICD-10-CM | POA: Diagnosis not present

## 2017-08-08 DIAGNOSIS — I5022 Chronic systolic (congestive) heart failure: Secondary | ICD-10-CM

## 2017-08-08 NOTE — Progress Notes (Signed)
EPIC Encounter for ICM Monitoring  Patient Name: Tracy Mills is a 72 y.o. female Date: 08/08/2017 Primary Care Physican: [name], MD Primary [place] Electrophysiologist: Allred Dry Weight: Previous ICM weight 132lbs Bi-V Pacing: 97%      Heart Failure questions reviewed, pt asymptomatic.   Thoracic impedance normal.  Prescribed dosage: Furosemide 40 mg 1 tablet daily  Labs:  07/11/2016 Creatinine 1.20, BUN 23, Potassium 4.1, Sodium 139, EGFR 46-53  Recommendations: No changes.   Encouraged to call for fluid symptoms.  Follow-up plan: ICM clinic phone appointment on 09/08/2017.  Office appointment scheduled 08/12/2017 with Dr. [name].  Copy of ICM check sent to Dr. [name] and Dr. [name] (office appt 3/19).   3 month ICM trend: 08/08/2017    1 Year ICM trend:       Rosalene Billings, RN 08/08/2017 11:42 AM

## 2017-08-12 ENCOUNTER — Other Ambulatory Visit: Payer: Self-pay

## 2017-08-12 ENCOUNTER — Ambulatory Visit: Payer: Medicare PPO | Admitting: Cardiovascular Disease

## 2017-08-12 ENCOUNTER — Encounter: Payer: Self-pay | Admitting: Cardiovascular Disease

## 2017-08-12 VITALS — BP 131/76 | HR 82 | Ht 63.0 in | Wt 139.0 lb

## 2017-08-12 DIAGNOSIS — E785 Hyperlipidemia, unspecified: Secondary | ICD-10-CM

## 2017-08-12 DIAGNOSIS — I25118 Atherosclerotic heart disease of native coronary artery with other forms of angina pectoris: Secondary | ICD-10-CM

## 2017-08-12 DIAGNOSIS — I519 Heart disease, unspecified: Secondary | ICD-10-CM | POA: Diagnosis not present

## 2017-08-12 DIAGNOSIS — Z95810 Presence of automatic (implantable) cardiac defibrillator: Secondary | ICD-10-CM

## 2017-08-12 DIAGNOSIS — I5042 Chronic combined systolic (congestive) and diastolic (congestive) heart failure: Secondary | ICD-10-CM | POA: Diagnosis not present

## 2017-08-12 DIAGNOSIS — I472 Ventricular tachycardia, unspecified: Secondary | ICD-10-CM

## 2017-08-12 DIAGNOSIS — I10 Essential (primary) hypertension: Secondary | ICD-10-CM

## 2017-08-12 MED ORDER — ROSUVASTATIN CALCIUM 20 MG PO TABS: 20.0000 mg | ORAL_TABLET | Freq: Every day | ORAL | 6 refills | Status: DC

## 2017-08-12 MED ORDER — SACUBITRIL-VALSARTAN 24-26 MG PO TABS: 1.0000 | ORAL_TABLET | Freq: Two times a day (BID) | ORAL | 0 refills | Status: DC

## 2017-08-12 NOTE — Progress Notes (Signed)
SUBJECTIVE: The patient presents for routine follow-up.  Thoracic impedance was normal on 08/08/17. She has a history of CAD and ischemic cardiomyopathy with chronic systolic heart failure. She underwent CRT-D upgrade on 11/16/13 by Dr. Johney Frame.  Echocardiogram 07/09/17 showed severely reduced left ventricular systolic function, LVEF 25-30%.  There were multiple wall motion abnormalities.  There is no definitive mural thrombus.  There was grade 2 diastolic dysfunction and moderate mitral regurgitation.  There is a small pericardial effusion anteriorly.  She denies chest pain, palpitations, shortness of breath, orthopnea, leg swelling, syncope, and paroxysmal nocturnal dyspnea.  She has not had any ICD shocks.  She later in the conversation told me she stopped taking simvastatin about 3 weeks ago because it caused too much belching.    Review of Systems: As per "subjective", otherwise negative.  Allergies  Allergen Reactions  . Penicillins Hives  . Sulfa Antibiotics Hives  . Tetracyclines & Related Other (See Comments)    "yeast infections"    Current Outpatient Medications  Medication Sig Dispense Refill  . acetaminophen (TYLENOL) 500 MG tablet Take 500 mg by mouth daily.    Marland Kitchen aspirin 81 MG tablet Take 81 mg by mouth daily.      . carvedilol (COREG) 3.125 MG tablet Take 1 tablet (3.125 mg total) by mouth 2 (two) times daily. 180 tablet 3  . furosemide (LASIX) 40 MG tablet Take 1 tablet (40 mg total) by mouth daily. 90 tablet 3  . nitroGLYCERIN (NITROSTAT) 0.4 MG SL tablet Place 1 tablet (0.4 mg total) every 5 (five) minutes as needed under the tongue for chest pain. 25 tablet 3  . simvastatin (ZOCOR) 40 MG tablet TAKE 1 TABLET EVERY DAY 90 tablet 3  . spironolactone (ALDACTONE) 25 MG tablet Take 1 tablet (25 mg total) by mouth daily. 90 tablet 3   No current facility-administered medications for this visit.     Past Medical History:  Diagnosis Date  . Chronic systolic  congestive heart failure (HCC)   . Coronary artery disease    anterior MI in 2010. LAD stent placement 2010. EF: 15%  . Hypertension   . Ischemic cardiomyopathy   . LBBB (left bundle branch block)   . MR (mitral regurgitation)    moderate to severe  . Tobacco abuse   . VF (ventricular fibrillation) (HCC) 2013   nonsustained VF, spontaneously termianted    Past Surgical History:  Procedure Laterality Date  . BI-VENTRICULAR IMPLANTABLE CARDIOVERTER DEFIBRILLATOR UPGRADE  11-16-2013   upgrade of previously implanted dual chamber ICD to MDT Ovidio Kin XT CRTD by Dr Johney Frame  . BI-VENTRICULAR IMPLANTABLE CARDIOVERTER DEFIBRILLATOR UPGRADE N/A 11/16/2013   Procedure: BI-VENTRICULAR IMPLANTABLE CARDIOVERTER DEFIBRILLATOR UPGRADE;  Surgeon: Gardiner Rhyme, MD;  Location: [facility] CATH LAB;  Service: Cardiovascular;  Laterality: N/A;  . CARDIAC CATHETERIZATION  2010   LAD PCI and stent placement  . CARDIAC DEFIBRILLATOR PLACEMENT  06/14/09   MDT [facility] Dr Johney Frame  . TONSILLECTOMY AND ADENOIDECTOMY    . TUBAL LIGATION      Social History   Socioeconomic History  . Marital status: Single    Spouse name: Not on file  . Number of children: Not on file  . Years of education: Not on file  . Highest education level: Not on file  Social Needs  . Financial resource strain: Not on file  . Food insecurity - worry: Not on file  . Food insecurity - inability: Not on file  . Transportation needs -  medical: Not on file  . Transportation needs - non-medical: Not on file  Occupational History  . Occupation: RETIRED    Comment: retired bus Recruitment consultant  Tobacco Use  . Smoking status: Former Smoker    Packs/day: 0.20    Years: 45.00    Pack years: 9.00    Types: Cigarettes    Start date: 04/07/1966    Last attempt to quit: 06/04/2016    Years since quitting: 1.1  . Smokeless tobacco: Never Used  . Tobacco comment: smokes socially/ smokes 2 cigarettes per month, trying to  quit- quit 6 months ago 03/07/15  Substance and Sexual Activity  . Alcohol use: Yes    Alcohol/week: 0.0 oz    Comment: occasional alcohol use  . Drug use: No  . Sexual activity: No  Other Topics Concern  . Not on file  Social History Narrative   Moved to [place] from [place].  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 Neck: No JVD, no thyromegaly. Lungs: Clear to auscultation bilaterally with normal respiratory effort. CV: Regular rate and rhythm, normal S1/S2, no S3/S4, no murmur. No pretibial or periankle edema.  No carotid bruit.   Abdomen: Soft, nontender, no distention.  Neurologic: Alert and oriented.  Psych: Normal affect. Skin: Normal. Musculoskeletal: No gross deformities.    ECG: Most recent ECG reviewed.   Labs: Lab Results  Component Value Date/Time   K 4.5 11/29/2013 04:36 PM   BUN 25 (H) 11/29/2013 04:36 PM   CREATININE 1.5 (H) 11/29/2013 04:36 PM   HGB 14.1 11/16/2013 08:50 AM     Lipids: No results found for: LDLCALC, LDLDIRECT, CHOL, TRIG, HDL     ASSESSMENT AND PLAN: 1. Coronary artery disease with prior LAD PCI: Symptomatically stable. Continue aspirin, carvedilol, and statin therapy.  I will switch simvastatin to Crestor 20 mg due to belching.  2. Chronic combined systolic and diastolic heart failure: Euvolemic. Continue Lasix 40 mg daily and spironolactone 25 mg daily along with carvedilol.  I will discontinue losartan and start Entresto 24/26 mg twice daily beginning tomorrow morning as she took her last dose of losartan yesterday evening.  I will check a basic metabolic panel within several days of initiation.  Left ventricular  systolic function is severely reduced as detailed above, LVEF 25-30%.   3. Ventricular tachycardia s/p ICD: Stable. Continue Coreg.  4. Hypertension: Controlled.  I will monitor given switch from losartan to [facility].  5. Hyperlipidemia:   I will switch simvastatin to Crestor 20 mg due to belching.     Disposition: Follow up on 03/27/18 with Dr. Johney Frame.  Follow-up with me in 1 year.   Prentice Docker, M.D., F.A.C.C.

## 2017-08-12 NOTE — Patient Instructions (Addendum)
Medication Instructions:   Stop Losartan (Cozaar).   Begin Entresto 24/26mg  twice a day - may start this tomorrow morning.   Stop Simvastatin.  Begin Crestor 20mg  daily.  Continue all other medications.    Labwork:  BMET - order given today - do this lab 5 days after starting the Entresto.   Office will contact with results via phone or letter.    Testing/Procedures: none  Follow-Up: Your physician wants you to follow up in:  1 year.  You will receive a reminder letter in the mail one-two months in advance.  If you don't receive a letter, please call our office to schedule the follow up appointment   Any Other Special Instructions Will Be Listed Below (If Applicable).  If you need a refill on your cardiac medications before your next appointment, please call your pharmacy.

## 2017-08-12 NOTE — Addendum Note (Signed)
Addended by: Lesle Chris on: 08/12/2017 04:01 PM   Modules accepted: Orders

## 2017-08-21 ENCOUNTER — Encounter: Payer: Self-pay | Admitting: *Deleted

## 2017-08-26 ENCOUNTER — Telehealth: Payer: Self-pay | Admitting: *Deleted

## 2017-08-26 NOTE — Telephone Encounter (Signed)
Notes recorded by Lesle Chris, LPN on 01/01/7578 at 12:04 PM EDT Patient notified. Copy to pmd. ------  Notes recorded by Laqueta Linden, MD on 08/22/2017 at 11:22 AM EDT Remains stable when compared to 2018 labs.

## 2017-09-08 ENCOUNTER — Ambulatory Visit (INDEPENDENT_AMBULATORY_CARE_PROVIDER_SITE_OTHER): Payer: Medicare PPO

## 2017-09-08 DIAGNOSIS — Z95810 Presence of automatic (implantable) cardiac defibrillator: Secondary | ICD-10-CM | POA: Diagnosis not present

## 2017-09-08 DIAGNOSIS — I5042 Chronic combined systolic (congestive) and diastolic (congestive) heart failure: Secondary | ICD-10-CM | POA: Diagnosis not present

## 2017-09-09 NOTE — Progress Notes (Signed)
EPIC Encounter for ICM Monitoring  Patient Name: Tracy Mills is a 72 y.o. female Date: 09/09/2017 Primary Care Physican: [name], MD Primary [place] Electrophysiologist: Allred Dry Weight: Previous ICM weight 132lbs Bi-V Pacing: 98.3%            Attempted call to patient and unable to reach.  Left detailed message regarding transmission.  Transmission reviewed.    Thoracic impedance normal.  Prescribed dosage: Furosemide 40 mg 1 tablet daily  Labs:  08/21/2017 Creatinine 1.30, BUN 21, Potassium 4.3, Sodium 139, EGFR 40-49 07/11/2016 Creatinine 1.20, BUN 23, Potassium 4.1, Sodium 139, EGFR 46-53  Recommendations: Left voice mail with ICM number and encouraged to call if experiencing any fluid symptoms.  Follow-up plan: ICM clinic phone appointment on 10/09/2017.    Copy of ICM check sent to Dr. [name].   3 month ICM trend: 09/09/2017    1 Year ICM trend:       Rosalene Billings, RN 09/09/2017 2:51 PM

## 2017-09-10 ENCOUNTER — Other Ambulatory Visit: Payer: Self-pay | Admitting: *Deleted

## 2017-09-10 ENCOUNTER — Telehealth: Payer: Self-pay | Admitting: *Deleted

## 2017-09-10 MED ORDER — SACUBITRIL-VALSARTAN 24-26 MG PO TABS: 1.0000 | ORAL_TABLET | Freq: Two times a day (BID) | ORAL | 3 refills | Status: DC

## 2017-09-10 NOTE — Telephone Encounter (Signed)
Received fax from [facility] - Rosuvastatin 20mg  tab authorization good until 09/09/2018.

## 2017-09-18 ENCOUNTER — Telehealth: Payer: Self-pay | Admitting: *Deleted

## 2017-09-18 NOTE — Telephone Encounter (Signed)
PA approved from [facility] for Entresto through 09/18/2019

## 2017-10-09 ENCOUNTER — Ambulatory Visit (INDEPENDENT_AMBULATORY_CARE_PROVIDER_SITE_OTHER): Payer: Medicare PPO | Admitting: *Deleted

## 2017-10-09 ENCOUNTER — Telehealth: Payer: Self-pay

## 2017-10-09 DIAGNOSIS — I5022 Chronic systolic (congestive) heart failure: Secondary | ICD-10-CM | POA: Diagnosis not present

## 2017-10-09 DIAGNOSIS — Z95810 Presence of automatic (implantable) cardiac defibrillator: Secondary | ICD-10-CM

## 2017-10-09 NOTE — Telephone Encounter (Signed)
Remote ICM transmission received.  Attempted call to patient and left detailed message, per DPR, regarding transmission and next ICM scheduled for 11/10/2017.  Advised to return call for any fluid symptoms or questions.    

## 2017-10-09 NOTE — Progress Notes (Signed)
EPIC Encounter for ICM Monitoring  Patient Name: Tracy Mills is a 72 y.o. female Date: 10/09/2017 Primary Care Physican: [name], MD Primary [place] Electrophysiologist: Allred Dry Weight: Previous ICM weight 132lbs Bi-V Pacing: 95.2%                                                   Attempted call to patient and unable to reach.  Left detailed message regarding transmission.  Transmission reviewed.    Thoracic impedance normal.  Prescribed dosage: Furosemide 40 mg 1 tablet daily  Labs:  08/21/2017 Creatinine 1.30, BUN 21, Potassium 4.3, Sodium 139, EGFR 40-49 07/11/2016 Creatinine 1.20, BUN 23, Potassium 4.1, Sodium 139, EGFR 46-53  Recommendations: Left voice mail with ICM number and encouraged to call if experiencing any fluid symptoms.  Follow-up plan: ICM clinic phone appointment on 11/10/2017.    Copy of ICM check sent to Dr. [name].   3 month ICM trend: 10/09/2017    1 Year ICM trend:       Rosalene Billings, RN 10/09/2017 4:53 PM

## 2017-10-09 NOTE — Progress Notes (Signed)
Remote ICD transmission.   

## 2017-10-10 ENCOUNTER — Encounter: Payer: Self-pay | Admitting: Cardiology

## 2017-10-14 LAB — CUP PACEART REMOTE DEVICE CHECK
Battery Voltage: 2.92 V
Brady Statistic AP VP Percent: 46 %
Brady Statistic AS VP Percent: 53.82 %
Brady Statistic AS VS Percent: 0.12 %
Brady Statistic RA Percent Paced: 45.84 %
Brady Statistic RV Percent Paced: 95.17 %
Date Time Interrogation Session: 20190516041803
HighPow Impedance: 48 Ohm
HighPow Impedance: 58 Ohm
Implantable Lead Implant Date: 20110119
Implantable Lead Location: 753859
Implantable Lead Location: 753860
Implantable Lead Model: 5076
Implantable Pulse Generator Implant Date: 20150623
Lead Channel Impedance Value: 1121 Ohm
Lead Channel Impedance Value: 456 Ohm
Lead Channel Impedance Value: 456 Ohm
Lead Channel Impedance Value: 532 Ohm
Lead Channel Impedance Value: 608 Ohm
Lead Channel Impedance Value: 665 Ohm
Lead Channel Impedance Value: 817 Ohm
Lead Channel Impedance Value: 836 Ohm
Lead Channel Pacing Threshold Amplitude: 3.5 V
Lead Channel Pacing Threshold Pulse Width: 0.4 ms
Lead Channel Sensing Intrinsic Amplitude: 2.25 mV
Lead Channel Sensing Intrinsic Amplitude: 2.25 mV
Lead Channel Sensing Intrinsic Amplitude: 29.625 mV
Lead Channel Sensing Intrinsic Amplitude: 29.625 mV
Lead Channel Setting Pacing Amplitude: 2 V
Lead Channel Setting Pacing Amplitude: 2.5 V
Lead Channel Setting Pacing Pulse Width: 0.8 ms
Lead Channel Setting Sensing Sensitivity: 0.3 mV
MDC IDC LEAD IMPLANT DT: 20110119
MDC IDC LEAD IMPLANT DT: 20150623
MDC IDC LEAD LOCATION: 753858
MDC IDC MSMT BATTERY REMAINING LONGEVITY: 17 mo
MDC IDC MSMT LEADCHNL LV IMPEDANCE VALUE: 1007 Ohm
MDC IDC MSMT LEADCHNL LV IMPEDANCE VALUE: 722 Ohm
MDC IDC MSMT LEADCHNL LV IMPEDANCE VALUE: 950 Ohm
MDC IDC MSMT LEADCHNL LV PACING THRESHOLD PULSEWIDTH: 0.8 ms
MDC IDC MSMT LEADCHNL RA PACING THRESHOLD AMPLITUDE: 0.5 V
MDC IDC MSMT LEADCHNL RA PACING THRESHOLD PULSEWIDTH: 0.4 ms
MDC IDC MSMT LEADCHNL RV IMPEDANCE VALUE: 418 Ohm
MDC IDC MSMT LEADCHNL RV IMPEDANCE VALUE: 475 Ohm
MDC IDC MSMT LEADCHNL RV PACING THRESHOLD AMPLITUDE: 1.125 V
MDC IDC SET LEADCHNL LV PACING AMPLITUDE: 4.75 V
MDC IDC SET LEADCHNL RV PACING PULSEWIDTH: 0.4 ms
MDC IDC STAT BRADY AP VS PERCENT: 0.07 %

## 2017-10-30 ENCOUNTER — Ambulatory Visit: Payer: Medicare PPO | Admitting: Cardiovascular Disease

## 2017-11-10 ENCOUNTER — Ambulatory Visit (INDEPENDENT_AMBULATORY_CARE_PROVIDER_SITE_OTHER): Payer: Medicare PPO

## 2017-11-10 ENCOUNTER — Telehealth: Payer: Self-pay

## 2017-11-10 DIAGNOSIS — I5022 Chronic systolic (congestive) heart failure: Secondary | ICD-10-CM

## 2017-11-10 DIAGNOSIS — Z95810 Presence of automatic (implantable) cardiac defibrillator: Secondary | ICD-10-CM | POA: Diagnosis not present

## 2017-11-10 NOTE — Telephone Encounter (Signed)
Spoke with pt informed her that one of the ICD leads is using more energy than it previously has and that she needed to be seen in clinic to evaluate the lead. Pt agreeable to apt in [place] on 11/28/17 with device clinic.

## 2017-11-10 NOTE — Progress Notes (Signed)
EPIC Encounter for ICM Monitoring  Patient Name: Tracy Mills is a 72 y.o. female Date: 11/10/2017 Primary Care Physican: [name], MD Primary [place] Electrophysiologist: Allred Dry Weight:138lbs Bi-V Pacing: 96.7%      Heart Failure questions reviewed, pt has a cough.  Device clinic contacted patient 11/10/17 to inform her one of the leads is using more energy than it previously had.  She has an appointment 11/28/2017 for evaluation.   Thoracic impedance abnormal suggesting fluid accumulation since 11/03/2017.  Prescribed dosage: Furosemide 40 mg 1 tablet daily  Labs: 08/21/2017 Creatinine 1.30, BUN 21, Potassium 4.3, Sodium 139, EGFR 40-49 07/11/2016 Creatinine 1.20, BUN 23, Potassium 4.1, Sodium 139, EGFR 46-53  Recommendations:  Reinforced sodium restriction to less than 2000 mg daily.  Encouraged to call for fluid symptoms.  Follow-up plan: ICM clinic phone appointment on 11/17/2017 to recheck fluid levels.  Office appointment scheduled 11/28/2017 with Dr. [name].  Copy of ICM check sent to Dr. [name] and Dr. [name] for review and recommendations if needed.   3 month ICM trend: 11/10/2017    1 Year ICM trend:       Rosalene Billings, RN 11/10/2017 2:47 PM

## 2017-11-12 LAB — CUP PACEART REMOTE DEVICE CHECK
Battery Voltage: 2.91 V
Brady Statistic AP VP Percent: 47.37 %
Brady Statistic AP VS Percent: 0.05 %
Brady Statistic AS VP Percent: 52.35 %
Brady Statistic RA Percent Paced: 47.09 %
Brady Statistic RV Percent Paced: 96.69 %
HighPow Impedance: 42 Ohm
HighPow Impedance: 52 Ohm
Implantable Lead Implant Date: 20110119
Implantable Lead Implant Date: 20110119
Implantable Lead Location: 753858
Implantable Lead Location: 753860
Implantable Lead Model: 5076
Lead Channel Impedance Value: 418 Ohm
Lead Channel Impedance Value: 456 Ohm
Lead Channel Impedance Value: 456 Ohm
Lead Channel Impedance Value: 513 Ohm
Lead Channel Impedance Value: 513 Ohm
Lead Channel Impedance Value: 589 Ohm
Lead Channel Impedance Value: 760 Ohm
Lead Channel Impedance Value: 817 Ohm
Lead Channel Impedance Value: 950 Ohm
Lead Channel Pacing Threshold Pulse Width: 0.4 ms
Lead Channel Pacing Threshold Pulse Width: 0.4 ms
Lead Channel Sensing Intrinsic Amplitude: 2.5 mV
Lead Channel Sensing Intrinsic Amplitude: 2.5 mV
Lead Channel Sensing Intrinsic Amplitude: 26.625 mV
Lead Channel Sensing Intrinsic Amplitude: 26.625 mV
Lead Channel Setting Pacing Amplitude: 2 V
Lead Channel Setting Pacing Pulse Width: 0.4 ms
Lead Channel Setting Pacing Pulse Width: 0.8 ms
MDC IDC LEAD IMPLANT DT: 20150623
MDC IDC LEAD LOCATION: 753859
MDC IDC MSMT BATTERY REMAINING LONGEVITY: 16 mo
MDC IDC MSMT LEADCHNL LV IMPEDANCE VALUE: 665 Ohm
MDC IDC MSMT LEADCHNL LV IMPEDANCE VALUE: 874 Ohm
MDC IDC MSMT LEADCHNL LV IMPEDANCE VALUE: 988 Ohm
MDC IDC MSMT LEADCHNL LV PACING THRESHOLD AMPLITUDE: 5.5 V
MDC IDC MSMT LEADCHNL LV PACING THRESHOLD PULSEWIDTH: 0.8 ms
MDC IDC MSMT LEADCHNL RA PACING THRESHOLD AMPLITUDE: 0.5 V
MDC IDC MSMT LEADCHNL RV IMPEDANCE VALUE: 361 Ohm
MDC IDC MSMT LEADCHNL RV PACING THRESHOLD AMPLITUDE: 1.25 V
MDC IDC PG IMPLANT DT: 20150623
MDC IDC SESS DTM: 20190617083723
MDC IDC SET LEADCHNL LV PACING AMPLITUDE: 6 V
MDC IDC SET LEADCHNL RV PACING AMPLITUDE: 2.5 V
MDC IDC SET LEADCHNL RV SENSING SENSITIVITY: 0.3 mV
MDC IDC STAT BRADY AS VS PERCENT: 0.23 %

## 2017-11-17 ENCOUNTER — Ambulatory Visit (INDEPENDENT_AMBULATORY_CARE_PROVIDER_SITE_OTHER): Payer: Self-pay

## 2017-11-17 DIAGNOSIS — Z95810 Presence of automatic (implantable) cardiac defibrillator: Secondary | ICD-10-CM

## 2017-11-17 DIAGNOSIS — I5022 Chronic systolic (congestive) heart failure: Secondary | ICD-10-CM

## 2017-11-17 NOTE — Progress Notes (Signed)
EPIC Encounter for ICM Monitoring  Patient Name: Tracy Mills is a 72 y.o. female Date: 11/17/2017 Primary Care Physican: [name], MD Primary [place] Electrophysiologist: Allred Dry Weight:138lbs Bi-V Pacing: 99.7%       Heart Failure questions reviewed, pt has occasional cough when lying down at night.  Device clinic contacted patient 11/10/17 to inform her one of the leads is using more energy than it previously had.  She has an appointment 11/28/2017 for evaluation.   Thoracic impedance abnormal suggesting fluid accumulation starting 11/12/2017 and fluid index crossed threshold ~11/03/2017  Prescribed dosage: Furosemide 40 mg 1 tablet daily  Labs: 08/21/2017 Creatinine 1.30, BUN 21, Potassium 4.3, Sodium 139, EGFR 40-49 07/11/2016 Creatinine 1.20, BUN 23, Potassium 4.1, Sodium 139, EGFR 46-53  Recommendations: Reinforced sodium restriction to less than 2000 mg daily.  Encouraged to call for fluid symptoms.  Follow-up plan: ICM clinic phone appointment on 12/11/2017 and device will be checked at the next office appointment scheduled 11/28/2017 with Dr. [name]. Recall appt 08/12/2018 with Dr. [name].  Copy of ICM check sent to Dr. [name] and Dr. [name] for review and will call back with any recommendations.    3 month ICM trend: 11/17/2017    1 Year ICM trend:       Rosalene Billings, RN 11/17/2017 12:13 PM

## 2017-11-19 DIAGNOSIS — Z6823 Body mass index (BMI) 23.0-23.9, adult: Secondary | ICD-10-CM | POA: Diagnosis not present

## 2017-11-19 DIAGNOSIS — Z0000 Encounter for general adult medical examination without abnormal findings: Secondary | ICD-10-CM | POA: Diagnosis not present

## 2017-11-19 DIAGNOSIS — Z1211 Encounter for screening for malignant neoplasm of colon: Secondary | ICD-10-CM | POA: Diagnosis not present

## 2017-11-19 DIAGNOSIS — I10 Essential (primary) hypertension: Secondary | ICD-10-CM | POA: Diagnosis not present

## 2017-11-19 DIAGNOSIS — Z1339 Encounter for screening examination for other mental health and behavioral disorders: Secondary | ICD-10-CM | POA: Diagnosis not present

## 2017-11-19 DIAGNOSIS — R5383 Other fatigue: Secondary | ICD-10-CM | POA: Diagnosis not present

## 2017-11-19 DIAGNOSIS — Z7189 Other specified counseling: Secondary | ICD-10-CM | POA: Diagnosis not present

## 2017-11-19 DIAGNOSIS — Z79899 Other long term (current) drug therapy: Secondary | ICD-10-CM | POA: Diagnosis not present

## 2017-11-19 DIAGNOSIS — Z299 Encounter for prophylactic measures, unspecified: Secondary | ICD-10-CM | POA: Diagnosis not present

## 2017-11-19 DIAGNOSIS — E7800 Pure hypercholesterolemia, unspecified: Secondary | ICD-10-CM | POA: Diagnosis not present

## 2017-11-19 DIAGNOSIS — Z1331 Encounter for screening for depression: Secondary | ICD-10-CM | POA: Diagnosis not present

## 2017-12-11 ENCOUNTER — Telehealth: Payer: Self-pay

## 2017-12-11 NOTE — Telephone Encounter (Signed)
LMOVM reminding pt to send remote transmission.   

## 2017-12-12 NOTE — Progress Notes (Signed)
No ICM remote transmission received for 12/11/2017 and next ICM transmission scheduled for 01/08/2018.    

## 2017-12-26 ENCOUNTER — Telehealth: Payer: Self-pay | Admitting: *Deleted

## 2017-12-26 ENCOUNTER — Ambulatory Visit: Payer: Medicare PPO | Admitting: *Deleted

## 2017-12-26 DIAGNOSIS — I472 Ventricular tachycardia, unspecified: Secondary | ICD-10-CM

## 2017-12-26 DIAGNOSIS — I5022 Chronic systolic (congestive) heart failure: Secondary | ICD-10-CM | POA: Diagnosis not present

## 2017-12-26 DIAGNOSIS — Z95810 Presence of automatic (implantable) cardiac defibrillator: Secondary | ICD-10-CM

## 2017-12-26 DIAGNOSIS — E785 Hyperlipidemia, unspecified: Secondary | ICD-10-CM

## 2017-12-26 DIAGNOSIS — I255 Ischemic cardiomyopathy: Secondary | ICD-10-CM | POA: Diagnosis not present

## 2017-12-26 LAB — CUP PACEART INCLINIC DEVICE CHECK
Battery Remaining Longevity: 12 mo
Battery Voltage: 2.91 V
Brady Statistic AS VP Percent: 52.21 %
Brady Statistic AS VS Percent: 0.22 %
Brady Statistic RA Percent Paced: 47.38 %
Date Time Interrogation Session: 20190802161316
HIGH POWER IMPEDANCE MEASURED VALUE: 43 Ohm
HIGH POWER IMPEDANCE MEASURED VALUE: 51 Ohm
Implantable Lead Implant Date: 20110119
Implantable Lead Implant Date: 20150623
Implantable Lead Location: 753859
Implantable Lead Location: 753860
Implantable Lead Model: 4598
Implantable Lead Model: 5076
Lead Channel Impedance Value: 1007 Ohm
Lead Channel Impedance Value: 361 Ohm
Lead Channel Impedance Value: 456 Ohm
Lead Channel Impedance Value: 513 Ohm
Lead Channel Impedance Value: 665 Ohm
Lead Channel Impedance Value: 779 Ohm
Lead Channel Impedance Value: 817 Ohm
Lead Channel Impedance Value: 931 Ohm
Lead Channel Pacing Threshold Amplitude: 0.5 V
Lead Channel Pacing Threshold Pulse Width: 0.4 ms
Lead Channel Pacing Threshold Pulse Width: 0.8 ms
Lead Channel Sensing Intrinsic Amplitude: 2 mV
Lead Channel Sensing Intrinsic Amplitude: 31.625 mV
Lead Channel Setting Pacing Amplitude: 2.5 V
Lead Channel Setting Pacing Pulse Width: 0.4 ms
Lead Channel Setting Sensing Sensitivity: 0.3 mV
MDC IDC LEAD IMPLANT DT: 20110119
MDC IDC LEAD LOCATION: 753858
MDC IDC MSMT LEADCHNL LV IMPEDANCE VALUE: 361 Ohm
MDC IDC MSMT LEADCHNL LV IMPEDANCE VALUE: 475 Ohm
MDC IDC MSMT LEADCHNL LV IMPEDANCE VALUE: 646 Ohm
MDC IDC MSMT LEADCHNL LV IMPEDANCE VALUE: 931 Ohm
MDC IDC MSMT LEADCHNL LV PACING THRESHOLD AMPLITUDE: 2 V
MDC IDC MSMT LEADCHNL RA IMPEDANCE VALUE: 418 Ohm
MDC IDC MSMT LEADCHNL RV PACING THRESHOLD AMPLITUDE: 1 V
MDC IDC MSMT LEADCHNL RV PACING THRESHOLD PULSEWIDTH: 0.4 ms
MDC IDC PG IMPLANT DT: 20150623
MDC IDC SET LEADCHNL LV PACING AMPLITUDE: 3 V
MDC IDC SET LEADCHNL LV PACING PULSEWIDTH: 0.8 ms
MDC IDC SET LEADCHNL RA PACING AMPLITUDE: 2 V
MDC IDC STAT BRADY AP VP PERCENT: 47.51 %
MDC IDC STAT BRADY AP VS PERCENT: 0.06 %
MDC IDC STAT BRADY RV PERCENT PACED: 96.1 %

## 2017-12-26 MED ORDER — ROSUVASTATIN CALCIUM 20 MG PO TABS: 20.0000 mg | ORAL_TABLET | Freq: Every day | ORAL | 6 refills | Status: DC

## 2017-12-26 NOTE — Telephone Encounter (Signed)
Called patient to advise that Crestor prescription refill was sent electronically to [facility] pharmacy per her request.  Also confirmed that patient was asymptomatic with HVR episode on 04/26/17.

## 2017-12-26 NOTE — Progress Notes (Signed)
CRT-D device check in office due to elevated LV threshold noted on remote transmission. RA and RV thresholds, sensing, and impedances consistent with previous device measurements. LV impedance stable, threshold (LV4-LV1) now 3.75V @ 0.9ms. Ran VectorExpress and manual testing, reprogrammed LV vector to LV1-LV4 based on threshold result of 2.0V @ 0.53ms, no diaphragmatic stim reported. LV output reduced to 3.0V (on adaptive). No mode switch episodes recorded. 2 monitored VT-NS episodes--EGMs appear NSVT, episode from 04/26/17 appears NSVT, then VT below detection (rate ~136bpm), patient does not recall any symptoms. Patient bi-ventricularly pacing 96.1% of the time. Device programmed with appropriate safety margins; max RA/RV/LV pacing impedances increased to 2000ohms per protocol. Wavelet template updated today. Heart failure diagnostics reviewed and trends are stable for patient at present, followed by [facility]. Audible alerts demonstrated for patient, alert time reprogrammed to 10:00 per patient preference. Estimated longevity 12 months. Patient enrolled in remote follow up. Patient education completed including shock plan. Carelink on 01/08/18 and ROV with JA/E on 03/27/18.  Will review monitored VT-NS EGMs and LV vector reprogramming with Dr. Johney Frame to confirm no further recommendations.

## 2017-12-29 NOTE — Telephone Encounter (Signed)
Reviewed episode from 12/1 with Dr. Johney Frame.  P-waves are challenging to see on baseline EGM, so difficult to rule out SVT.  No changes at this time.

## 2018-01-08 ENCOUNTER — Ambulatory Visit (INDEPENDENT_AMBULATORY_CARE_PROVIDER_SITE_OTHER): Payer: Medicare PPO

## 2018-01-08 ENCOUNTER — Ambulatory Visit (INDEPENDENT_AMBULATORY_CARE_PROVIDER_SITE_OTHER): Payer: Medicare PPO | Admitting: *Deleted

## 2018-01-08 DIAGNOSIS — I5042 Chronic combined systolic (congestive) and diastolic (congestive) heart failure: Secondary | ICD-10-CM

## 2018-01-08 DIAGNOSIS — Z95810 Presence of automatic (implantable) cardiac defibrillator: Secondary | ICD-10-CM

## 2018-01-08 DIAGNOSIS — I255 Ischemic cardiomyopathy: Secondary | ICD-10-CM

## 2018-01-08 NOTE — Progress Notes (Signed)
Remote ICD transmission.   

## 2018-01-09 NOTE — Progress Notes (Signed)
EPIC Encounter for ICM Monitoring  Patient Name: Tracy Mills is a 72 y.o. female Date: 01/09/2018 Primary Care Physican: [name], MD Primary [place] Electrophysiologist: Allred Dry Weight:138lbs Bi-V Pacing: 99.6%      Heart Failure questions reviewed, pt asymptomatic.   Thoracic impedance normal.  Prescribed dosage: Furosemide 40 mg 1 tablet daily  Labs: 08/21/2017 Creatinine 1.30, BUN 21, Potassium 4.3, Sodium 139, EGFR 40-49 07/11/2016 Creatinine 1.20, BUN 23, Potassium 4.1, Sodium 139, EGFR 46-53  Recommendations: No changes.   Encouraged to call for fluid symptoms.  Follow-up plan: ICM clinic phone appointment on 02/19/2018.    Copy of ICM check sent to Dr. [name].   3 month ICM trend: 01/08/2018    1 Year ICM trend:       Rosalene Billings, RN 01/09/2018 10:44 AM

## 2018-02-13 ENCOUNTER — Other Ambulatory Visit: Payer: Self-pay | Admitting: Cardiovascular Disease

## 2018-02-13 DIAGNOSIS — I5022 Chronic systolic (congestive) heart failure: Secondary | ICD-10-CM

## 2018-02-16 LAB — CUP PACEART REMOTE DEVICE CHECK
Battery Voltage: 2.92 V
Brady Statistic AP VS Percent: 0.04 %
Brady Statistic RA Percent Paced: 57.29 %
Brady Statistic RV Percent Paced: 99.6 %
HIGH POWER IMPEDANCE MEASURED VALUE: 49 Ohm
HighPow Impedance: 40 Ohm
Implantable Lead Implant Date: 20110119
Implantable Lead Implant Date: 20150623
Implantable Lead Location: 753858
Implantable Lead Location: 753860
Implantable Lead Model: 4598
Implantable Lead Model: 5076
Implantable Lead Model: 6947
Lead Channel Impedance Value: 361 Ohm
Lead Channel Impedance Value: 418 Ohm
Lead Channel Impedance Value: 475 Ohm
Lead Channel Impedance Value: 551 Ohm
Lead Channel Impedance Value: 589 Ohm
Lead Channel Impedance Value: 779 Ohm
Lead Channel Impedance Value: 817 Ohm
Lead Channel Impedance Value: 874 Ohm
Lead Channel Pacing Threshold Amplitude: 1.125 V
Lead Channel Pacing Threshold Pulse Width: 0.4 ms
Lead Channel Pacing Threshold Pulse Width: 0.4 ms
Lead Channel Sensing Intrinsic Amplitude: 1.875 mV
Lead Channel Sensing Intrinsic Amplitude: 1.875 mV
Lead Channel Sensing Intrinsic Amplitude: 24.625 mV
Lead Channel Sensing Intrinsic Amplitude: 24.625 mV
Lead Channel Setting Pacing Amplitude: 2 V
Lead Channel Setting Pacing Amplitude: 3 V
Lead Channel Setting Pacing Pulse Width: 0.8 ms
MDC IDC LEAD IMPLANT DT: 20110119
MDC IDC LEAD LOCATION: 753859
MDC IDC MSMT BATTERY REMAINING LONGEVITY: 12 mo
MDC IDC MSMT LEADCHNL LV IMPEDANCE VALUE: 1007 Ohm
MDC IDC MSMT LEADCHNL LV IMPEDANCE VALUE: 532 Ohm
MDC IDC MSMT LEADCHNL LV IMPEDANCE VALUE: 703 Ohm
MDC IDC MSMT LEADCHNL LV IMPEDANCE VALUE: 950 Ohm
MDC IDC MSMT LEADCHNL LV PACING THRESHOLD AMPLITUDE: 2 V
MDC IDC MSMT LEADCHNL LV PACING THRESHOLD PULSEWIDTH: 0.8 ms
MDC IDC MSMT LEADCHNL RA PACING THRESHOLD AMPLITUDE: 0.5 V
MDC IDC MSMT LEADCHNL RV IMPEDANCE VALUE: 361 Ohm
MDC IDC PG IMPLANT DT: 20150623
MDC IDC SESS DTM: 20190815082504
MDC IDC SET LEADCHNL RV PACING AMPLITUDE: 2.5 V
MDC IDC SET LEADCHNL RV PACING PULSEWIDTH: 0.4 ms
MDC IDC SET LEADCHNL RV SENSING SENSITIVITY: 0.3 mV
MDC IDC STAT BRADY AP VP PERCENT: 57.33 %
MDC IDC STAT BRADY AS VP PERCENT: 42.56 %
MDC IDC STAT BRADY AS VS PERCENT: 0.07 %

## 2018-02-19 ENCOUNTER — Ambulatory Visit (INDEPENDENT_AMBULATORY_CARE_PROVIDER_SITE_OTHER): Payer: Medicare PPO

## 2018-02-19 ENCOUNTER — Telehealth: Payer: Self-pay

## 2018-02-19 DIAGNOSIS — Z95810 Presence of automatic (implantable) cardiac defibrillator: Secondary | ICD-10-CM

## 2018-02-19 DIAGNOSIS — I5042 Chronic combined systolic (congestive) and diastolic (congestive) heart failure: Secondary | ICD-10-CM

## 2018-02-19 NOTE — Telephone Encounter (Signed)
Remote ICM transmission received.  Attempted call to patient and left detailed message, per DPR, to return call regarding transmission.    

## 2018-02-19 NOTE — Progress Notes (Signed)
EPIC Encounter for ICM Monitoring  Patient Name: Tracy Mills is a 72 y.o. female Date: 02/19/2018 Primary Care Physican: [name], MD Primary [place] Electrophysiologist: Allred Dry Weight:Previous weight 138lbs Bi-V Pacing: 96.3%      Attempted call to patient and unable to reach.  Left message to return call, per DPR, regarding transmission.  Transmission reviewed.    Thoracic impedance abnormal suggesting fluid accumulation.  Prescribed: Furosemide 40 mg 1 tablet daily  Labs: 08/21/2017 Creatinine 1.30, BUN 21, Potassium 4.3, Sodium 139, EGFR 40-49 07/11/2016 Creatinine 1.20, BUN 23, Potassium 4.1, Sodium 139, EGFR 46-53  Recommendations: Unable to reach.  Follow-up plan: ICM clinic phone appointment on 03/05/2018 to recheck fluid levels.       Copy of ICM check sent to Dr. [name].   3 month ICM trend: 02/19/2018    1 Year ICM trend:       Rosalene Billings, RN 02/19/2018 2:12 PM

## 2018-02-26 DIAGNOSIS — I10 Essential (primary) hypertension: Secondary | ICD-10-CM | POA: Diagnosis not present

## 2018-02-26 DIAGNOSIS — Z6822 Body mass index (BMI) 22.0-22.9, adult: Secondary | ICD-10-CM | POA: Diagnosis not present

## 2018-02-26 DIAGNOSIS — I509 Heart failure, unspecified: Secondary | ICD-10-CM | POA: Diagnosis not present

## 2018-02-26 DIAGNOSIS — J069 Acute upper respiratory infection, unspecified: Secondary | ICD-10-CM | POA: Diagnosis not present

## 2018-02-26 DIAGNOSIS — Z299 Encounter for prophylactic measures, unspecified: Secondary | ICD-10-CM | POA: Diagnosis not present

## 2018-02-26 DIAGNOSIS — I25700 Atherosclerosis of coronary artery bypass graft(s), unspecified, with unstable angina pectoris: Secondary | ICD-10-CM | POA: Diagnosis not present

## 2018-03-05 ENCOUNTER — Ambulatory Visit (INDEPENDENT_AMBULATORY_CARE_PROVIDER_SITE_OTHER): Payer: Medicare PPO

## 2018-03-05 DIAGNOSIS — Z95810 Presence of automatic (implantable) cardiac defibrillator: Secondary | ICD-10-CM

## 2018-03-05 DIAGNOSIS — I5042 Chronic combined systolic (congestive) and diastolic (congestive) heart failure: Secondary | ICD-10-CM

## 2018-03-06 NOTE — Progress Notes (Signed)
EPIC Encounter for ICM Monitoring  Patient Name: Tracy Mills is a 72 y.o. female Date: 03/06/2018 Primary Care Physican: [name], MD Primary [place] Electrophysiologist: Allred Dry Weight:Previous weight 138lbs Bi-V Pacing: 99.1%          Attempted call to patient and unable to reah.  Left detailed message, per DPR, regarding transmission.  Transmission reviewed.    Thoracic impedance abnormal suggesting fluid accumulation starting 02/14/2018 but trending close to baseline.   Prescribed: Furosemide 40 mg 1 tablet daily  Labs: 08/21/2017 Creatinine 1.30, BUN 21, Potassium 4.3, Sodium 139, EGFR 40-49 07/11/2016 Creatinine 1.20, BUN 23, Potassium 4.1, Sodium 139, EGFR 46-53  Recommendations: Left voice mail with ICM number and encouraged to call if experiencing any fluid symptoms.  Follow-up plan: ICM clinic phone appointment on 03/12/2018 to recheck fluid levels.   Office appointment scheduled 03/27/2018 with Dr. [name].    Copy of ICM check sent to Dr. [name].   3 month ICM trend: 03/05/2018    1 Year ICM trend:       Rosalene Billings, RN 03/06/2018 10:34 AM

## 2018-03-12 ENCOUNTER — Ambulatory Visit (INDEPENDENT_AMBULATORY_CARE_PROVIDER_SITE_OTHER): Payer: Self-pay

## 2018-03-12 DIAGNOSIS — I5042 Chronic combined systolic (congestive) and diastolic (congestive) heart failure: Secondary | ICD-10-CM

## 2018-03-12 DIAGNOSIS — Z95810 Presence of automatic (implantable) cardiac defibrillator: Secondary | ICD-10-CM

## 2018-03-13 ENCOUNTER — Telehealth: Payer: Self-pay

## 2018-03-13 NOTE — Telephone Encounter (Signed)
Remote ICM transmission received.  Attempted call to patient regarding ICM remote transmission and no answer or answering machine. 

## 2018-03-13 NOTE — Progress Notes (Signed)
EPIC Encounter for ICM Monitoring  Patient Name: Tracy Mills is a 72 y.o. female Date: 03/13/2018 Primary Care Physican: [name], MD Primary [place] Electrophysiologist: Allred Dry Weight:Previous weight138lbs Bi-V Pacing: 97.1%      Attempted call to patient and unable to reach.  Transmission reviewed.    Thoracic impedance returned to normal since last remote transmission 03/05/2018.    Prescribed: Furosemide 40 mg 1 tablet daily  Labs: 08/21/2017 Creatinine 1.30, BUN 21, Potassium 4.3, Sodium 139, EGFR 40-49 07/11/2016 Creatinine 1.20, BUN 23, Potassium 4.1, Sodium 139, EGFR 46-53  Recommendations: Unable to reach.  Follow-up plan: ICM clinic phone appointment on 04/27/2018.   Office appointment scheduled 03/27/2018 with Dr. [name].    Copy of ICM check sent to Dr. [name].   3 month ICM trend: 03/12/2018    1 Year ICM trend:       Rosalene Billings, RN 03/13/2018 2:27 PM

## 2018-03-25 ENCOUNTER — Telehealth: Payer: Self-pay | Admitting: Cardiovascular Disease

## 2018-03-25 NOTE — Telephone Encounter (Signed)
1. What dental office are you calling from? [facility]  2. What is your office phone number? [phone]  3. What is your fax number?  4. What type of procedure is the patient having performed? Tooth extracted tomorrow per patient    5. What date is procedure scheduled or is the patient there now?    03/26/18   6. What is your question (ex. Antibiotics prior to procedure, holding medication-we need to know how long dentist wants pt to hold med)?      Patient wants to know if she needs to hold any medication

## 2018-03-25 NOTE — Telephone Encounter (Signed)
Verified medications and advised no need to hold any medications.

## 2018-03-27 ENCOUNTER — Encounter: Payer: Self-pay | Admitting: Internal Medicine

## 2018-03-27 ENCOUNTER — Ambulatory Visit (INDEPENDENT_AMBULATORY_CARE_PROVIDER_SITE_OTHER): Payer: Medicare PPO | Admitting: Internal Medicine

## 2018-03-27 VITALS — BP 118/77 | HR 67 | Ht 63.0 in | Wt 131.8 lb

## 2018-03-27 DIAGNOSIS — I472 Ventricular tachycardia, unspecified: Secondary | ICD-10-CM

## 2018-03-27 DIAGNOSIS — I10 Essential (primary) hypertension: Secondary | ICD-10-CM | POA: Diagnosis not present

## 2018-03-27 DIAGNOSIS — I5042 Chronic combined systolic (congestive) and diastolic (congestive) heart failure: Secondary | ICD-10-CM | POA: Diagnosis not present

## 2018-03-27 DIAGNOSIS — Z95810 Presence of automatic (implantable) cardiac defibrillator: Secondary | ICD-10-CM

## 2018-03-27 DIAGNOSIS — I255 Ischemic cardiomyopathy: Secondary | ICD-10-CM | POA: Diagnosis not present

## 2018-03-27 DIAGNOSIS — I5022 Chronic systolic (congestive) heart failure: Secondary | ICD-10-CM | POA: Diagnosis not present

## 2018-03-27 LAB — CUP PACEART INCLINIC DEVICE CHECK
Battery Remaining Longevity: 18 mo
Brady Statistic AP VS Percent: 0.04 %
Brady Statistic AS VS Percent: 0.17 %
Brady Statistic RA Percent Paced: 48.21 %
Date Time Interrogation Session: 20191101151819
HIGH POWER IMPEDANCE MEASURED VALUE: 43 Ohm
HighPow Impedance: 53 Ohm
Implantable Lead Implant Date: 20110119
Implantable Lead Implant Date: 20110119
Implantable Lead Location: 753858
Implantable Lead Model: 5076
Implantable Pulse Generator Implant Date: 20150623
Lead Channel Impedance Value: 361 Ohm
Lead Channel Impedance Value: 399 Ohm
Lead Channel Impedance Value: 456 Ohm
Lead Channel Impedance Value: 475 Ohm
Lead Channel Impedance Value: 551 Ohm
Lead Channel Impedance Value: 646 Ohm
Lead Channel Impedance Value: 646 Ohm
Lead Channel Impedance Value: 874 Ohm
Lead Channel Pacing Threshold Pulse Width: 0.4 ms
Lead Channel Sensing Intrinsic Amplitude: 30 mV
Lead Channel Sensing Intrinsic Amplitude: 30.5 mV
Lead Channel Setting Pacing Amplitude: 2.5 V
Lead Channel Setting Pacing Amplitude: 2.75 V
Lead Channel Setting Pacing Pulse Width: 0.4 ms
Lead Channel Setting Pacing Pulse Width: 0.8 ms
MDC IDC LEAD IMPLANT DT: 20150623
MDC IDC LEAD LOCATION: 753859
MDC IDC LEAD LOCATION: 753860
MDC IDC MSMT BATTERY VOLTAGE: 2.92 V
MDC IDC MSMT LEADCHNL LV IMPEDANCE VALUE: 456 Ohm
MDC IDC MSMT LEADCHNL LV IMPEDANCE VALUE: 608 Ohm
MDC IDC MSMT LEADCHNL LV IMPEDANCE VALUE: 950 Ohm
MDC IDC MSMT LEADCHNL LV IMPEDANCE VALUE: 988 Ohm
MDC IDC MSMT LEADCHNL LV PACING THRESHOLD AMPLITUDE: 1.75 V
MDC IDC MSMT LEADCHNL LV PACING THRESHOLD PULSEWIDTH: 0.8 ms
MDC IDC MSMT LEADCHNL RA IMPEDANCE VALUE: 418 Ohm
MDC IDC MSMT LEADCHNL RA PACING THRESHOLD AMPLITUDE: 0.5 V
MDC IDC MSMT LEADCHNL RA PACING THRESHOLD PULSEWIDTH: 0.4 ms
MDC IDC MSMT LEADCHNL RA SENSING INTR AMPL: 1.125 mV
MDC IDC MSMT LEADCHNL RA SENSING INTR AMPL: 2 mV
MDC IDC MSMT LEADCHNL RV PACING THRESHOLD AMPLITUDE: 1 V
MDC IDC SET LEADCHNL RA PACING AMPLITUDE: 2 V
MDC IDC SET LEADCHNL RV SENSING SENSITIVITY: 0.3 mV
MDC IDC STAT BRADY AP VP PERCENT: 48.32 %
MDC IDC STAT BRADY AS VP PERCENT: 51.47 %
MDC IDC STAT BRADY RV PERCENT PACED: 96.91 %

## 2018-03-27 MED ORDER — NITROGLYCERIN 0.4 MG SL SUBL: 0.4000 mg | SUBLINGUAL_TABLET | SUBLINGUAL | 3 refills | Status: DC | PRN

## 2018-03-27 NOTE — Patient Instructions (Signed)
Medication Instructions:  Continue all current medications.  Labwork: none  Testing/Procedures: none  Follow-Up: 1 year   Any Other Special Instructions Will Be Listed Below (If Applicable). Remote monitoring is used to monitor your Pacemaker of ICD from home. This monitoring reduces the number of office visits required to check your device to one time per year. It allows [place] to keep an eye on the functioning of your device to ensure it is working properly. You are scheduled for a device check from home on 04/09/2018. You may send your transmission at any time that day. If you have a wireless device, the transmission will be sent automatically. After your physician reviews your transmission, you will receive a postcard with your next transmission date.  If you need a refill on your cardiac medications before your next appointment, please call your pharmacy.

## 2018-03-27 NOTE — Progress Notes (Signed)
PCP: Ignatius Specking, MD Primary Cardiologist: Dr Purvis Sheffield Primary EP: Dr Lisbeth Renshaw Tracy Mills is a 72 y.o. female who presents today for routine electrophysiology followup.  Since last being seen in our clinic, the patient reports doing very well.  Today, she denies symptoms of palpitations, chest pain, shortness of breath,  lower extremity edema, dizziness, presyncope, syncope, or ICD shocks.  The patient is otherwise without complaint today.   Past Medical History:  Diagnosis Date  . Chronic systolic congestive heart failure (HCC)   . Coronary artery disease    anterior MI in 2010. LAD stent placement 2010. EF: 15%  . Hypertension   . Ischemic cardiomyopathy   . LBBB (left bundle branch block)   . MR (mitral regurgitation)    moderate to severe  . Tobacco abuse   . VF (ventricular fibrillation) (HCC) 2013   nonsustained VF, spontaneously termianted   Past Surgical History:  Procedure Laterality Date  . BI-VENTRICULAR IMPLANTABLE CARDIOVERTER DEFIBRILLATOR UPGRADE  11-16-2013   upgrade of previously implanted dual chamber ICD to MDT Ovidio Kin XT CRTD by Dr Johney Frame  . BI-VENTRICULAR IMPLANTABLE CARDIOVERTER DEFIBRILLATOR UPGRADE N/A 11/16/2013   Procedure: BI-VENTRICULAR IMPLANTABLE CARDIOVERTER DEFIBRILLATOR UPGRADE;  Surgeon: Gardiner Rhyme, MD;  Location: [facility] CATH LAB;  Service: Cardiovascular;  Laterality: N/A;  . CARDIAC CATHETERIZATION  2010   LAD PCI and stent placement  . CARDIAC DEFIBRILLATOR PLACEMENT  06/14/09   MDT [place] Dr Johney Frame  . TONSILLECTOMY AND ADENOIDECTOMY    . TUBAL LIGATION      ROS- all systems are reviewed and negative except as per HPI above  Current Outpatient Medications  Medication Sig Dispense Refill  . acetaminophen (TYLENOL) 500 MG tablet Take 500 mg by mouth daily.    Marland Kitchen aspirin 81 MG tablet Take 81 mg by mouth daily.      . carvedilol (COREG) 3.125 MG tablet TAKE 1 TABLET (3.125 MG TOTAL) BY MOUTH 2 (TWO) TIMES DAILY. 180 tablet 3  .  furosemide (LASIX) 40 MG tablet TAKE 1 TABLET (40 MG TOTAL) BY MOUTH DAILY. 90 tablet 3  . nitroGLYCERIN (NITROSTAT) 0.4 MG SL tablet Place 1 tablet (0.4 mg total) every 5 (five) minutes as needed under the tongue for chest pain. 25 tablet 3  . rosuvastatin (CRESTOR) 20 MG tablet Take 1 tablet (20 mg total) by mouth daily. 30 tablet 6  . sacubitril-valsartan (ENTRESTO) 24-26 MG Take 1 tablet by mouth 2 (two) times daily. 180 tablet 3  . spironolactone (ALDACTONE) 25 MG tablet TAKE 1 TABLET (25 MG TOTAL) BY MOUTH DAILY. 90 tablet 3   No current facility-administered medications for this visit.     Physical Exam: Vitals:   03/27/18 1057  BP: 118/77  Pulse: 67  SpO2: 100%  Weight: 131 lb 12.8 oz (59.8 kg)  Height: 5\' 3"  (1.6 m)    GEN- The patient is well appearing, alert and oriented x 3 today.   Head- normocephalic, atraumatic Eyes-  Sclera clear, conjunctiva pink Ears- hearing intact Oropharynx- clear Lungs- Clear to ausculation bilaterally, normal work of breathing Chest- ICD pocket is well healed Heart- Regular rate and rhythm, no murmurs, rubs or gallops, PMI not laterally displaced GI- soft, NT, ND, + BS Extremities- no clubbing, cyanosis, or edema  ICD interrogation- reviewed in detail today,  See PACEART report  ekg tracing ordered today is personally reviewed and shows AV paced rhythm  Wt Readings from Last 3 Encounters:  03/27/18 131 lb 12.8 oz (59.8 kg)  08/12/17 139 lb (63 kg)  04/04/17 136 lb (61.7 kg)    Assessment and Plan:  1.  Chronic systolic dysfunction/ CAD/ ischemic CM euvolemic today Stable on an appropriate medical regimen Normal ICD function See Pace Art report No changes today followed in ICM device clinic  2. VT Well controlled  3. HTN Stable No change required today  4. Tobacco Cessation advised  Carelink Return in a year  Hillis Range MD, [facility] 03/27/2018 11:35 AM

## 2018-04-09 ENCOUNTER — Ambulatory Visit (INDEPENDENT_AMBULATORY_CARE_PROVIDER_SITE_OTHER): Payer: Medicare PPO | Admitting: *Deleted

## 2018-04-09 DIAGNOSIS — I255 Ischemic cardiomyopathy: Secondary | ICD-10-CM | POA: Diagnosis not present

## 2018-04-09 DIAGNOSIS — I5022 Chronic systolic (congestive) heart failure: Secondary | ICD-10-CM

## 2018-04-09 NOTE — Progress Notes (Signed)
Remote ICD transmission.   

## 2018-04-27 ENCOUNTER — Ambulatory Visit (INDEPENDENT_AMBULATORY_CARE_PROVIDER_SITE_OTHER): Payer: Medicare PPO

## 2018-04-27 DIAGNOSIS — Z95810 Presence of automatic (implantable) cardiac defibrillator: Secondary | ICD-10-CM

## 2018-04-27 DIAGNOSIS — I5022 Chronic systolic (congestive) heart failure: Secondary | ICD-10-CM | POA: Diagnosis not present

## 2018-04-28 ENCOUNTER — Telehealth: Payer: Self-pay

## 2018-04-28 NOTE — Progress Notes (Signed)
EPIC Encounter for ICM Monitoring  Patient Name: Tracy Mills is a 72 y.o. female Date: 04/28/2018 Primary Care Physican: [name], MD Primary [place] Electrophysiologist: Allred Bi-V Pacing: 97.1% Last Weight: 131 lbs Today's Weight:  unknown       Attempted call to patient and unable to reach.   Transmission reviewed.    Thoracic impedance abnormal suggesting fluid accumulation starting 04/23/2018.   Prescribed: Furosemide 40 mg 1 tablet daily  Labs: 08/21/2017 Creatinine 1.30, BUN 21, Potassium 4.3, Sodium 139, EGFR 40-49 07/11/2016 Creatinine 1.20, BUN 23, Potassium 4.1, Sodium 139, EGFR 46-53  Recommendations: Unable to reach.  Follow-up plan: ICM clinic phone appointment on 05/14/2018 to recheck fluid levels.      Copy of ICM check sent to Dr. [name] and Dr [name] for review.   3 month ICM trend: 04/27/2018    1 Year ICM trend:       Rosalene Billings, RN 04/28/2018 12:28 PM

## 2018-04-28 NOTE — Telephone Encounter (Signed)
Remote ICM transmission received.  Attempted call to patient regarding ICM remote transmission and no answer or answering machine. 

## 2018-05-14 ENCOUNTER — Ambulatory Visit (INDEPENDENT_AMBULATORY_CARE_PROVIDER_SITE_OTHER): Payer: Medicare PPO

## 2018-05-14 DIAGNOSIS — I5022 Chronic systolic (congestive) heart failure: Secondary | ICD-10-CM

## 2018-05-14 DIAGNOSIS — Z95810 Presence of automatic (implantable) cardiac defibrillator: Secondary | ICD-10-CM

## 2018-05-15 NOTE — Progress Notes (Signed)
EPIC Encounter for ICM Monitoring  Patient Name: Tracy Mills is a 72 y.o. female Date: 05/15/2018 Primary Care Physican: [name], MD Primary [place] Electrophysiologist: Allred Bi-V Pacing: 98.2% Last Weight: 131 lbs Today's Weight:  unknown                                                   Transmission reviewed.    Thoracic impedance trending close to baseline normal   Prescribed: Furosemide 40 mg 1 tablet daily  Labs: 08/21/2017 Creatinine 1.30, BUN 21, Potassium 4.3, Sodium 139, EGFR 40-49 07/11/2016 Creatinine 1.20, BUN 23, Potassium 4.1, Sodium 139, EGFR 46-53  Recommendations: None  Follow-up plan: ICM clinic phone appointment on 05/28/2018    Copy of ICM check sent to Dr. [name].  3 month ICM trend: 05/14/2018    1 Year ICM trend:       Rosalene Billings, RN 05/15/2018 4:21 PM

## 2018-05-19 ENCOUNTER — Other Ambulatory Visit: Payer: Self-pay | Admitting: *Deleted

## 2018-05-19 DIAGNOSIS — E785 Hyperlipidemia, unspecified: Secondary | ICD-10-CM

## 2018-05-19 MED ORDER — ROSUVASTATIN CALCIUM 20 MG PO TABS: 20.0000 mg | ORAL_TABLET | Freq: Every day | ORAL | 2 refills | Status: DC

## 2018-05-28 ENCOUNTER — Ambulatory Visit (INDEPENDENT_AMBULATORY_CARE_PROVIDER_SITE_OTHER): Payer: Medicare PPO

## 2018-05-28 DIAGNOSIS — Z95810 Presence of automatic (implantable) cardiac defibrillator: Secondary | ICD-10-CM | POA: Diagnosis not present

## 2018-05-28 DIAGNOSIS — I5022 Chronic systolic (congestive) heart failure: Secondary | ICD-10-CM

## 2018-05-29 NOTE — Progress Notes (Signed)
EPIC Encounter for ICM Monitoring  Patient Name: Tracy Mills is a 73 y.o. female Date: 05/29/2018 Primary Care Physican: [name], MD Primary [place] Electrophysiologist: Allred Bi-V Pacing: 98.2% Last Weight:131 lbs Today's Weight: unknown      Transmission received.   Thoracic impedance normal.  Prescribed dosage: Furosemide 40 mg 1 tablet daily  Labs: 08/21/2017 Creatinine 1.30, BUN 21, Potassium 4.3, Sodium 139, EGFR 40-49 07/11/2016 Creatinine 1.20, BUN 23, Potassium 4.1, Sodium 139, EGFR 46-53  Recommendations: None.  Follow-up plan: ICM clinic phone appointment on 06/29/2018.    Copy of ICM check sent to Dr. [name].   3 month ICM trend: 05/28/2018    1 Year ICM trend:       Rosalene Billings, RN 05/29/2018 9:04 AM

## 2018-06-08 LAB — CUP PACEART REMOTE DEVICE CHECK
Battery Remaining Longevity: 19 mo
Brady Statistic AS VS Percent: 0.16 %
Brady Statistic RA Percent Paced: 55.53 %
Date Time Interrogation Session: 20191114062204
HIGH POWER IMPEDANCE MEASURED VALUE: 46 Ohm
HIGH POWER IMPEDANCE MEASURED VALUE: 57 Ohm
Implantable Lead Implant Date: 20110119
Implantable Lead Implant Date: 20150623
Implantable Lead Location: 753858
Implantable Lead Location: 753859
Implantable Lead Location: 753860
Implantable Lead Model: 4598
Implantable Lead Model: 5076
Implantable Lead Model: 6947
Implantable Pulse Generator Implant Date: 20150623
Lead Channel Impedance Value: 1064 Ohm
Lead Channel Impedance Value: 1083 Ohm
Lead Channel Impedance Value: 399 Ohm
Lead Channel Impedance Value: 475 Ohm
Lead Channel Impedance Value: 513 Ohm
Lead Channel Impedance Value: 608 Ohm
Lead Channel Impedance Value: 988 Ohm
Lead Channel Pacing Threshold Amplitude: 0.5 V
Lead Channel Pacing Threshold Amplitude: 1.75 V
Lead Channel Pacing Threshold Pulse Width: 0.4 ms
Lead Channel Pacing Threshold Pulse Width: 0.8 ms
Lead Channel Setting Pacing Amplitude: 2.75 V
Lead Channel Setting Pacing Pulse Width: 0.4 ms
Lead Channel Setting Pacing Pulse Width: 0.8 ms
MDC IDC LEAD IMPLANT DT: 20110119
MDC IDC MSMT BATTERY VOLTAGE: 2.91 V
MDC IDC MSMT LEADCHNL LV IMPEDANCE VALUE: 361 Ohm
MDC IDC MSMT LEADCHNL LV IMPEDANCE VALUE: 456 Ohm
MDC IDC MSMT LEADCHNL LV IMPEDANCE VALUE: 703 Ohm
MDC IDC MSMT LEADCHNL LV IMPEDANCE VALUE: 703 Ohm
MDC IDC MSMT LEADCHNL LV IMPEDANCE VALUE: 722 Ohm
MDC IDC MSMT LEADCHNL RA IMPEDANCE VALUE: 475 Ohm
MDC IDC MSMT LEADCHNL RA SENSING INTR AMPL: 2.5 mV
MDC IDC MSMT LEADCHNL RA SENSING INTR AMPL: 2.5 mV
MDC IDC MSMT LEADCHNL RV PACING THRESHOLD AMPLITUDE: 1.125 V
MDC IDC MSMT LEADCHNL RV PACING THRESHOLD PULSEWIDTH: 0.4 ms
MDC IDC MSMT LEADCHNL RV SENSING INTR AMPL: 25 mV
MDC IDC MSMT LEADCHNL RV SENSING INTR AMPL: 25 mV
MDC IDC SET LEADCHNL RA PACING AMPLITUDE: 2 V
MDC IDC SET LEADCHNL RV PACING AMPLITUDE: 2.5 V
MDC IDC SET LEADCHNL RV SENSING SENSITIVITY: 0.3 mV
MDC IDC STAT BRADY AP VP PERCENT: 55.62 %
MDC IDC STAT BRADY AP VS PERCENT: 0.04 %
MDC IDC STAT BRADY AS VP PERCENT: 44.17 %
MDC IDC STAT BRADY RV PERCENT PACED: 97.07 %

## 2018-06-29 ENCOUNTER — Ambulatory Visit (INDEPENDENT_AMBULATORY_CARE_PROVIDER_SITE_OTHER): Payer: Medicare PPO

## 2018-06-29 DIAGNOSIS — I5022 Chronic systolic (congestive) heart failure: Secondary | ICD-10-CM

## 2018-06-29 DIAGNOSIS — Z95810 Presence of automatic (implantable) cardiac defibrillator: Secondary | ICD-10-CM

## 2018-06-30 ENCOUNTER — Telehealth: Payer: Self-pay

## 2018-06-30 NOTE — Progress Notes (Signed)
EPIC Encounter for ICM Monitoring  Patient Name: Tracy Mills is a 73 y.o. female Date: 06/30/2018 Primary Care Physican: [name], MD Primary [place] Electrophysiologist: Allred Bi-V Pacing: 95.2% Last Weight:131 lbs Today's Weight: unknown     Clinical Status (28-May-2018 to 30-Jun-2018) Treated VT/VF 0  AT/AF 1 episode Time in AT/AF <0.1 hr/day (<0.1%)                                               Attempted call to patient and unable to reach.  Transmission reviewed.    Thoracic impedance normal.  Prescribed dosage: Furosemide 40 mg 1 tablet daily  Labs: 08/21/2017 Creatinine 1.30, BUN 21, Potassium 4.3, Sodium 139, EGFR 40-49 07/11/2016 Creatinine 1.20, BUN 23, Potassium 4.1, Sodium 139, EGFR 46-53  Recommendations: Unable to reach.  Follow-up plan: ICM clinic phone appointment on 08/03/2018.    Copy of ICM check sent to Dr. [name].   3 month ICM trend: 06/30/2018    1 Year ICM trend:       Rosalene Billings, RN 06/30/2018 3:30 PM

## 2018-06-30 NOTE — Telephone Encounter (Signed)
Remote ICM transmission received.  Attempted call to patient regarding ICM remote transmission and no answer or voice mail box. 

## 2018-07-09 ENCOUNTER — Ambulatory Visit (INDEPENDENT_AMBULATORY_CARE_PROVIDER_SITE_OTHER): Payer: 59

## 2018-07-09 DIAGNOSIS — I255 Ischemic cardiomyopathy: Secondary | ICD-10-CM

## 2018-07-10 LAB — CUP PACEART REMOTE DEVICE CHECK
Battery Remaining Longevity: 20 mo
Brady Statistic AP VP Percent: 52.93 %
Brady Statistic AS VS Percent: 0.23 %
Date Time Interrogation Session: 20200214083432
HIGH POWER IMPEDANCE MEASURED VALUE: 43 Ohm
HighPow Impedance: 55 Ohm
Implantable Lead Implant Date: 20110119
Implantable Lead Implant Date: 20110119
Implantable Lead Location: 753858
Implantable Lead Location: 753859
Implantable Lead Location: 753860
Implantable Lead Model: 4598
Implantable Lead Model: 5076
Implantable Pulse Generator Implant Date: 20150623
Lead Channel Impedance Value: 1026 Ohm
Lead Channel Impedance Value: 1064 Ohm
Lead Channel Impedance Value: 361 Ohm
Lead Channel Impedance Value: 456 Ohm
Lead Channel Impedance Value: 608 Ohm
Lead Channel Impedance Value: 931 Ohm
Lead Channel Pacing Threshold Amplitude: 0.5 V
Lead Channel Pacing Threshold Amplitude: 1.75 V
Lead Channel Pacing Threshold Pulse Width: 0.4 ms
Lead Channel Pacing Threshold Pulse Width: 0.8 ms
Lead Channel Sensing Intrinsic Amplitude: 23.375 mV
Lead Channel Setting Pacing Amplitude: 2 V
Lead Channel Setting Sensing Sensitivity: 0.3 mV
MDC IDC LEAD IMPLANT DT: 20150623
MDC IDC MSMT BATTERY VOLTAGE: 2.91 V
MDC IDC MSMT LEADCHNL LV IMPEDANCE VALUE: 342 Ohm
MDC IDC MSMT LEADCHNL LV IMPEDANCE VALUE: 475 Ohm
MDC IDC MSMT LEADCHNL LV IMPEDANCE VALUE: 513 Ohm
MDC IDC MSMT LEADCHNL LV IMPEDANCE VALUE: 665 Ohm
MDC IDC MSMT LEADCHNL LV IMPEDANCE VALUE: 703 Ohm
MDC IDC MSMT LEADCHNL LV IMPEDANCE VALUE: 760 Ohm
MDC IDC MSMT LEADCHNL RA SENSING INTR AMPL: 1.75 mV
MDC IDC MSMT LEADCHNL RA SENSING INTR AMPL: 1.75 mV
MDC IDC MSMT LEADCHNL RV IMPEDANCE VALUE: 456 Ohm
MDC IDC MSMT LEADCHNL RV PACING THRESHOLD AMPLITUDE: 1.125 V
MDC IDC MSMT LEADCHNL RV PACING THRESHOLD PULSEWIDTH: 0.4 ms
MDC IDC MSMT LEADCHNL RV SENSING INTR AMPL: 23.375 mV
MDC IDC SET LEADCHNL LV PACING AMPLITUDE: 2.75 V
MDC IDC SET LEADCHNL LV PACING PULSEWIDTH: 0.8 ms
MDC IDC SET LEADCHNL RV PACING AMPLITUDE: 2.5 V
MDC IDC SET LEADCHNL RV PACING PULSEWIDTH: 0.4 ms
MDC IDC STAT BRADY AP VS PERCENT: 0.04 %
MDC IDC STAT BRADY AS VP PERCENT: 46.79 %
MDC IDC STAT BRADY RA PERCENT PACED: 52.68 %
MDC IDC STAT BRADY RV PERCENT PACED: 97.72 %

## 2018-07-21 NOTE — Progress Notes (Signed)
Remote ICD transmission.   

## 2018-08-03 ENCOUNTER — Ambulatory Visit (INDEPENDENT_AMBULATORY_CARE_PROVIDER_SITE_OTHER): Payer: Medicare PPO

## 2018-08-03 DIAGNOSIS — I5022 Chronic systolic (congestive) heart failure: Secondary | ICD-10-CM

## 2018-08-03 DIAGNOSIS — Z95810 Presence of automatic (implantable) cardiac defibrillator: Secondary | ICD-10-CM

## 2018-08-03 NOTE — Progress Notes (Signed)
Received: Today  Message Contents  Laqueta Linden, MD  Peggyann Zwiefelhofer, Josephine Igo, RN        I agree with your plan of simply following up with her on 3/23.

## 2018-08-03 NOTE — Progress Notes (Signed)
EPIC Encounter for ICM Monitoring  Patient Name: ARABEL BARCENAS is a 73 y.o. female Date: 08/03/2018 Primary Care Physican: [name], MD Primary [place] Electrophysiologist: Allred Bi-V Pacing: 97% Last Weight:131 lbs Today's Weight: 135 lbs     Spoke with patient.  She has a chest and head cold for the past few days.  She is not feeling well due to the cold.  She denies any fluid symptoms.   Thoracic impedance abnormal suggesting fluid accumulation since 07/28/2018.  Prescribed dosage:Furosemide 40 mg 1 tablet daily  Labs: 08/21/2017 Creatinine 1.30, BUN 21, Potassium 4.3, Sodium 139, EGFR 40-49 07/11/2016 Creatinine 1.20, BUN 23, Potassium 4.1, Sodium 139, EGFR 46-53  Recommendations: No changes and encouraged to call if experiences any fluid symptoms.  Follow-up plan: ICM clinic phone appointment on3/23/2020 to recheck fluid levels.   Copy of ICM check sent to Dr.Allred and Dr [name] for review and if any recommendations will call back.    3 month ICM trend: 08/03/2018    1 Year ICM trend:       Rosalene Billings, RN 08/03/2018 10:30 AM

## 2018-08-17 ENCOUNTER — Other Ambulatory Visit: Payer: Self-pay

## 2018-08-17 ENCOUNTER — Telehealth: Payer: Self-pay | Admitting: *Deleted

## 2018-08-17 ENCOUNTER — Ambulatory Visit (INDEPENDENT_AMBULATORY_CARE_PROVIDER_SITE_OTHER): Payer: Medicare PPO

## 2018-08-17 DIAGNOSIS — Z95810 Presence of automatic (implantable) cardiac defibrillator: Secondary | ICD-10-CM

## 2018-08-17 DIAGNOSIS — I5022 Chronic systolic (congestive) heart failure: Secondary | ICD-10-CM

## 2018-08-17 NOTE — Telephone Encounter (Signed)
   Primary Cardiologist:  Prentice Docker, MD   Patient contacted.  History reviewed.  No symptoms to suggest any unstable cardiac conditions.  Based on discussion, with current pandemic situation, we will be postponing this appointment for 12/10/2018.  If symptoms change, she has been instructed to contact our office.  Patient did state that she had a cough, felt like she had a sinus infection.  No fever, exposure to anyone with virus, & no travel internationally.  Suggested that she follow up with her pcp regarding symptoms.  She verbalized understanding.    Marland Kitchen

## 2018-08-19 NOTE — Progress Notes (Signed)
EPIC Encounter for ICM Monitoring  Patient Name: Tracy Mills is a 73 y.o. female Date: 08/19/2018 Primary Care Physican: [name], MD Primary [place] Electrophysiologist: Allred Bi-V Pacing: 97.7% Last Weight:131 lbs 08/03/2018 Weight: 135 lbs 08/19/2018 Weight: 132 lbs  Spoke with patient.  She reported her cold has improved but she still has a cough, no fever and no shortness of breath.     Thoracic impedance returned to normal since 08/03/2018 transmission.  Prescribed dosage:Furosemide 40 mg 1 tablet daily  Labs: 08/21/2017 Creatinine 1.30, BUN 21, Potassium 4.3, Sodium 139, EGFR 40-49 07/11/2016 Creatinine 1.20, BUN 23, Potassium 4.1, Sodium 139, EGFR 46-53  Recommendations: No changes and encouraged to call if experiences any fluid symptoms.  Follow-up plan: ICM clinic phone appointment on4/13/2020.   Copy of ICM check sent to Dr.Allred and Dr [name] (follow up on previous abnormal report).  3 month ICM trend: 08/17/2018    1 Year ICM trend:       Rosalene Billings, RN 08/19/2018 8:28 AM

## 2018-08-20 ENCOUNTER — Ambulatory Visit: Payer: Medicare PPO | Admitting: Cardiovascular Disease

## 2018-09-07 ENCOUNTER — Ambulatory Visit (INDEPENDENT_AMBULATORY_CARE_PROVIDER_SITE_OTHER): Payer: Medicare PPO

## 2018-09-07 ENCOUNTER — Other Ambulatory Visit: Payer: Self-pay

## 2018-09-07 DIAGNOSIS — I5022 Chronic systolic (congestive) heart failure: Secondary | ICD-10-CM | POA: Diagnosis not present

## 2018-09-07 DIAGNOSIS — Z95810 Presence of automatic (implantable) cardiac defibrillator: Secondary | ICD-10-CM | POA: Diagnosis not present

## 2018-09-08 MED ORDER — POTASSIUM CHLORIDE CRYS ER 20 MEQ PO TBCR: 20.0000 meq | EXTENDED_RELEASE_TABLET | Freq: Every day | ORAL | 3 refills | Status: DC

## 2018-09-08 NOTE — Progress Notes (Signed)
EPIC Encounter for ICM Monitoring  Patient Name: Tracy Mills is a 73 y.o. female Date: 09/08/2018 Primary Care Physican: [name], MD Primary [place] Electrophysiologist: Allred Bi-V Pacing: 94.1% 08/03/2018 Weight: 135 lbs 08/19/2018 Weight: 132 lbs 09/08/2018 Weight: unknown, not weighing at home.  Spoke with patient. She reports she still has a cough when lying down at night for past month. She reports she did have a sinus infection congestion but feels that has resolved.         Advised the cough may be a symptom of fluid accumulation.   Thoracic impedanceabnormal suggesting fluid accumulation since 08/25/2018.  Prescribed dosage:Furosemide 40 mg 1 tablet daily  Labs: 08/21/2017 Creatinine 1.30, BUN 21, Potassium 4.3, Sodium 139, EGFR 40-49 07/11/2016 Creatinine 1.20, BUN 23, Potassium 4.1, Sodium 139, EGFR 46-53  Recommendations: Advised will send coy of report to Dr [name] for recommendation if needed for fluid accumulation and patient would like to know what medication or cough syrup to take at night for the cough.  Follow-up plan: ICM clinic phone appointment on4/20/2020 (manual send) to recheck fluid levels.   Copy of ICM check sent to Dr.Allred and Dr [name] for review and recommendations if needed.   3 month ICM trend: 09/08/2018    1 Year ICM trend:       Rosalene Billings, RN 09/08/2018 9:11 AM

## 2018-09-08 NOTE — Progress Notes (Signed)
Increase Lasix to 40 mg bid x 3 days, then back to 40 mg daily. This may help assist with cough resolution as well. Take KCl 20 meq daily. Check BMET in 3 days.

## 2018-09-08 NOTE — Progress Notes (Signed)
Call to patient.  Advised Dr Purvis Sheffield ordered to Increase Lasix to 40 mg bid x 3 days, then back to 40 mg daily. He thought this may resolve cough as well. Take KCl 20 meq daily. Check BMET in 3 days. BMET order faxed to patient's choice of [facility].  Fax number [phone].  Advised to call if she has any questions or condition worsens.  Potassium prescription sent to [facility] in [place] as requested.

## 2018-09-11 DIAGNOSIS — I5022 Chronic systolic (congestive) heart failure: Secondary | ICD-10-CM | POA: Diagnosis not present

## 2018-09-14 ENCOUNTER — Ambulatory Visit (INDEPENDENT_AMBULATORY_CARE_PROVIDER_SITE_OTHER): Payer: Medicare PPO

## 2018-09-14 ENCOUNTER — Other Ambulatory Visit: Payer: Self-pay

## 2018-09-14 DIAGNOSIS — Z95810 Presence of automatic (implantable) cardiac defibrillator: Secondary | ICD-10-CM

## 2018-09-14 DIAGNOSIS — I5022 Chronic systolic (congestive) heart failure: Secondary | ICD-10-CM

## 2018-09-16 ENCOUNTER — Telehealth: Payer: Self-pay | Admitting: *Deleted

## 2018-09-16 NOTE — Telephone Encounter (Signed)
Notes recorded by Lesle Chris, LPN on 4/94/4967 at 8:35 AM EDT Patient notified. Copy to pmd. ------  Notes recorded by Laqueta Linden, MD on 09/13/2018 at 5:04 PM EDT Labs are stable.

## 2018-09-16 NOTE — Progress Notes (Signed)
EPIC Encounter for ICM Monitoring  Patient Name: Tracy Mills is a 73 y.o. female Date: 09/16/2018 Primary Care Physican: Ignatius Specking, MD Primary Cardiologist:[place] Electrophysiologist: Allred Bi-V Pacing: 94.1% 3/09/2020Weight: 135 lbs 08/19/2018 Weight:132lbs Not weighing at home.  Spoke with patient. Shereports the night coughing did not change but report suggests she still has fluid accumulation.  She reports urine output did increase after taking extra Lasix.        She thinks the cough may be related to since congestion.   Thoracic impedanceremained abnormalsuggesting fluid accumulation after taking an increase Lasix dosage of 40 mg bid x 3 days.  Prescribed dosage:Furosemide 40 mg 1 tablet daily  Labs: 09/11/2018 Creatinine 1.19, BUN 16, Potassium 4.6, Sodium 135, GFR 45-54 08/21/2017 Creatinine 1.30, BUN 21, Potassium 4.3, Sodium 139, GFR 40-49 07/11/2016 Creatinine 1.20, BUN 23, Potassium 4.1, Sodium 139, GFR 46-53  Recommendations: Advised will send copy of report to Dr Purvis Sheffield for recommendations on the following if needed.   1. Pt still has night cough. 2. Report suggested no impedance and suggests she continues to have fluid accumulation even after taking Lasix 40 mg bid.  3. 4/17 BMET results showed Potassium 4.6 and she had not taken any Potassium when lab was drawn because she was unable to swallow pill.  If continuing potassium, okay to change to 10 mEq 2 tablets in place of 20 mEq tablets.    Follow-up plan: ICM clinic phone appointment on4/28/2020 (manual send) to recheck fluid levels.   Copy of ICM check sent to Dr.Allredand Dr Purvis Sheffield.   3 month ICM trend: 09/14/2018    1 Year ICM trend:       [name], RN 09/16/2018 10:06 AM

## 2018-09-16 NOTE — Progress Notes (Signed)
Clarified by Burman Nieves, CMA with Dr Purvis Sheffield and he recommended pt does need to to start the potassium.  Potassium prescription discontinued.  Call to patient.  Advised Dr Purvis Sheffield ordered to Increase Lasix to 80 mg bid x 3 days, then back to 40 mg daily. Advised pt not to start the potassium prescription.  Advised to have BMET drawn on 4/24. BMET order faxed to patient's choice of [facility].  Fax number [phone].  Advised to call if she has any questions or condition worsens.

## 2018-09-16 NOTE — Progress Notes (Signed)
Increase Lasix to 80 mg bid x 3 days and check BMET this Friday.

## 2018-09-22 ENCOUNTER — Telehealth: Payer: Self-pay

## 2018-09-22 ENCOUNTER — Other Ambulatory Visit: Payer: Self-pay

## 2018-09-22 NOTE — Telephone Encounter (Signed)
Left message for patient to remind of missed remote transmission.  

## 2018-09-25 NOTE — Progress Notes (Signed)
No ICM remote transmission received for 09/22/2018 and next ICM transmission scheduled for 10/12/2018.   

## 2018-10-08 ENCOUNTER — Encounter: Payer: Medicare PPO | Admitting: *Deleted

## 2018-10-08 ENCOUNTER — Other Ambulatory Visit: Payer: Self-pay

## 2018-10-09 ENCOUNTER — Telehealth: Payer: Self-pay

## 2018-10-09 NOTE — Telephone Encounter (Signed)
Left message for patient to remind of missed remote transmission.  

## 2018-10-12 ENCOUNTER — Other Ambulatory Visit: Payer: Self-pay

## 2018-10-12 ENCOUNTER — Ambulatory Visit (INDEPENDENT_AMBULATORY_CARE_PROVIDER_SITE_OTHER): Payer: Medicare PPO

## 2018-10-12 DIAGNOSIS — I5022 Chronic systolic (congestive) heart failure: Secondary | ICD-10-CM

## 2018-10-12 DIAGNOSIS — Z95810 Presence of automatic (implantable) cardiac defibrillator: Secondary | ICD-10-CM | POA: Diagnosis not present

## 2018-10-13 ENCOUNTER — Telehealth: Payer: Self-pay

## 2018-10-13 NOTE — Telephone Encounter (Signed)
Spoke with patient to remind of missed remote transmission 

## 2018-10-15 ENCOUNTER — Ambulatory Visit (INDEPENDENT_AMBULATORY_CARE_PROVIDER_SITE_OTHER): Payer: Medicare PPO | Admitting: *Deleted

## 2018-10-15 DIAGNOSIS — I255 Ischemic cardiomyopathy: Secondary | ICD-10-CM

## 2018-10-16 LAB — CUP PACEART REMOTE DEVICE CHECK
Battery Remaining Longevity: 15 mo
Battery Voltage: 2.89 V
Brady Statistic AP VP Percent: 50.79 %
Brady Statistic AP VS Percent: 0.05 %
Brady Statistic AS VP Percent: 48.77 %
Brady Statistic AS VS Percent: 0.39 %
Brady Statistic RA Percent Paced: 50.1 %
Brady Statistic RV Percent Paced: 94.91 %
Date Time Interrogation Session: 20200519143132
HighPow Impedance: 45 Ohm
HighPow Impedance: 55 Ohm
Implantable Lead Implant Date: 20110119
Implantable Lead Implant Date: 20110119
Implantable Lead Implant Date: 20150623
Implantable Lead Location: 753858
Implantable Lead Location: 753859
Implantable Lead Location: 753860
Implantable Lead Model: 4598
Implantable Lead Model: 5076
Implantable Lead Model: 6947
Implantable Pulse Generator Implant Date: 20150623
Lead Channel Impedance Value: 304 Ohm
Lead Channel Impedance Value: 399 Ohm
Lead Channel Impedance Value: 418 Ohm
Lead Channel Impedance Value: 456 Ohm
Lead Channel Impedance Value: 456 Ohm
Lead Channel Impedance Value: 475 Ohm
Lead Channel Impedance Value: 551 Ohm
Lead Channel Impedance Value: 589 Ohm
Lead Channel Impedance Value: 646 Ohm
Lead Channel Impedance Value: 703 Ohm
Lead Channel Impedance Value: 874 Ohm
Lead Channel Impedance Value: 931 Ohm
Lead Channel Impedance Value: 931 Ohm
Lead Channel Pacing Threshold Amplitude: 0.5 V
Lead Channel Pacing Threshold Amplitude: 1 V
Lead Channel Pacing Threshold Amplitude: 1.875 V
Lead Channel Pacing Threshold Pulse Width: 0.4 ms
Lead Channel Pacing Threshold Pulse Width: 0.4 ms
Lead Channel Pacing Threshold Pulse Width: 0.8 ms
Lead Channel Sensing Intrinsic Amplitude: 1.5 mV
Lead Channel Sensing Intrinsic Amplitude: 1.5 mV
Lead Channel Sensing Intrinsic Amplitude: 23.625 mV
Lead Channel Sensing Intrinsic Amplitude: 23.625 mV
Lead Channel Setting Pacing Amplitude: 2 V
Lead Channel Setting Pacing Amplitude: 2.5 V
Lead Channel Setting Pacing Amplitude: 3 V
Lead Channel Setting Pacing Pulse Width: 0.4 ms
Lead Channel Setting Pacing Pulse Width: 0.8 ms
Lead Channel Setting Sensing Sensitivity: 0.3 mV

## 2018-10-16 NOTE — Progress Notes (Signed)
EPIC Encounter for ICM Monitoring  Patient Name: Tracy Mills is a 73 y.o. female Date: 10/16/2018 Primary Care Physican: Ignatius Specking, MD Primary Cardiologist:K[place]waran Electrophysiologist: Allred Bi-V Pacing: 94.9% 08/19/2018 Weight:132lbs Not weighing at home.   Transmission reviewed.    Optivol Thoracic impedancenormal.  Prescribed dosage:Furosemide 40 mg 1 tablet daily  Labs: 09/11/2018 Creatinine 1.19, BUN 16, Potassium 4.6, Sodium 135, GFR 45-54 08/21/2017 Creatinine 1.30, BUN 21, Potassium 4.3, Sodium 139, GFR 40-49 07/11/2016 Creatinine 1.20, BUN 23, Potassium 4.1, Sodium 139, GFR 46-53  Follow-up plan: ICM clinic phone appointment on6/22/2020  Copy of ICM check sent to Dr.Allred.   3 month ICM trend: 10/13/2018    1 Year ICM trend:       [name], RN 10/16/2018 10:47 AM

## 2018-10-26 ENCOUNTER — Encounter: Payer: Self-pay | Admitting: Cardiology

## 2018-10-26 NOTE — Progress Notes (Signed)
Remote ICD transmission.   

## 2018-11-12 ENCOUNTER — Other Ambulatory Visit: Payer: Self-pay | Admitting: Cardiovascular Disease

## 2018-11-12 DIAGNOSIS — E785 Hyperlipidemia, unspecified: Secondary | ICD-10-CM

## 2018-11-12 DIAGNOSIS — I5022 Chronic systolic (congestive) heart failure: Secondary | ICD-10-CM

## 2018-11-16 ENCOUNTER — Ambulatory Visit (INDEPENDENT_AMBULATORY_CARE_PROVIDER_SITE_OTHER): Payer: Medicare PPO

## 2018-11-16 DIAGNOSIS — I5022 Chronic systolic (congestive) heart failure: Secondary | ICD-10-CM

## 2018-11-16 DIAGNOSIS — Z95810 Presence of automatic (implantable) cardiac defibrillator: Secondary | ICD-10-CM

## 2018-11-16 NOTE — Progress Notes (Signed)
EPIC Encounter for ICM Monitoring  Patient Name: Tracy Mills is a 73 y.o. female Date: 11/16/2018 Primary Care Physican: [name], MD Primary [place] Electrophysiologist: Allred Bi-V Pacing: 93.6% 08/19/2018 Weight:132lbs Not weighing at home.   Transmission reviewed. Spoke with patient. She denies any fluid symptoms.  She said her brother passed away this month and she has been out of her routine of the foods she eats.    Optivol Thoracic impedanceabnormal suggesting possible fluid accumulation since 10/28/2018 but trending toward baseline normal.  TakesFurosemide 40 mg 1 tablet daily  Labs: 09/11/2018 Creatinine 1.19, BUN 16, Potassium 4.6, Sodium 135, GFR 45-54 08/21/2017 Creatinine 1.30, BUN 21, Potassium 4.3, Sodium 139, GFR 40-49 07/11/2016 Creatinine 1.20, BUN 23, Potassium 4.1, Sodium 139, GFR 46-53  Recommendations:  Patient said she will take extra Furosemide tablet x 2 days and then return to prescribed dosage.    Follow-up plan: ICM clinic phone appointment on7/05/2018 (manual send) to recheck fluid levels.  Copy of ICM check sent to Dr.Allred and Dr [name] for review.  3 month ICM trend: 11/16/2018    1 Year ICM trend:       Rosalene Billings, RN 11/16/2018 12:02 PM

## 2018-11-24 DIAGNOSIS — Z0000 Encounter for general adult medical examination without abnormal findings: Secondary | ICD-10-CM | POA: Diagnosis not present

## 2018-11-24 DIAGNOSIS — Z7189 Other specified counseling: Secondary | ICD-10-CM | POA: Diagnosis not present

## 2018-11-24 DIAGNOSIS — Z6822 Body mass index (BMI) 22.0-22.9, adult: Secondary | ICD-10-CM | POA: Diagnosis not present

## 2018-11-24 DIAGNOSIS — Z1339 Encounter for screening examination for other mental health and behavioral disorders: Secondary | ICD-10-CM | POA: Diagnosis not present

## 2018-11-24 DIAGNOSIS — Z1331 Encounter for screening for depression: Secondary | ICD-10-CM | POA: Diagnosis not present

## 2018-11-24 DIAGNOSIS — Z1211 Encounter for screening for malignant neoplasm of colon: Secondary | ICD-10-CM | POA: Diagnosis not present

## 2018-11-24 DIAGNOSIS — I10 Essential (primary) hypertension: Secondary | ICD-10-CM | POA: Diagnosis not present

## 2018-11-24 DIAGNOSIS — Z79899 Other long term (current) drug therapy: Secondary | ICD-10-CM | POA: Diagnosis not present

## 2018-11-24 DIAGNOSIS — I509 Heart failure, unspecified: Secondary | ICD-10-CM | POA: Diagnosis not present

## 2018-11-24 DIAGNOSIS — E7800 Pure hypercholesterolemia, unspecified: Secondary | ICD-10-CM | POA: Diagnosis not present

## 2018-11-24 DIAGNOSIS — Z299 Encounter for prophylactic measures, unspecified: Secondary | ICD-10-CM | POA: Diagnosis not present

## 2018-11-25 ENCOUNTER — Telehealth: Payer: Self-pay

## 2018-11-25 NOTE — Telephone Encounter (Signed)
Left message for patient to remind of missed remote transmission.  

## 2018-11-30 NOTE — Progress Notes (Signed)
No ICM remote transmission received for 11/25/2018 and next ICM transmission scheduled for 12/21/2018.

## 2018-12-03 ENCOUNTER — Telehealth: Payer: Self-pay | Admitting: Cardiovascular Disease

## 2018-12-03 NOTE — Telephone Encounter (Signed)
Virtual Visit Pre-Appointment Phone Call  "(Name), I am calling you today to discuss your upcoming appointment. We are currently trying to limit exposure to the virus that causes COVID-19 by seeing patients at home rather than in the office."  1. "What is the BEST phone number to call the day of the visit?" - include this in appointment notes  2. Do you have or have access to (through a family member/friend) a smartphone with video capability that we can use for your visit?" a. If yes - list this number in appt notes as cell (if different from BEST phone #) and list the appointment type as a VIDEO visit in appointment notes b. If no - list the appointment type as a PHONE visit in appointment notes  3. Confirm consent - "In the setting of the current Covid19 crisis, you are scheduled for a (phone or video) visit with your provider on (date) at (time).  Just as we do with many in-office visits, in order for you to participate in this visit, we must obtain consent.  If you'd like, I can send this to your mychart (if signed up) or email for you to review.  Otherwise, I can obtain your verbal consent now.  All virtual visits are billed to your insurance company just like a normal visit would be.  By agreeing to a virtual visit, we'd like you to understand that the technology does not allow for your provider to perform an examination, and thus may limit your provider's ability to fully assess your condition. If your provider identifies any concerns that need to be evaluated in person, we will make arrangements to do so.  Finally, though the technology is pretty good, we cannot assure that it will always work on either your or our end, and in the setting of a video visit, we may have to convert it to a phone-only visit.  In either situation, we cannot ensure that we have a secure connection.  Are you willing to proceed?" STAFF: Did the patient verbally acknowledge consent to telehealth visit? Document  YES/NO here: yes  4. Advise patient to be prepared - "Two hours prior to your appointment, go ahead and check your blood pressure, pulse, oxygen saturation, and your weight (if you have the equipment to check those) and write them all down. When your visit starts, your provider will ask you for this information. If you have an Apple Watch or Kardia device, please plan to have heart rate information ready on the day of your appointment. Please have a pen and paper handy nearby the day of the visit as well."  5. Give patient instructions for MyChart download to smartphone OR Doximity/Doxy.me as below if video visit (depending on what platform provider is using)  6. Inform patient they will receive a phone call 15 minutes prior to their appointment time (may be from unknown caller ID) so they should be prepared to answer    [name] has been deemed a candidate for a follow-up tele-health visit to limit community exposure during the Covid-19 pandemic. I spoke with the patient via phone to ensure availability of phone/video source, confirm preferred email & phone number, and discuss instructions and expectations.  I reminded Tracy Mills to be prepared with any vital sign and/or heart rhythm information that could potentially be obtained via home monitoring, at the time of her visit. I reminded Tracy Mills to expect a phone call prior to  her visit.  Tracy Mills 12/03/2018 3:05 PM   INSTRUCTIONS FOR DOWNLOADING THE MYCHART APP TO SMARTPHONE  - The patient must first make sure to have activated MyChart and know their login information - If Apple, go to [employer] and type in MyChart in the search bar and download the app. If Android, ask patient to go to [employer] and type in [name] in the search bar and download the app. The app is free but as with any other app downloads, their phone may require them to verify saved payment information or Apple/Android  password.  - The patient will need to then log into the app with their MyChart username and password, and select [facility] as their healthcare provider to link the account. When it is time for your visit, go to the MyChart app, find appointments, and click Begin Video Visit. Be sure to Select Allow for your device to access the Microphone and Camera for your visit. You will then be connected, and your provider will be with you shortly.  **If they have any issues connecting, or need assistance please contact MyChart service desk [phone])**  **If using a computer, in order to ensure the best quality for their visit they will need to use either of the following Internet Browsers: [employer] or Google Chrome**  IF USING DOXIMITY or DOXY.ME - The patient will receive a link just prior to their visit by text.     FULL LENGTH CONSENT FOR TELE-HEALTH VISIT   I hereby voluntarily request, consent and authorize [facility] and its employed or contracted physicians, physician assistants, nurse practitioners or other licensed health care professionals (the Practitioner), to provide me with telemedicine health care services (the Services") as deemed necessary by the treating Practitioner. I acknowledge and consent to receive the Services by the Practitioner via telemedicine. I understand that the telemedicine visit will involve communicating with the Practitioner through live audiovisual communication technology and the disclosure of certain medical information by electronic transmission. I acknowledge that I have been given the opportunity to request an in-person assessment or other available alternative prior to the telemedicine visit and am voluntarily participating in the telemedicine visit.  I understand that I have the right to withhold or withdraw my consent to the use of telemedicine in the course of my care at any time, without affecting my right to future care or treatment,  and that the Practitioner or I may terminate the telemedicine visit at any time. I understand that I have the right to inspect all information obtained and/or recorded in the course of the telemedicine visit and may receive copies of available information for a reasonable fee.  I understand that some of the potential risks of receiving the Services via telemedicine include:   Delay or interruption in medical evaluation due to technological equipment failure or disruption;  Information transmitted may not be sufficient (e.g. poor resolution of images) to allow for appropriate medical decision making by the Practitioner; and/or   In rare instances, security protocols could fail, causing a breach of personal health information.  Furthermore, I acknowledge that it is my responsibility to provide information about my medical history, conditions and care that is complete and accurate to the best of my ability. I acknowledge that Practitioner's advice, recommendations, and/or decision may be based on factors not within their control, such as incomplete or inaccurate data provided by me or distortions of diagnostic images or specimens that may result from electronic transmissions. I  understand that the practice of medicine is not an exact science and that Practitioner makes no warranties or guarantees regarding treatment outcomes. I acknowledge that I will receive a copy of this consent concurrently upon execution via email to the email address I last provided but may also request a printed copy by calling the office of [place].    I understand that my insurance will be billed for this visit.   I have read or had this consent read to me.  I understand the contents of this consent, which adequately explains the benefits and risks of the Services being provided via telemedicine.   I have been provided ample opportunity to ask questions regarding this consent and the Services and have had my questions  answered to my satisfaction.  I give my informed consent for the services to be provided through the use of telemedicine in my medical care  By participating in this telemedicine visit I agree to the above.

## 2018-12-10 ENCOUNTER — Telehealth (INDEPENDENT_AMBULATORY_CARE_PROVIDER_SITE_OTHER): Payer: Medicare PPO | Admitting: Cardiovascular Disease

## 2018-12-10 ENCOUNTER — Encounter: Payer: Self-pay | Admitting: Cardiovascular Disease

## 2018-12-10 VITALS — Ht 63.0 in | Wt 124.0 lb

## 2018-12-10 DIAGNOSIS — I472 Ventricular tachycardia, unspecified: Secondary | ICD-10-CM

## 2018-12-10 DIAGNOSIS — Z95810 Presence of automatic (implantable) cardiac defibrillator: Secondary | ICD-10-CM

## 2018-12-10 DIAGNOSIS — I519 Heart disease, unspecified: Secondary | ICD-10-CM

## 2018-12-10 DIAGNOSIS — I5042 Chronic combined systolic (congestive) and diastolic (congestive) heart failure: Secondary | ICD-10-CM

## 2018-12-10 DIAGNOSIS — E785 Hyperlipidemia, unspecified: Secondary | ICD-10-CM

## 2018-12-10 DIAGNOSIS — N183 Chronic kidney disease, stage 3 unspecified: Secondary | ICD-10-CM

## 2018-12-10 DIAGNOSIS — I10 Essential (primary) hypertension: Secondary | ICD-10-CM

## 2018-12-10 DIAGNOSIS — I25118 Atherosclerotic heart disease of native coronary artery with other forms of angina pectoris: Secondary | ICD-10-CM

## 2018-12-10 DIAGNOSIS — I5189 Other ill-defined heart diseases: Secondary | ICD-10-CM

## 2018-12-10 NOTE — Progress Notes (Signed)
Virtual Visit via Telephone Note   This visit type was conducted due to national recommendations for restrictions regarding the COVID-19 Pandemic (e.g. social distancing) in an effort to limit this patient's exposure and mitigate transmission in our community.  Due to her co-morbid illnesses, this patient is at least at moderate risk for complications without adequate follow up.  This format is felt to be most appropriate for this patient at this time.  The patient did not have access to video technology/had technical difficulties with video requiring transitioning to audio format only (telephone).  All issues noted in this document were discussed and addressed.  No physical exam could be performed with this format.  Please refer to the patient's chart for her  consent to telehealth for [facility]   Date:  12/10/2018   ID:  Tracy Mills, DOB 03-20-1946, MRN [id number]  Patient Location: Home Provider Location: Office  PCP:  Ignatius Specking, MD  Cardiologist:  Prentice Docker, MD  Electrophysiologist:  Hillis Range, MD   Evaluation Performed:  Follow-Up Visit  Chief Complaint: CHF  History of Present Illness:    Tracy Mills is a 73 y.o. female with a history of CAD and ischemic cardiomyopathy with chronic combined heart failure. She underwent CRT-D upgrade on 11/16/13 by Dr. Johney Frame.  Thoracic impedance was abnormal on 11/16/2018 suggesting possible fluid accumulation since 10/28/2018, but it was trending back towards her normal baseline.  Next check is later this month.  She denies chest pain, palpitations, leg swelling, orthopnea, and paroxysmal nocturnal dyspnea.  There was a death in the family about a month ago and she has not been eating as much and has lost a little weight.  The patient does not have symptoms concerning for COVID-19 infection (fever, chills, cough, or new shortness of breath).    Past Medical History:  Diagnosis Date  . Chronic systolic congestive  heart failure (HCC)   . Coronary artery disease    anterior MI in 2010. LAD stent placement 2010. EF: 15%  . Hypertension   . Ischemic cardiomyopathy   . LBBB (left bundle branch block)   . MR (mitral regurgitation)    moderate to severe  . Tobacco abuse   . VF (ventricular fibrillation) (HCC) 2013   nonsustained VF, spontaneously termianted   Past Surgical History:  Procedure Laterality Date  . BI-VENTRICULAR IMPLANTABLE CARDIOVERTER DEFIBRILLATOR UPGRADE  11-16-2013   upgrade of previously implanted dual chamber ICD to MDT Ovidio Kin XT CRTD by Dr Johney Frame  . BI-VENTRICULAR IMPLANTABLE CARDIOVERTER DEFIBRILLATOR UPGRADE N/A 11/16/2013   Procedure: BI-VENTRICULAR IMPLANTABLE CARDIOVERTER DEFIBRILLATOR UPGRADE;  Surgeon: Gardiner Rhyme, MD;  Location: [facility] CATH LAB;  Service: Cardiovascular;  Laterality: N/A;  . CARDIAC CATHETERIZATION  2010   LAD PCI and stent placement  . CARDIAC DEFIBRILLATOR PLACEMENT  06/14/09   MDT [place] Dr Johney Frame  . TONSILLECTOMY AND ADENOIDECTOMY    . TUBAL LIGATION       Current Meds  Medication Sig  . acetaminophen (TYLENOL) 500 MG tablet Take 500 mg by mouth as needed.   Marland Kitchen aspirin 81 MG tablet Take 81 mg by mouth daily.    . carvedilol (COREG) 3.125 MG tablet TAKE 1 TABLET BY MOUTH 2 (TWO) TIMES DAILY.  . furosemide (LASIX) 40 MG tablet TAKE 1 TABLET (40 MG) BY MOUTH DAILY.  Marland Kitchen levocetirizine (XYZAL) 5 MG tablet Take 5 mg by mouth every evening.  . nitroGLYCERIN (NITROSTAT) 0.4 MG SL tablet Place 1 tablet (0.4 mg total)  under the tongue every 5 (five) minutes as needed for chest pain.  . rosuvastatin (CRESTOR) 20 MG tablet TAKE 1 TABLET EVERY DAY  . sacubitril-valsartan (ENTRESTO) 24-26 MG Take 1 tablet by mouth 2 (two) times daily.  Marland Kitchen. spironolactone (ALDACTONE) 25 MG tablet TAKE 1 TABLET (25 MG) BY MOUTH DAILY.     Allergies:   Penicillins, Sulfa antibiotics, and Tetracyclines & related   Social History   Tobacco Use  . Smoking status: Current Some Day  Smoker    Packs/day: 0.20    Years: 45.00    Pack years: 9.00    Types: Cigarettes    Start date: 04/07/1966    Last attempt to quit: 06/04/2016    Years since quitting: 2.5  . Smokeless tobacco: Never Used  . Tobacco comment: smokes socially/ smokes 2 cigarettes per month, trying to quit- quit 6 months ago 03/07/15  Substance Use Topics  . Alcohol use: Yes    Alcohol/week: 0.0 standard drinks    Comment: occasional alcohol use  . Drug use: No     Family Hx: The patient's family history includes Diabetes type II in her mother; Heart attack (age of onset: 3051) in her brother; Other in her brother and father; Seizures in her sister.  ROS:   Please see the history of present illness.     All other systems reviewed and are negative.   Prior CV studies:   The following studies were reviewed today:  Echocardiogram 07/09/17 showed severely reduced left ventricular systolic function, LVEF 25-30%.  There were multiple wall motion abnormalities.  There is no definitive mural thrombus.  There was grade 2 diastolic dysfunction and moderate mitral regurgitation.  There is a small pericardial effusion anteriorly.  Labs/Other Tests and Data Reviewed:    EKG:  No ECG reviewed.  Recent Labs: No results found for requested labs within last 8760 hours.   Recent Lipid Panel No results found for: CHOL, TRIG, HDL, CHOLHDL, LDLCALC, LDLDIRECT  Wt Readings from Last 3 Encounters:  12/10/18 124 lb (56.2 kg)  03/27/18 131 lb 12.8 oz (59.8 kg)  08/12/17 139 lb (63 kg)     Objective:    Vital Signs:  Ht 5\' 3"  (1.6 m)   Wt 124 lb (56.2 kg)   BMI 21.97 kg/m    VITAL SIGNS:  reviewed  ASSESSMENT & PLAN:    1. Coronary artery disease with prior LAD PCI: Symptomatically stable. Continue aspirin, carvedilol, and statin therapy.    2. Chronic combined systolic and diastolic heart failure: Euvolemic. Continue Lasix 40 mg daily and spironolactone 25 mg daily along with carvedilol and Entresto.  Left ventricular systolic function is severely reduced as detailed above, LVEF 25-30%.   3. Ventricular tachycardias/p ICD: Stable and without shocks. Continue Coreg.  4. Hypertension: She couldn't find her BP cuff. No changes.  5. Hyperlipidemia: Continue Crestor. Simvastatin led to belching.  Lipid panel from 11/24/2018 reviewed.  LDL 51.  6. CKD stage III: Creatinine 1.26 on 11/24/2018.    COVID-19 Education: The signs and symptoms of COVID-19 were discussed with the patient and how to seek care for testing (follow up with PCP or arrange E-visit).  The importance of social distancing was discussed today.  Time:   Today, I have spent 10 minutes with the patient with telehealth technology discussing the above problems.     Medication Adjustments/Labs and Tests Ordered: Current medicines are reviewed at length with the patient today.  Concerns regarding medicines are outlined above.  Tests Ordered: No orders of the defined types were placed in this encounter.   Medication Changes: No orders of the defined types were placed in this encounter.   Follow Up:  Virtual Visit or In Person with Dr. [name] in November 2020.  Follow-up with me in May 2021.  Signed, [name], MD  12/10/2018 10:09 AM    [place]

## 2018-12-10 NOTE — Patient Instructions (Signed)
Medication Instructions:  Continue all current medications.  Labwork: none  Testing/Procedures: none  Follow-Up: May 2021 - since you will be seeing Dr. [name] this November.    Any Other Special Instructions Will Be Listed Below (If Applicable).  If you need a refill on your cardiac medications before your next appointment, please call your pharmacy.

## 2018-12-21 ENCOUNTER — Ambulatory Visit (INDEPENDENT_AMBULATORY_CARE_PROVIDER_SITE_OTHER): Payer: Medicare PPO

## 2018-12-21 DIAGNOSIS — Z95810 Presence of automatic (implantable) cardiac defibrillator: Secondary | ICD-10-CM

## 2018-12-21 DIAGNOSIS — I5042 Chronic combined systolic (congestive) and diastolic (congestive) heart failure: Secondary | ICD-10-CM | POA: Diagnosis not present

## 2018-12-23 ENCOUNTER — Telehealth: Payer: Self-pay

## 2018-12-23 NOTE — Progress Notes (Signed)
EPIC Encounter for ICM Monitoring  Patient Name: Tracy Mills is a 73 y.o. female Date: 12/23/2018 Primary Care Physican: [name], MD Primary [place] Electrophysiologist: Allred Bi-V Pacing: 95.3% Last Weight:132lbs Not weighing at home.  Attempted call to patient and unable to reach.   Transmission reviewed.   OptivolThoracic impedancenormal.  TakesFurosemide 40 mg take 1 tablet daily  Labs: 11/24/2018 Creatinine 1.26, BUN 14, Potassium 4.6, Sodium 135, GFR 43-49 09/11/2018 Creatinine 1.19, BUN 16, Potassium 4.6, Sodium 135, GFR 45-54  Recommendations:  Unable to reach.    Follow-up plan: ICM clinic phone appointment on8/31/2020.  Copy of ICM check sent to Dr.Allred.  3 month ICM trend: 12/21/2018    1 Year ICM trend:       Rosalene Billings, RN 12/23/2018 10:16 AM

## 2018-12-23 NOTE — Telephone Encounter (Signed)
Remote ICM transmission received.  Attempted call to patient regarding ICM remote transmission and no answer.  

## 2019-01-14 ENCOUNTER — Ambulatory Visit (INDEPENDENT_AMBULATORY_CARE_PROVIDER_SITE_OTHER): Payer: Medicare PPO | Admitting: *Deleted

## 2019-01-14 DIAGNOSIS — I5022 Chronic systolic (congestive) heart failure: Secondary | ICD-10-CM | POA: Diagnosis not present

## 2019-01-14 DIAGNOSIS — I255 Ischemic cardiomyopathy: Secondary | ICD-10-CM

## 2019-01-17 LAB — CUP PACEART REMOTE DEVICE CHECK
Battery Remaining Longevity: 14 mo
Battery Voltage: 2.86 V
Brady Statistic AP VP Percent: 61.05 %
Brady Statistic AP VS Percent: 0.04 %
Brady Statistic AS VP Percent: 38.78 %
Brady Statistic AS VS Percent: 0.12 %
Brady Statistic RA Percent Paced: 60.73 %
Brady Statistic RV Percent Paced: 97.42 %
Date Time Interrogation Session: 20200822133530
HighPow Impedance: 42 Ohm
HighPow Impedance: 51 Ohm
Implantable Lead Implant Date: 20110119
Implantable Lead Implant Date: 20110119
Implantable Lead Implant Date: 20150623
Implantable Lead Location: 753858
Implantable Lead Location: 753859
Implantable Lead Location: 753860
Implantable Lead Model: 4598
Implantable Lead Model: 5076
Implantable Lead Model: 6947
Implantable Pulse Generator Implant Date: 20150623
Lead Channel Impedance Value: 1064 Ohm
Lead Channel Impedance Value: 1121 Ohm
Lead Channel Impedance Value: 342 Ohm
Lead Channel Impedance Value: 361 Ohm
Lead Channel Impedance Value: 418 Ohm
Lead Channel Impedance Value: 456 Ohm
Lead Channel Impedance Value: 513 Ohm
Lead Channel Impedance Value: 532 Ohm
Lead Channel Impedance Value: 665 Ohm
Lead Channel Impedance Value: 703 Ohm
Lead Channel Impedance Value: 722 Ohm
Lead Channel Impedance Value: 760 Ohm
Lead Channel Impedance Value: 988 Ohm
Lead Channel Pacing Threshold Amplitude: 0.5 V
Lead Channel Pacing Threshold Amplitude: 1 V
Lead Channel Pacing Threshold Amplitude: 2 V
Lead Channel Pacing Threshold Pulse Width: 0.4 ms
Lead Channel Pacing Threshold Pulse Width: 0.4 ms
Lead Channel Pacing Threshold Pulse Width: 0.8 ms
Lead Channel Sensing Intrinsic Amplitude: 1.875 mV
Lead Channel Sensing Intrinsic Amplitude: 1.875 mV
Lead Channel Sensing Intrinsic Amplitude: 21.5 mV
Lead Channel Sensing Intrinsic Amplitude: 21.5 mV
Lead Channel Setting Pacing Amplitude: 2 V
Lead Channel Setting Pacing Amplitude: 2.5 V
Lead Channel Setting Pacing Amplitude: 3 V
Lead Channel Setting Pacing Pulse Width: 0.4 ms
Lead Channel Setting Pacing Pulse Width: 0.8 ms
Lead Channel Setting Sensing Sensitivity: 0.3 mV

## 2019-01-22 NOTE — Progress Notes (Signed)
Remote ICD transmission.   

## 2019-01-25 ENCOUNTER — Ambulatory Visit (INDEPENDENT_AMBULATORY_CARE_PROVIDER_SITE_OTHER): Payer: Medicare PPO

## 2019-01-25 DIAGNOSIS — I5022 Chronic systolic (congestive) heart failure: Secondary | ICD-10-CM

## 2019-01-25 DIAGNOSIS — Z95810 Presence of automatic (implantable) cardiac defibrillator: Secondary | ICD-10-CM

## 2019-01-27 NOTE — Progress Notes (Signed)
EPIC Encounter for ICM Monitoring  Patient Name: Tracy Mills is a 73 y.o. female Date: 01/27/2019 Primary Care Physican: [name], MD Primary [place] Electrophysiologist: Allred Bi-V Pacing: 86.6% Last Weight:132lbs   Spoke with patient.  She said she is doing fine and denies fluid symptoms.   OptivolThoracic impedancenormal.  TakesFurosemide 40 mg take 1 tablet daily  Labs: 11/24/2018 Creatinine 1.26, BUN 14, Potassium 4.6, Sodium 135, GFR 43-49 09/11/2018 Creatinine 1.19, BUN 16, Potassium 4.6, Sodium 135, GFR 45-54  Recommendations: *No changes and encouraged to call if experiencing any fluid symptoms.  Follow-up plan: ICM clinic phone appointment on 03/15/2019.   91 day device clinic remote transmission 04/15/2019.  Office appt 04/02/2019 with Dr. [name].    Copy of ICM check sent to Dr. [name].   3 month ICM trend: 01/25/2019    1 Year ICM trend:       Rosalene Billings, RN 01/27/2019 11:17 AM

## 2019-02-19 DIAGNOSIS — E2839 Other primary ovarian failure: Secondary | ICD-10-CM | POA: Diagnosis not present

## 2019-03-15 ENCOUNTER — Ambulatory Visit (INDEPENDENT_AMBULATORY_CARE_PROVIDER_SITE_OTHER): Payer: Medicare PPO

## 2019-03-15 DIAGNOSIS — I5022 Chronic systolic (congestive) heart failure: Secondary | ICD-10-CM | POA: Diagnosis not present

## 2019-03-15 DIAGNOSIS — Z95810 Presence of automatic (implantable) cardiac defibrillator: Secondary | ICD-10-CM | POA: Diagnosis not present

## 2019-03-19 NOTE — Progress Notes (Signed)
EPIC Encounter for ICM Monitoring  Patient Name: Tracy Mills is a 73 y.o. female Date: 03/19/2019 Primary Care Physican: [name], MD Primary [place] Electrophysiologist: Allred Bi-V Pacing: 96.4% LastWeight:132lbs      Transmission reviewed.   OptivolThoracic impedancenormal.  TakesFurosemide 40 mgtake1 tablet daily  Labs: 11/24/2018 Creatinine 1.26, BUN 14, Potassium 4.6, Sodium 135, GFR 43-49 09/11/2018 Creatinine 1.19, BUN 16, Potassium 4.6, Sodium 135, GFR 45-54  Recommendations: None  Follow-up plan: ICM clinic phone appointment on 05/10/2019.   91 day device clinic remote transmission 04/15/2019.  Office appt 04/02/2019 with Dr. [name].    Copy of ICM check sent to Dr. [name].   3 month ICM trend: 03/15/2019    1 Year ICM trend:       Tracy Billings, RN 03/19/2019 11:23 AM

## 2019-03-29 DIAGNOSIS — Z1231 Encounter for screening mammogram for malignant neoplasm of breast: Secondary | ICD-10-CM | POA: Diagnosis not present

## 2019-04-02 ENCOUNTER — Telehealth (INDEPENDENT_AMBULATORY_CARE_PROVIDER_SITE_OTHER): Payer: Medicare PPO | Admitting: Internal Medicine

## 2019-04-02 ENCOUNTER — Encounter: Payer: Self-pay | Admitting: Internal Medicine

## 2019-04-02 VITALS — Ht 63.0 in

## 2019-04-02 DIAGNOSIS — I10 Essential (primary) hypertension: Secondary | ICD-10-CM

## 2019-04-02 DIAGNOSIS — I472 Ventricular tachycardia, unspecified: Secondary | ICD-10-CM

## 2019-04-02 DIAGNOSIS — I5022 Chronic systolic (congestive) heart failure: Secondary | ICD-10-CM | POA: Diagnosis not present

## 2019-04-02 MED ORDER — NITROGLYCERIN 0.4 MG SL SUBL: 0.4000 mg | SUBLINGUAL_TABLET | SUBLINGUAL | 3 refills | Status: DC | PRN

## 2019-04-02 NOTE — Progress Notes (Signed)
Electrophysiology TeleHealth Note   Due to national recommendations of social distancing due to [place] 19, an audio telehealth visit is felt to be most appropriate for this patient at this time.  Verbal consent was obtained by me for the telehealth visit today.  The patient does not have capability for a virtual visit.  A phone visit is therefore required today.   Date:  04/02/2019   ID:  Tracy Mills, DOB [birth year], MRN [id number]  Location: patient's home  Provider location:  [facility]  Evaluation Performed: Follow-up visit  PCP:  [name], MD   Electrophysiologist:  Dr [name]  Chief Complaint:  Follow up  History of Present Illness:    Tracy Mills is a 73 y.o. female who presents via telehealth conferencing today.  Since last being seen in our clinic, the patient reports doing very well.  Today, she denies symptoms of palpitations, chest pain, shortness of breath,  lower extremity edema, dizziness, presyncope, or syncope.  The patient is otherwise without complaint today.  The patient denies symptoms of fevers, chills, cough, or new SOB worrisome for COVID 19.  Past Medical History:  Diagnosis Date  . Chronic systolic congestive heart failure ([place])   . Coronary artery disease    anterior MI in 2010. LAD stent placement 2010. EF: 15%  . Hypertension   . Ischemic cardiomyopathy   . LBBB (left bundle branch block)   . MR (mitral regurgitation)    moderate to severe  . Tobacco abuse   . VF (ventricular fibrillation) ([place]) 2013   nonsustained VF, spontaneously termianted    Past Surgical History:  Procedure Laterality Date  . BI-VENTRICULAR IMPLANTABLE CARDIOVERTER DEFIBRILLATOR UPGRADE  11-16-2013   upgrade of previously implanted dual chamber ICD to MDT Hillery Aldo XT CRTD by Dr [name]  . BI-VENTRICULAR IMPLANTABLE CARDIOVERTER DEFIBRILLATOR UPGRADE N/A 11/16/2013   Procedure: BI-VENTRICULAR IMPLANTABLE CARDIOVERTER DEFIBRILLATOR UPGRADE;  Surgeon: Coralyn Mark, MD;  Location: [facility] CATH LAB;  Service: Cardiovascular;  Laterality: N/A;  . CARDIAC CATHETERIZATION  2010   LAD PCI and stent placement  . CARDIAC DEFIBRILLATOR PLACEMENT  06/14/09   MDT [facility] Dr [name]  . TONSILLECTOMY AND ADENOIDECTOMY    . TUBAL LIGATION      Current Outpatient Medications  Medication Sig Dispense Refill  . acetaminophen (TYLENOL) 500 MG tablet Take 500 mg by mouth as needed.     Marland Kitchen aspirin 81 MG tablet Take 81 mg by mouth daily.      . carvedilol (COREG) 3.125 MG tablet TAKE 1 TABLET BY MOUTH 2 (TWO) TIMES DAILY. 180 tablet 3  . cetirizine (ZYRTEC) 10 MG tablet Take 10 mg by mouth daily.    . furosemide (LASIX) 40 MG tablet TAKE 1 TABLET (40 MG) BY MOUTH DAILY. 90 tablet 3  . nitroGLYCERIN (NITROSTAT) 0.4 MG SL tablet Place 1 tablet (0.4 mg total) under the tongue every 5 (five) minutes as needed for chest pain. 25 tablet 3  . rosuvastatin (CRESTOR) 20 MG tablet TAKE 1 TABLET EVERY DAY 90 tablet 2  . sacubitril-valsartan (ENTRESTO) 24-26 MG Take 1 tablet by mouth 2 (two) times daily. 180 tablet 3  . spironolactone (ALDACTONE) 25 MG tablet TAKE 1 TABLET (25 MG) BY MOUTH DAILY. 90 tablet 3   No current facility-administered medications for this visit.     Allergies:   Penicillins, Sulfa antibiotics, and Tetracyclines & related   Social History:  The patient  reports that she quit smoking about  2 years ago. Her smoking use included cigarettes. She started smoking about 53 years ago. She has a 9.00 pack-year smoking history. She has never used smokeless tobacco. She reports current alcohol use. She reports that she does not use drugs.   Family History:  The patient's  family history includes Diabetes type II in her mother; Heart attack (age of onset: 73) in her brother; Other in her brother and father; Seizures in her sister.   ROS:  Please see the history of present illness.   All other systems are personally reviewed and negative.    Exam:    Vital Signs:   Ht 5\' 3"  (1.6 m)   BMI 21.97 kg/m   Well sounding and appearing, alert and conversant, regular work of breathing   Labs/Other Tests and Data Reviewed:    Recent Labs: No results found for requested labs within last 8760 hours.   Wt Readings from Last 3 Encounters:  12/10/18 124 lb (56.2 kg)  03/27/18 131 lb 12.8 oz (59.8 kg)  08/12/17 139 lb (63 kg)     Last device remote is reviewed from PaceART PDF which reveals normal device function, no arrhythmias    ASSESSMENT & PLAN:    1.  Chronic systolic heart failure/CAD/ICM Stable by symptoms Normal recent remote transmission See PaceArt report Continue follow up in ICM clinic She is transmitting wirelessly - estimated longevity 11 months   2.  VT No recent arrhythmias  3.  HTN Stable No change required today    Follow-up:  Carelink, me in 1 year    Patient Risk:  after full review of this patients clinical status, I feel that they are at moderate risk at this time.  Today, I have spent 15 minutes with the patient with telehealth technology discussing arrhythmia management .    08/14/17, MD  04/02/2019 10:10 AM     [facility] [address] [place] [phone] (office) [phone] (fax)

## 2019-04-02 NOTE — Patient Instructions (Signed)
Medication Instructions:  Continue all current medications.  Labwork: none  Testing/Procedures: none  Follow-Up: 1 year   Any Other Special Instructions Will Be Listed Below (If Applicable). Next remote as planned.   If you need a refill on your cardiac medications before your next appointment, please call your pharmacy.  

## 2019-04-14 DIAGNOSIS — I10 Essential (primary) hypertension: Secondary | ICD-10-CM | POA: Diagnosis not present

## 2019-04-14 DIAGNOSIS — Z6821 Body mass index (BMI) 21.0-21.9, adult: Secondary | ICD-10-CM | POA: Diagnosis not present

## 2019-04-14 DIAGNOSIS — L309 Dermatitis, unspecified: Secondary | ICD-10-CM | POA: Diagnosis not present

## 2019-04-14 DIAGNOSIS — I509 Heart failure, unspecified: Secondary | ICD-10-CM | POA: Diagnosis not present

## 2019-04-14 DIAGNOSIS — Z299 Encounter for prophylactic measures, unspecified: Secondary | ICD-10-CM | POA: Diagnosis not present

## 2019-04-15 ENCOUNTER — Ambulatory Visit (INDEPENDENT_AMBULATORY_CARE_PROVIDER_SITE_OTHER): Payer: Medicare PPO | Admitting: *Deleted

## 2019-04-15 DIAGNOSIS — I5022 Chronic systolic (congestive) heart failure: Secondary | ICD-10-CM

## 2019-04-15 DIAGNOSIS — I255 Ischemic cardiomyopathy: Secondary | ICD-10-CM

## 2019-04-15 LAB — CUP PACEART REMOTE DEVICE CHECK
Battery Remaining Longevity: 11 mo
Battery Voltage: 2.86 V
Brady Statistic AP VP Percent: 50.43 %
Brady Statistic AP VS Percent: 0.04 %
Brady Statistic AS VP Percent: 49.28 %
Brady Statistic AS VS Percent: 0.25 %
Brady Statistic RA Percent Paced: 49.94 %
Brady Statistic RV Percent Paced: 95.84 %
Date Time Interrogation Session: 20201119120333
HighPow Impedance: 42 Ohm
HighPow Impedance: 52 Ohm
Implantable Lead Implant Date: 20110119
Implantable Lead Implant Date: 20110119
Implantable Lead Implant Date: 20150623
Implantable Lead Location: 753858
Implantable Lead Location: 753859
Implantable Lead Location: 753860
Implantable Lead Model: 4598
Implantable Lead Model: 5076
Implantable Lead Model: 6947
Implantable Pulse Generator Implant Date: 20150623
Lead Channel Impedance Value: 304 Ohm
Lead Channel Impedance Value: 399 Ohm
Lead Channel Impedance Value: 418 Ohm
Lead Channel Impedance Value: 418 Ohm
Lead Channel Impedance Value: 456 Ohm
Lead Channel Impedance Value: 475 Ohm
Lead Channel Impedance Value: 551 Ohm
Lead Channel Impedance Value: 589 Ohm
Lead Channel Impedance Value: 589 Ohm
Lead Channel Impedance Value: 646 Ohm
Lead Channel Impedance Value: 817 Ohm
Lead Channel Impedance Value: 893 Ohm
Lead Channel Impedance Value: 931 Ohm
Lead Channel Pacing Threshold Amplitude: 0.5 V
Lead Channel Pacing Threshold Amplitude: 0.875 V
Lead Channel Pacing Threshold Amplitude: 2 V
Lead Channel Pacing Threshold Pulse Width: 0.4 ms
Lead Channel Pacing Threshold Pulse Width: 0.4 ms
Lead Channel Pacing Threshold Pulse Width: 0.8 ms
Lead Channel Sensing Intrinsic Amplitude: 1.25 mV
Lead Channel Sensing Intrinsic Amplitude: 1.25 mV
Lead Channel Sensing Intrinsic Amplitude: 17.625 mV
Lead Channel Sensing Intrinsic Amplitude: 17.625 mV
Lead Channel Setting Pacing Amplitude: 2 V
Lead Channel Setting Pacing Amplitude: 2.5 V
Lead Channel Setting Pacing Amplitude: 3 V
Lead Channel Setting Pacing Pulse Width: 0.4 ms
Lead Channel Setting Pacing Pulse Width: 0.8 ms
Lead Channel Setting Sensing Sensitivity: 0.3 mV

## 2019-04-16 ENCOUNTER — Other Ambulatory Visit: Payer: Self-pay | Admitting: *Deleted

## 2019-04-16 MED ORDER — SACUBITRIL-VALSARTAN 24-26 MG PO TABS: 1.0000 | ORAL_TABLET | Freq: Two times a day (BID) | ORAL | 3 refills | Status: DC

## 2019-05-10 ENCOUNTER — Ambulatory Visit (INDEPENDENT_AMBULATORY_CARE_PROVIDER_SITE_OTHER): Payer: Medicare PPO

## 2019-05-10 DIAGNOSIS — I5022 Chronic systolic (congestive) heart failure: Secondary | ICD-10-CM

## 2019-05-10 DIAGNOSIS — Z95810 Presence of automatic (implantable) cardiac defibrillator: Secondary | ICD-10-CM | POA: Diagnosis not present

## 2019-05-12 ENCOUNTER — Telehealth: Payer: Self-pay

## 2019-05-12 NOTE — Progress Notes (Signed)
EPIC Encounter for ICM Monitoring  Patient Name: Tracy Mills is a 73 y.o. female Date: 05/12/2019 Primary Care Physican: [name], MD Primary [place] Electrophysiologist: Allred Bi-V Pacing: 95.6% LastWeight:132lbs     Attempted call to patient and unable to reach.  Left detailed message per DPR regarding transmission. Transmission reviewed.   OptivolThoracic impedancenormal.  TakesFurosemide 40 mgtake1 tablet daily  Labs: 11/24/2018 Creatinine 1.26, BUN 14, Potassium 4.6, Sodium 135, GFR 43-49 09/11/2018 Creatinine 1.19, BUN 16, Potassium 4.6, Sodium 135, GFR 45-54  Recommendations: Left voice mail with ICM number and encouraged to call if experiencing any fluid symptoms.  Follow-up plan: ICM clinic phone appointment on 06/14/2019.   91 day device clinic remote transmission 07/15/2019.    Copy of ICM check sent to Dr. [name].   3 month ICM trend: 05/10/2019    1 Year ICM trend:       Rosalene Billings, RN 05/12/2019 10:44 AM

## 2019-05-12 NOTE — Telephone Encounter (Signed)
Remote ICM transmission received.  Attempted call to patient regarding ICM remote transmission and left detailed message per DPR.  Advised to return call for any fluid symptoms or questions. Next ICM remote transmission scheduled 04/03/2020.     

## 2019-05-13 DIAGNOSIS — F17210 Nicotine dependence, cigarettes, uncomplicated: Secondary | ICD-10-CM | POA: Diagnosis not present

## 2019-05-13 DIAGNOSIS — I472 Ventricular tachycardia: Secondary | ICD-10-CM | POA: Diagnosis not present

## 2019-05-13 DIAGNOSIS — Z6821 Body mass index (BMI) 21.0-21.9, adult: Secondary | ICD-10-CM | POA: Diagnosis not present

## 2019-05-13 DIAGNOSIS — I25700 Atherosclerosis of coronary artery bypass graft(s), unspecified, with unstable angina pectoris: Secondary | ICD-10-CM | POA: Diagnosis not present

## 2019-05-13 DIAGNOSIS — I509 Heart failure, unspecified: Secondary | ICD-10-CM | POA: Diagnosis not present

## 2019-05-13 DIAGNOSIS — I10 Essential (primary) hypertension: Secondary | ICD-10-CM | POA: Diagnosis not present

## 2019-05-13 DIAGNOSIS — Z299 Encounter for prophylactic measures, unspecified: Secondary | ICD-10-CM | POA: Diagnosis not present

## 2019-05-14 NOTE — Progress Notes (Signed)
Remote ICD transmission.   

## 2019-06-14 ENCOUNTER — Ambulatory Visit (INDEPENDENT_AMBULATORY_CARE_PROVIDER_SITE_OTHER): Payer: Medicare Other

## 2019-06-14 DIAGNOSIS — I5022 Chronic systolic (congestive) heart failure: Secondary | ICD-10-CM | POA: Diagnosis not present

## 2019-06-14 DIAGNOSIS — Z95810 Presence of automatic (implantable) cardiac defibrillator: Secondary | ICD-10-CM | POA: Diagnosis not present

## 2019-06-16 ENCOUNTER — Telehealth: Payer: Self-pay

## 2019-06-16 NOTE — Telephone Encounter (Signed)
Remote ICM transmission received.  Attempted call to patient regarding ICM remote transmission and no answer.  

## 2019-06-16 NOTE — Progress Notes (Signed)
EPIC Encounter for ICM Monitoring  Patient Name: Tracy Mills is a 74 y.o. female Date: 06/16/2019 Primary Care Physican: Ignatius Specking, MD Primary Cardiologist:Koneswaran Electrophysiologist: Allred Bi-V Pacing: 96.2% LastWeight:132lbs    Attempted call to patient and unable to reach.  Transmission reviewed.   OptivolThoracic impedancenormal.  TakesFurosemide 40 mgtake1 tablet daily  Labs: 11/24/2018 Creatinine 1.26, BUN 14, Potassium 4.6, Sodium 135, GFR 43-49 09/11/2018 Creatinine 1.19, BUN 16, Potassium 4.6, Sodium 135, GFR 45-54  Recommendations:Unable to reach.    Follow-up plan: ICM clinic phone appointment on2/19/2021. 91 day device clinic remote transmission 07/15/2019.   Copy of ICM check sent to Dr.Allred.   3 month ICM trend: 06/14/2019    1 Year ICM trend:       [name], RN 06/16/2019 9:53 AM

## 2019-07-14 ENCOUNTER — Other Ambulatory Visit: Payer: Self-pay | Admitting: *Deleted

## 2019-07-14 DIAGNOSIS — I5022 Chronic systolic (congestive) heart failure: Secondary | ICD-10-CM

## 2019-07-14 DIAGNOSIS — E785 Hyperlipidemia, unspecified: Secondary | ICD-10-CM

## 2019-07-14 MED ORDER — FUROSEMIDE 40 MG PO TABS: ORAL_TABLET | ORAL | 3 refills | Status: DC

## 2019-07-14 MED ORDER — SPIRONOLACTONE 25 MG PO TABS: ORAL_TABLET | ORAL | 1 refills | Status: DC

## 2019-07-14 MED ORDER — ROSUVASTATIN CALCIUM 20 MG PO TABS: 20.0000 mg | ORAL_TABLET | Freq: Every day | ORAL | 1 refills | Status: DC

## 2019-07-14 MED ORDER — SACUBITRIL-VALSARTAN 24-26 MG PO TABS: 1.0000 | ORAL_TABLET | Freq: Two times a day (BID) | ORAL | 3 refills | Status: DC

## 2019-07-14 MED ORDER — CARVEDILOL 3.125 MG PO TABS: ORAL_TABLET | ORAL | 3 refills | Status: DC

## 2019-07-15 ENCOUNTER — Telehealth: Payer: Self-pay | Admitting: Student

## 2019-07-15 ENCOUNTER — Ambulatory Visit (INDEPENDENT_AMBULATORY_CARE_PROVIDER_SITE_OTHER): Payer: Medicare Other | Admitting: *Deleted

## 2019-07-15 DIAGNOSIS — I5022 Chronic systolic (congestive) heart failure: Secondary | ICD-10-CM | POA: Diagnosis not present

## 2019-07-15 LAB — CUP PACEART REMOTE DEVICE CHECK
Battery Remaining Longevity: 8 mo
Battery Voltage: 2.83 V
Brady Statistic AP VP Percent: 77.32 %
Brady Statistic AP VS Percent: 0 %
Brady Statistic AS VP Percent: 22.67 %
Brady Statistic AS VS Percent: 0.01 %
Brady Statistic RA Percent Paced: 77.28 %
Brady Statistic RV Percent Paced: 99.89 %
Date Time Interrogation Session: 20210218131218
HighPow Impedance: 43 Ohm
HighPow Impedance: 53 Ohm
Implantable Lead Implant Date: 20110119
Implantable Lead Implant Date: 20110119
Implantable Lead Implant Date: 20150623
Implantable Lead Location: 753858
Implantable Lead Location: 753859
Implantable Lead Location: 753860
Implantable Lead Model: 4598
Implantable Lead Model: 5076
Implantable Lead Model: 6947
Implantable Pulse Generator Implant Date: 20150623
Lead Channel Impedance Value: 1007 Ohm
Lead Channel Impedance Value: 1026 Ohm
Lead Channel Impedance Value: 342 Ohm
Lead Channel Impedance Value: 399 Ohm
Lead Channel Impedance Value: 418 Ohm
Lead Channel Impedance Value: 456 Ohm
Lead Channel Impedance Value: 475 Ohm
Lead Channel Impedance Value: 475 Ohm
Lead Channel Impedance Value: 589 Ohm
Lead Channel Impedance Value: 703 Ohm
Lead Channel Impedance Value: 703 Ohm
Lead Channel Impedance Value: 703 Ohm
Lead Channel Impedance Value: 931 Ohm
Lead Channel Pacing Threshold Amplitude: 0.5 V
Lead Channel Pacing Threshold Amplitude: 0.875 V
Lead Channel Pacing Threshold Amplitude: 2.125 V
Lead Channel Pacing Threshold Pulse Width: 0.4 ms
Lead Channel Pacing Threshold Pulse Width: 0.4 ms
Lead Channel Pacing Threshold Pulse Width: 0.8 ms
Lead Channel Sensing Intrinsic Amplitude: 1.75 mV
Lead Channel Sensing Intrinsic Amplitude: 1.75 mV
Lead Channel Sensing Intrinsic Amplitude: 20.75 mV
Lead Channel Sensing Intrinsic Amplitude: 20.75 mV
Lead Channel Setting Pacing Amplitude: 2 V
Lead Channel Setting Pacing Amplitude: 2.5 V
Lead Channel Setting Pacing Amplitude: 3.25 V
Lead Channel Setting Pacing Pulse Width: 0.4 ms
Lead Channel Setting Pacing Pulse Width: 0.8 ms
Lead Channel Setting Sensing Sensitivity: 0.3 mV

## 2019-07-15 NOTE — Progress Notes (Signed)
ICD Remote  

## 2019-07-15 NOTE — Telephone Encounter (Signed)
Normal carelink reviewed.  Pt with ERI in ~ 8 months.   Discussed with patient and updated to monthly battery checks.   She sends manual transmissions, so will send letter with her appointments listed.   Casimiro Needle [address] [place], PA-C  07/15/2019 1:10 PM

## 2019-07-16 ENCOUNTER — Ambulatory Visit (INDEPENDENT_AMBULATORY_CARE_PROVIDER_SITE_OTHER): Payer: Medicare Other

## 2019-07-16 DIAGNOSIS — Z95810 Presence of automatic (implantable) cardiac defibrillator: Secondary | ICD-10-CM

## 2019-07-16 DIAGNOSIS — I5022 Chronic systolic (congestive) heart failure: Secondary | ICD-10-CM

## 2019-07-20 NOTE — Progress Notes (Signed)
EPIC Encounter for ICM Monitoring  Patient Name: Tracy Mills is a 74 y.o. female Date: 07/20/2019 Primary Care Physican: Ignatius Specking, MD Primary Cardiologist:Koneswaran Electrophysiologist: Allred Bi-V Pacing: 99.9% LastWeight:132lbs  Battery estimated RRT: 8 months   Transmission reviewed.  OptivolThoracic impedancenormal.  TakesFurosemide 40 mgtake1 tablet daily  Labs: 11/24/2018 Creatinine 1.26, BUN 14, Potassium 4.6, Sodium 135, GFR 43-49 09/11/2018 Creatinine 1.19, BUN 16, Potassium 4.6, Sodium 135, GFR 45-54  Recommendations:None  Follow-up plan: ICM clinic phone appointment on3/29/2021. 91 day device clinic remote transmission3/18/2021.   Copy of ICM check sent to Dr.Allred.   3 month ICM trend: 07/15/2019    1 Year ICM trend:       [name], RN 07/20/2019 2:54 PM

## 2019-08-12 ENCOUNTER — Ambulatory Visit (INDEPENDENT_AMBULATORY_CARE_PROVIDER_SITE_OTHER): Payer: Medicare Other | Admitting: *Deleted

## 2019-08-12 DIAGNOSIS — I5022 Chronic systolic (congestive) heart failure: Secondary | ICD-10-CM

## 2019-08-12 LAB — CUP PACEART REMOTE DEVICE CHECK
Battery Remaining Longevity: 7 mo
Battery Voltage: 2.8 V
Brady Statistic AP VP Percent: 56.31 %
Brady Statistic AP VS Percent: 0.04 %
Brady Statistic AS VP Percent: 43.58 %
Brady Statistic AS VS Percent: 0.07 %
Brady Statistic RA Percent Paced: 56.3 %
Brady Statistic RV Percent Paced: 98.3 %
Date Time Interrogation Session: 20210318140547
HighPow Impedance: 43 Ohm
HighPow Impedance: 50 Ohm
Implantable Lead Implant Date: 20110119
Implantable Lead Implant Date: 20110119
Implantable Lead Implant Date: 20150623
Implantable Lead Location: 753858
Implantable Lead Location: 753859
Implantable Lead Location: 753860
Implantable Lead Model: 4598
Implantable Lead Model: 5076
Implantable Lead Model: 6947
Implantable Pulse Generator Implant Date: 20150623
Lead Channel Impedance Value: 1083 Ohm
Lead Channel Impedance Value: 1178 Ohm
Lead Channel Impedance Value: 342 Ohm
Lead Channel Impedance Value: 361 Ohm
Lead Channel Impedance Value: 418 Ohm
Lead Channel Impedance Value: 475 Ohm
Lead Channel Impedance Value: 532 Ohm
Lead Channel Impedance Value: 589 Ohm
Lead Channel Impedance Value: 722 Ohm
Lead Channel Impedance Value: 760 Ohm
Lead Channel Impedance Value: 760 Ohm
Lead Channel Impedance Value: 817 Ohm
Lead Channel Impedance Value: 988 Ohm
Lead Channel Pacing Threshold Amplitude: 0.5 V
Lead Channel Pacing Threshold Amplitude: 1.25 V
Lead Channel Pacing Threshold Amplitude: 2.25 V
Lead Channel Pacing Threshold Pulse Width: 0.4 ms
Lead Channel Pacing Threshold Pulse Width: 0.4 ms
Lead Channel Pacing Threshold Pulse Width: 0.8 ms
Lead Channel Sensing Intrinsic Amplitude: 2.125 mV
Lead Channel Sensing Intrinsic Amplitude: 2.125 mV
Lead Channel Sensing Intrinsic Amplitude: 24 mV
Lead Channel Sensing Intrinsic Amplitude: 24 mV
Lead Channel Setting Pacing Amplitude: 2 V
Lead Channel Setting Pacing Amplitude: 2.5 V
Lead Channel Setting Pacing Amplitude: 3.25 V
Lead Channel Setting Pacing Pulse Width: 0.4 ms
Lead Channel Setting Pacing Pulse Width: 0.8 ms
Lead Channel Setting Sensing Sensitivity: 0.3 mV

## 2019-09-03 NOTE — Progress Notes (Signed)
No ICM remote transmission received for 08/23/2019 and next ICM transmission scheduled for 09/13/2019.   

## 2019-09-13 ENCOUNTER — Ambulatory Visit (INDEPENDENT_AMBULATORY_CARE_PROVIDER_SITE_OTHER): Payer: Medicare Other

## 2019-09-13 ENCOUNTER — Ambulatory Visit (INDEPENDENT_AMBULATORY_CARE_PROVIDER_SITE_OTHER): Payer: Medicare Other | Admitting: *Deleted

## 2019-09-13 DIAGNOSIS — Z95810 Presence of automatic (implantable) cardiac defibrillator: Secondary | ICD-10-CM

## 2019-09-13 DIAGNOSIS — I5022 Chronic systolic (congestive) heart failure: Secondary | ICD-10-CM

## 2019-09-13 LAB — CUP PACEART REMOTE DEVICE CHECK
Battery Remaining Longevity: 6 mo
Battery Voltage: 2.81 V
Brady Statistic AP VP Percent: 63.32 %
Brady Statistic AP VS Percent: 0.04 %
Brady Statistic AS VP Percent: 36.58 %
Brady Statistic AS VS Percent: 0.06 %
Brady Statistic RA Percent Paced: 63.23 %
Brady Statistic RV Percent Paced: 97.54 %
Date Time Interrogation Session: 20210419125847
HighPow Impedance: 41 Ohm
HighPow Impedance: 52 Ohm
Implantable Lead Implant Date: 20110119
Implantable Lead Implant Date: 20110119
Implantable Lead Implant Date: 20150623
Implantable Lead Location: 753858
Implantable Lead Location: 753859
Implantable Lead Location: 753860
Implantable Lead Model: 4598
Implantable Lead Model: 5076
Implantable Lead Model: 6947
Implantable Pulse Generator Implant Date: 20150623
Lead Channel Impedance Value: 1026 Ohm
Lead Channel Impedance Value: 1083 Ohm
Lead Channel Impedance Value: 342 Ohm
Lead Channel Impedance Value: 342 Ohm
Lead Channel Impedance Value: 399 Ohm
Lead Channel Impedance Value: 456 Ohm
Lead Channel Impedance Value: 513 Ohm
Lead Channel Impedance Value: 551 Ohm
Lead Channel Impedance Value: 665 Ohm
Lead Channel Impedance Value: 722 Ohm
Lead Channel Impedance Value: 760 Ohm
Lead Channel Impedance Value: 817 Ohm
Lead Channel Impedance Value: 893 Ohm
Lead Channel Pacing Threshold Amplitude: 0.5 V
Lead Channel Pacing Threshold Amplitude: 1.125 V
Lead Channel Pacing Threshold Amplitude: 2.375 V
Lead Channel Pacing Threshold Pulse Width: 0.4 ms
Lead Channel Pacing Threshold Pulse Width: 0.4 ms
Lead Channel Pacing Threshold Pulse Width: 0.8 ms
Lead Channel Sensing Intrinsic Amplitude: 1.625 mV
Lead Channel Sensing Intrinsic Amplitude: 1.625 mV
Lead Channel Sensing Intrinsic Amplitude: 22.125 mV
Lead Channel Sensing Intrinsic Amplitude: 22.125 mV
Lead Channel Setting Pacing Amplitude: 2 V
Lead Channel Setting Pacing Amplitude: 2.5 V
Lead Channel Setting Pacing Amplitude: 3.5 V
Lead Channel Setting Pacing Pulse Width: 0.4 ms
Lead Channel Setting Pacing Pulse Width: 0.8 ms
Lead Channel Setting Sensing Sensitivity: 0.3 mV

## 2019-09-15 ENCOUNTER — Telehealth: Payer: Self-pay

## 2019-09-15 NOTE — Progress Notes (Signed)
EPIC Encounter for ICM Monitoring  Patient Name: Tracy Mills is a 74 y.o. female Date: 09/15/2019 Primary Care Physican: Ignatius Specking, MD Primary Cardiologist:Koneswaran Electrophysiologist: Allred Bi-V Pacing: 97.5% LastWeight:132lbs  AT/AF  2 Time in AT/AF  <0.1 hr/day (<0.1%) Longest AT/AF  9 minutes  Battery estimated RRT: 5 months   Attempted call to patient and unable to reach.  Left message to return call. Transmission reviewed.   OptivolThoracic impedancestarting to trend below baseline 09/13/2019.  TakesFurosemide 40 mgtake1 tablet daily  Labs: 11/24/2018 Creatinine 1.26, BUN 14, Potassium 4.6, Sodium 135, GFR 43-49 09/11/2018 Creatinine 1.19, BUN 16, Potassium 4.6, Sodium 135, GFR 45-54  Recommendations:Unable to reach.    Follow-up plan: ICM clinic phone appointment on5/10/2019 to recheck fluid levels. 91 day device clinic remote transmission5/20/2021.   Copy of ICM check sent to Dr.Allred and Dr Purvis Sheffield.  3 month ICM trend: 09/13/2019    1 Year ICM trend:       [name], RN 09/15/2019 2:16 PM

## 2019-09-15 NOTE — Telephone Encounter (Signed)
Remote ICM transmission received.  Attempted call to patient regarding ICM remote transmission and left message to return call   

## 2019-09-28 ENCOUNTER — Encounter: Payer: Self-pay | Admitting: Cardiology

## 2019-09-28 DIAGNOSIS — I25700 Atherosclerosis of coronary artery bypass graft(s), unspecified, with unstable angina pectoris: Secondary | ICD-10-CM | POA: Diagnosis not present

## 2019-09-28 DIAGNOSIS — E782 Mixed hyperlipidemia: Secondary | ICD-10-CM | POA: Diagnosis not present

## 2019-09-28 DIAGNOSIS — Z0000 Encounter for general adult medical examination without abnormal findings: Secondary | ICD-10-CM | POA: Diagnosis not present

## 2019-09-28 DIAGNOSIS — Z7189 Other specified counseling: Secondary | ICD-10-CM | POA: Diagnosis not present

## 2019-09-28 DIAGNOSIS — Z299 Encounter for prophylactic measures, unspecified: Secondary | ICD-10-CM | POA: Diagnosis not present

## 2019-09-28 DIAGNOSIS — Z1339 Encounter for screening examination for other mental health and behavioral disorders: Secondary | ICD-10-CM | POA: Diagnosis not present

## 2019-09-28 DIAGNOSIS — Z1211 Encounter for screening for malignant neoplasm of colon: Secondary | ICD-10-CM | POA: Diagnosis not present

## 2019-09-28 DIAGNOSIS — Z1331 Encounter for screening for depression: Secondary | ICD-10-CM | POA: Diagnosis not present

## 2019-09-28 DIAGNOSIS — Z79899 Other long term (current) drug therapy: Secondary | ICD-10-CM | POA: Diagnosis not present

## 2019-09-28 DIAGNOSIS — R5383 Other fatigue: Secondary | ICD-10-CM | POA: Diagnosis not present

## 2019-09-28 DIAGNOSIS — I10 Essential (primary) hypertension: Secondary | ICD-10-CM | POA: Diagnosis not present

## 2019-09-28 DIAGNOSIS — Z6822 Body mass index (BMI) 22.0-22.9, adult: Secondary | ICD-10-CM | POA: Diagnosis not present

## 2019-10-04 NOTE — Progress Notes (Signed)
No ICM remote transmission received for 09/30/2019 and next ICM transmission scheduled for 10/18/2019.

## 2019-10-08 ENCOUNTER — Ambulatory Visit (INDEPENDENT_AMBULATORY_CARE_PROVIDER_SITE_OTHER): Payer: Medicare HMO

## 2019-10-08 DIAGNOSIS — Z95810 Presence of automatic (implantable) cardiac defibrillator: Secondary | ICD-10-CM

## 2019-10-08 DIAGNOSIS — I5022 Chronic systolic (congestive) heart failure: Secondary | ICD-10-CM

## 2019-10-08 NOTE — Patient Instructions (Signed)
a 

## 2019-10-08 NOTE — Progress Notes (Signed)
EPIC Encounter for ICM Monitoring  Patient Name: Tracy Mills is a 74 y.o. female Date: 10/08/2019 Primary Care Physican: Ignatius Specking, MD Primary Cardiologist:Koneswaran Electrophysiologist: Allred Bi-V Pacing: 96% LastWeight:132lbs  Time in AT/AF  0.0 hr/day (0.0%)   Battery estimated RRT: 5 months    Transmission reviewed.   OptivolThoracic impedancereturned to normal since 09/13/2019 remote transmission.  TakesFurosemide 40 mgtake1 tablet daily  Labs: 11/24/2018 Creatinine 1.26, BUN 14, Potassium 4.6, Sodium 135, GFR 43-49 09/11/2018 Creatinine 1.19, BUN 16, Potassium 4.6, Sodium 135, GFR 45-54  Recommendations:none  Follow-up plan: ICM clinic phone appointment on6/21/2021. 91 day device clinic remote transmission5/24/2021.   Copy of ICM check sent to Dr.Allred.  3 month ICM trend: 10/05/2019    1 Year ICM trend:      [name], RN 10/08/2019 4:48 PM

## 2019-10-14 ENCOUNTER — Ambulatory Visit (INDEPENDENT_AMBULATORY_CARE_PROVIDER_SITE_OTHER): Payer: Medicare HMO | Admitting: *Deleted

## 2019-10-14 DIAGNOSIS — I255 Ischemic cardiomyopathy: Secondary | ICD-10-CM | POA: Diagnosis not present

## 2019-10-15 ENCOUNTER — Telehealth: Payer: Self-pay | Admitting: Cardiovascular Disease

## 2019-10-15 ENCOUNTER — Telehealth: Payer: Self-pay

## 2019-10-15 LAB — CUP PACEART REMOTE DEVICE CHECK
Battery Remaining Longevity: 5 mo
Battery Voltage: 2.8 V
Brady Statistic AP VP Percent: 31.99 %
Brady Statistic AP VS Percent: 0.02 %
Brady Statistic AS VP Percent: 67.73 %
Brady Statistic AS VS Percent: 0.26 %
Brady Statistic RA Percent Paced: 31.98 %
Brady Statistic RV Percent Paced: 98.01 %
Date Time Interrogation Session: 20210521104732
HighPow Impedance: 44 Ohm
HighPow Impedance: 52 Ohm
Implantable Lead Implant Date: 20110119
Implantable Lead Implant Date: 20110119
Implantable Lead Implant Date: 20150623
Implantable Lead Location: 753858
Implantable Lead Location: 753859
Implantable Lead Location: 753860
Implantable Lead Model: 4598
Implantable Lead Model: 5076
Implantable Lead Model: 6947
Implantable Pulse Generator Implant Date: 20150623
Lead Channel Impedance Value: 1007 Ohm
Lead Channel Impedance Value: 1026 Ohm
Lead Channel Impedance Value: 304 Ohm
Lead Channel Impedance Value: 399 Ohm
Lead Channel Impedance Value: 418 Ohm
Lead Channel Impedance Value: 456 Ohm
Lead Channel Impedance Value: 475 Ohm
Lead Channel Impedance Value: 475 Ohm
Lead Channel Impedance Value: 646 Ohm
Lead Channel Impedance Value: 646 Ohm
Lead Channel Impedance Value: 665 Ohm
Lead Channel Impedance Value: 665 Ohm
Lead Channel Impedance Value: 874 Ohm
Lead Channel Pacing Threshold Amplitude: 0.5 V
Lead Channel Pacing Threshold Amplitude: 1 V
Lead Channel Pacing Threshold Amplitude: 2 V
Lead Channel Pacing Threshold Pulse Width: 0.4 ms
Lead Channel Pacing Threshold Pulse Width: 0.4 ms
Lead Channel Pacing Threshold Pulse Width: 0.8 ms
Lead Channel Sensing Intrinsic Amplitude: 2 mV
Lead Channel Sensing Intrinsic Amplitude: 2 mV
Lead Channel Sensing Intrinsic Amplitude: 23.125 mV
Lead Channel Sensing Intrinsic Amplitude: 23.125 mV
Lead Channel Setting Pacing Amplitude: 2 V
Lead Channel Setting Pacing Amplitude: 2.5 V
Lead Channel Setting Pacing Amplitude: 3 V
Lead Channel Setting Pacing Pulse Width: 0.4 ms
Lead Channel Setting Pacing Pulse Width: 0.8 ms
Lead Channel Setting Sensing Sensitivity: 0.3 mV

## 2019-10-15 NOTE — Telephone Encounter (Signed)
Left message for patient to remind of missed remote transmission.  

## 2019-10-15 NOTE — Telephone Encounter (Signed)
    1. Has your device fired?   2. Is you device beeping?   3. Are you experiencing draining or swelling at device site?   4. Are you calling to see if we received your device transmission? Pt said will resend her transmission today and would like a callback to confirm if we receive it  5. Have you passed out?     Please route to Device Clinic Pool

## 2019-10-15 NOTE — Telephone Encounter (Signed)
Monthly battery check transmission not received on 10/14/19, but manual transmission was received 10/15/19. No episodes. Estimated remaining battery longevity 4 months.   Pt made aware of results. Advised will continue monthly battery checks at this time. Pt verbalizes understanding and agreement with plan.

## 2019-10-15 NOTE — Telephone Encounter (Signed)
I spoke with the pt to let her know we did receive the transmission. She asked about the results.I told her I do not read it but the nurse will review it and if everything looks good she will not get a call back. She will only get a call if something is on it that needs to be discussed.

## 2019-10-16 LAB — CUP PACEART REMOTE DEVICE CHECK
Battery Remaining Longevity: 5 mo
Battery Voltage: 2.8 V
Brady Statistic AP VP Percent: 31.99 %
Brady Statistic AP VS Percent: 0.02 %
Brady Statistic AS VP Percent: 67.73 %
Brady Statistic AS VS Percent: 0.26 %
Brady Statistic RA Percent Paced: 31.98 %
Brady Statistic RV Percent Paced: 98.01 %
Date Time Interrogation Session: 20210521104732
HighPow Impedance: 44 Ohm
HighPow Impedance: 52 Ohm
Implantable Lead Implant Date: 20110119
Implantable Lead Implant Date: 20110119
Implantable Lead Implant Date: 20150623
Implantable Lead Location: 753858
Implantable Lead Location: 753859
Implantable Lead Location: 753860
Implantable Lead Model: 4598
Implantable Lead Model: 5076
Implantable Lead Model: 6947
Implantable Pulse Generator Implant Date: 20150623
Lead Channel Impedance Value: 1007 Ohm
Lead Channel Impedance Value: 1026 Ohm
Lead Channel Impedance Value: 304 Ohm
Lead Channel Impedance Value: 399 Ohm
Lead Channel Impedance Value: 418 Ohm
Lead Channel Impedance Value: 456 Ohm
Lead Channel Impedance Value: 475 Ohm
Lead Channel Impedance Value: 475 Ohm
Lead Channel Impedance Value: 646 Ohm
Lead Channel Impedance Value: 646 Ohm
Lead Channel Impedance Value: 665 Ohm
Lead Channel Impedance Value: 665 Ohm
Lead Channel Impedance Value: 874 Ohm
Lead Channel Pacing Threshold Amplitude: 0.5 V
Lead Channel Pacing Threshold Amplitude: 1 V
Lead Channel Pacing Threshold Amplitude: 2 V
Lead Channel Pacing Threshold Pulse Width: 0.4 ms
Lead Channel Pacing Threshold Pulse Width: 0.4 ms
Lead Channel Pacing Threshold Pulse Width: 0.8 ms
Lead Channel Sensing Intrinsic Amplitude: 2 mV
Lead Channel Sensing Intrinsic Amplitude: 2 mV
Lead Channel Sensing Intrinsic Amplitude: 23.125 mV
Lead Channel Sensing Intrinsic Amplitude: 23.125 mV
Lead Channel Setting Pacing Amplitude: 2 V
Lead Channel Setting Pacing Amplitude: 2.5 V
Lead Channel Setting Pacing Amplitude: 3 V
Lead Channel Setting Pacing Pulse Width: 0.4 ms
Lead Channel Setting Pacing Pulse Width: 0.8 ms
Lead Channel Setting Sensing Sensitivity: 0.3 mV

## 2019-10-18 NOTE — Progress Notes (Addendum)
Cardiology Office Note  Date: 10/20/2019   ID: Tracy Mills, DOB [birth year], MRN [id number]  PCP:  Tracy Chroman, MD  Cardiologist:  Tracy Sable, MD Electrophysiologist:  Tracy Grayer, MD   Chief Complaint:  F/U Chronic systolic HF  History of Present Illness: Tracy Mills is a 74 y.o. female with a history of  Chronic combined systolic/diastolic HF,CAD, HTN, ICM (s/p BIVICD), Tobacco abuse, VF.  Last encounter  Via telemedicine [name] 04/02/2019. Chronic HFrEF stable symptoms.  Normal remote device function. Longevity 11 months. No recent arrhythmias, HTN stable.  Last saw [name] via telemedicine 12/10/2018: thoracic impedance was abnormal 10/2018 but trending toward baseline. She denied CP, palpitations, LE edema, PND, orthopnea.   Patient states she is doing well from a cardiac standpoint.  She denies any progressive anginal or exertional symptoms, palpitations or arrhythmias, orthostatic symptoms, CVA or TIA-like symptoms, bleeding in stool or urine.  Claudication or DVT/PE symptoms.  No lower extremity edema.  Follows Dr. Rayann Mills for her BiV ICD.  Recent check was normal.  States she had some recent lab work at PCP office and was advised that labs would be sent.  We do not have that information in our system.  We will attempt to obtain those from PCP.  Patient states he is having some issues with allergies but otherwise in no complaints other than some mild pain in her left ankle area.  Past Medical History:  Diagnosis Date  . Chronic systolic congestive heart failure ([name])   . Coronary artery disease    anterior MI in 2010. LAD stent placement 2010. EF: 15%  . Hypertension   . Ischemic cardiomyopathy   . LBBB (left bundle branch block)   . MR (mitral regurgitation)    moderate to severe  . Tobacco abuse   . VF (ventricular fibrillation) ([place]) 2013   nonsustained VF, spontaneously termianted    Past Surgical History:  Procedure Laterality Date  .  BI-VENTRICULAR IMPLANTABLE CARDIOVERTER DEFIBRILLATOR UPGRADE  11-16-2013   upgrade of previously implanted dual chamber ICD to MDT Hillery Aldo XT CRTD by [name]  . BI-VENTRICULAR IMPLANTABLE CARDIOVERTER DEFIBRILLATOR UPGRADE N/A 11/16/2013   Procedure: BI-VENTRICULAR IMPLANTABLE CARDIOVERTER DEFIBRILLATOR UPGRADE;  Surgeon: Coralyn Mark, MD;  Location[facility] CATH LAB;  Service: Cardiovascular;  Laterality: N/A;  . CARDIAC CATHETERIZATION  2010   LAD PCI and stent placement  . CARDIAC DEFIBRILLATOR PLACEMENT  06/14/09   MDT [facility] [name]  . TONSILLECTOMY AND ADENOIDECTOMY    . TUBAL LIGATION      Current Outpatient Medications  Medication Sig Dispense Refill  . acetaminophen (TYLENOL) 500 MG tablet Take 500 mg by mouth as needed.     Marland Kitchen aspirin 81 MG tablet Take 81 mg by mouth daily.      . carvedilol (COREG) 3.125 MG tablet TAKE 1 TABLET BY MOUTH 2 (TWO) TIMES DAILY. 180 tablet 3  . cetirizine (ZYRTEC) 10 MG tablet Take 10 mg by mouth daily.    . furosemide (LASIX) 40 MG tablet TAKE 1 TABLET (40 MG) BY MOUTH DAILY. 90 tablet 3  . nitroGLYCERIN (NITROSTAT) 0.4 MG SL tablet Place 1 tablet (0.4 mg total) under the tongue every 5 (five) minutes x 3 doses as needed for chest pain (if no relief afterwards, proceed to the ED for an evaluation or call 911). 25 tablet 3  . rosuvastatin (CRESTOR) 20 MG tablet Take 1 tablet (20 mg total) by mouth daily. 90 tablet 1  .  sacubitril-valsartan (ENTRESTO) 24-26 MG Take 1 tablet by mouth 2 (two) times daily. 180 tablet 3  . spironolactone (ALDACTONE) 25 MG tablet TAKE 1 TABLET (25 MG) BY MOUTH DAILY. 90 tablet 1   No current facility-administered medications for this visit.   Allergies:  Penicillins, Sulfa antibiotics, and Tetracyclines & related   Social History: The patient  reports that she quit smoking about 3 years ago. Her smoking use included cigarettes. She started smoking about 53 years ago. She has a 9.00 pack-year smoking history. She has never  used smokeless tobacco. She reports previous alcohol use. She reports that she does not use drugs.   Family History: The patient's family history includes Diabetes type II in her mother; Heart attack (age of onset: 65) in her brother; Other in her brother and father; Seizures in her sister.   ROS:  Please see the history of present illness. Otherwise, complete review of systems is positive for none.  All other systems are reviewed and negative.   Physical Exam: VS:  BP 120/68   Pulse 62   Ht 5\' 3"  (1.6 m)   Wt 123 lb 11.2 oz (56.1 kg)   SpO2 98%   BMI 21.91 kg/m , BMI Body mass index is 21.91 kg/m.  Wt Readings from Last 3 Encounters:  10/20/19 123 lb 11.2 oz (56.1 kg)  12/10/18 124 lb (56.2 kg)  03/27/18 131 lb 12.8 oz (59.8 kg)    General: Patient appears comfortable at rest. Neck: Supple, no elevated JVP or carotid bruits, no thyromegaly. Lungs: Clear to auscultation, nonlabored breathing at rest. Cardiac: Regular rate and rhythm, no S3 or significant systolic murmur, no pericardial rub. Extremities: No pitting edema, distal pulses 2+. Skin: Warm and dry. Musculoskeletal: No kyphosis. Neuropsychiatric: Alert and oriented x3, affect grossly appropriate.  ECG:  An ECG dated 10/20/2019 was personally reviewed today and demonstrated:  AV sequential dual-chamber pacemaker rhythm rate of 64.  Patient has a BiV pacemaker  Recent Labwork: No results found for requested labs within last 8760 hours.  No results found for: CHOL, TRIG, HDL, CHOLHDL, VLDL, LDLCALC, LDLDIRECT  Other Studies Reviewed Today:  Pacemaker remote device check 10/06/2019 Scheduled remote reviewed. Normal device function. Vent sensing episodes show ST 120''s w/ PR interval shorter than programmed delays. Battery estimated 4 months, wireless transmitting.    Echocardiogram 07/09/2017 Study Conclusions   - Left ventricle: The cavity size was normal. Wall thickness was  increased in a pattern of mild LVH.  Systolic function was  severely reduced. The estimated ejection fraction was in the  range of 25% to 30%. No definite LV mural thrombus. There is  akinesis of the anteroseptal and apical myocardium. There is  akinesis of the apicalinferior myocardium. Features are  consistent with a pseudonormal left ventricular filling pattern,  with concomitant abnormal relaxation and increased filling  pressure (grade 2 diastolic dysfunction).  - Aortic valve: Mildly calcified annulus. Trileaflet.  - Mitral valve: Mildly calcified annulus. There was moderate  regurgitation.  - Right ventricle: Pacer wire or catheter noted in right ventricle.  - Atrial septum: No defect or patent foramen ovale was identified.  - Tricuspid valve: There was trivial regurgitation.  - Pulmonary arteries: PA peak pressure: 14 mm Hg (S).  - Pericardium, extracardiac: A small pericardial effusion was  identified anterior to the heart.  Assessment and Plan:  1. CAD in native artery   2. Chronic combined systolic (congestive) and diastolic (congestive) heart failure (HCC)   3. Essential hypertension  4. Hyperlipidemia LDL goal <70   5. Stage 3a chronic kidney disease    1. CAD in native artery Stent to LAD 2010.  Denies any recent progressive anginal or exertional symptoms continue aspirin 81 mg, nitroglycerin sublingual as needed for chest pain..  2. Chronic combined systolic (congestive) and diastolic (congestive) heart failure (HCC) Echo 25-30% Grade 2 DD echo 2019, continue Entresto 24/26 mg p.o. twice daily, spironolactone 25 mg daily.  Carvedilol 3.125 mg p.o. twice daily.,  Lasix 40 mg p.o. daily.  Get repeat echocardiogram to reassess LV function.  Please obtain recent lab work from PCP office.  Patient wants to review lab work with [place]  3. Essential hypertension Blood pressure well controlled on current medications with blood pressure today 120/68.  Continue spironolactone 25 mg, Lasix 40 mg,  carvedilol 3.125 mg p.o. twice daily.  4. Hyperlipidemia LDL goal <70 Lipid panel on 01/29/2020 showed total cholesterol 169, triglycerides 93, HDL 77, LDL 75. Continue Crestor 20 mg daily.  5. Stage 3a chronic kidney disease Lab work on 09/28/2019 showed a creatinine of 1.36 and GFR of 45. Follows with PCP.    Medication Adjustments/Labs and Tests Ordered: Current medicines are reviewed at length with the patient today.  Concerns regarding medicines are outlined above.   Disposition: Follow-up with Dr. Purvis Sheffield or APP 6 months  Signed, Rennis Harding, NP 10/20/2019 10:44 AM    [facility] at [facility] [address] [place], [place], [place] [address] Phone: [phone]; Fax: [phone]

## 2019-10-18 NOTE — Progress Notes (Signed)
Remote ICD transmission.   

## 2019-10-20 ENCOUNTER — Other Ambulatory Visit: Payer: Self-pay

## 2019-10-20 ENCOUNTER — Encounter: Payer: Self-pay | Admitting: *Deleted

## 2019-10-20 ENCOUNTER — Encounter: Payer: Self-pay | Admitting: Family Medicine

## 2019-10-20 ENCOUNTER — Ambulatory Visit (INDEPENDENT_AMBULATORY_CARE_PROVIDER_SITE_OTHER): Payer: Medicare HMO | Admitting: Family Medicine

## 2019-10-20 VITALS — BP 120/68 | HR 62 | Ht 63.0 in | Wt 123.7 lb

## 2019-10-20 DIAGNOSIS — I2510 Atherosclerotic heart disease of native coronary artery without angina pectoris: Secondary | ICD-10-CM | POA: Diagnosis not present

## 2019-10-20 DIAGNOSIS — I10 Essential (primary) hypertension: Secondary | ICD-10-CM

## 2019-10-20 DIAGNOSIS — I5042 Chronic combined systolic (congestive) and diastolic (congestive) heart failure: Secondary | ICD-10-CM

## 2019-10-20 DIAGNOSIS — E785 Hyperlipidemia, unspecified: Secondary | ICD-10-CM | POA: Diagnosis not present

## 2019-10-20 DIAGNOSIS — N1831 Chronic kidney disease, stage 3a: Secondary | ICD-10-CM

## 2019-10-20 NOTE — Patient Instructions (Signed)
Medication Instructions:  Your physician recommends that you continue on your current medications as directed. Please refer to the Current Medication list given to you today.  *If you need a refill on your cardiac medications before your next appointment, please call your pharmacy*   Lab Work: NONE   If you have labs (blood work) drawn today and your tests are completely normal, you will receive your results only by: . MyChart Message (if you have MyChart) OR . A paper copy in the mail If you have any lab test that is abnormal or we need to change your treatment, we will call you to review the results.   Testing/Procedures: Your physician has requested that you have an echocardiogram. Echocardiography is a painless test that uses sound waves to create images of your heart. It provides your doctor with information about the size and shape of your heart and how well your heart's chambers and valves are working. This procedure takes approximately one hour. There are no restrictions for this procedure.     Follow-Up: At [facility], you and your health needs are our priority.  As part of our continuing mission to provide you with exceptional heart care, we have created designated Provider Care Teams.  These Care Teams include your primary Cardiologist (physician) and Advanced Practice Providers (APPs -  Physician Assistants and Nurse Practitioners) who all work together to provide you with the care you need, when you need it.  We recommend signing up for the patient portal called "MyChart".  Sign up information is provided on this After Visit Summary.  MyChart is used to connect with patients for Virtual Visits (Telemedicine).  Patients are able to view lab/test results, encounter notes, upcoming appointments, etc.  Non-urgent messages can be sent to your provider as well.   To learn more about what you can do with MyChart, go to https://www.mychart.com.    Your next appointment:   3  month(s)  The format for your next appointment:   In Person  Provider:   Suresh Koneswaran, MD   Other Instructions Thank you for choosing [facility]!    

## 2019-10-21 NOTE — Addendum Note (Signed)
Addended by: Burman Nieves T on: 10/21/2019 10:14 AM   Modules accepted: Orders

## 2019-11-15 ENCOUNTER — Ambulatory Visit (INDEPENDENT_AMBULATORY_CARE_PROVIDER_SITE_OTHER): Payer: Medicare Other | Admitting: *Deleted

## 2019-11-15 ENCOUNTER — Ambulatory Visit (INDEPENDENT_AMBULATORY_CARE_PROVIDER_SITE_OTHER): Payer: Medicare HMO

## 2019-11-15 DIAGNOSIS — I5022 Chronic systolic (congestive) heart failure: Secondary | ICD-10-CM

## 2019-11-15 DIAGNOSIS — Z95810 Presence of automatic (implantable) cardiac defibrillator: Secondary | ICD-10-CM

## 2019-11-15 DIAGNOSIS — I255 Ischemic cardiomyopathy: Secondary | ICD-10-CM

## 2019-11-15 LAB — CUP PACEART REMOTE DEVICE CHECK
Battery Remaining Longevity: 4 mo
Battery Voltage: 2.78 V
Brady Statistic AP VP Percent: 56.5 %
Brady Statistic AP VS Percent: 0.03 %
Brady Statistic AS VP Percent: 43.3 %
Brady Statistic AS VS Percent: 0.16 %
Brady Statistic RA Percent Paced: 56.02 %
Brady Statistic RV Percent Paced: 98.73 %
Date Time Interrogation Session: 20210621052603
HighPow Impedance: 43 Ohm
HighPow Impedance: 53 Ohm
Implantable Lead Implant Date: 20110119
Implantable Lead Implant Date: 20110119
Implantable Lead Implant Date: 20150623
Implantable Lead Location: 753858
Implantable Lead Location: 753859
Implantable Lead Location: 753860
Implantable Lead Model: 4598
Implantable Lead Model: 5076
Implantable Lead Model: 6947
Implantable Pulse Generator Implant Date: 20150623
Lead Channel Impedance Value: 1121 Ohm
Lead Channel Impedance Value: 1140 Ohm
Lead Channel Impedance Value: 361 Ohm
Lead Channel Impedance Value: 361 Ohm
Lead Channel Impedance Value: 418 Ohm
Lead Channel Impedance Value: 456 Ohm
Lead Channel Impedance Value: 532 Ohm
Lead Channel Impedance Value: 532 Ohm
Lead Channel Impedance Value: 722 Ohm
Lead Channel Impedance Value: 722 Ohm
Lead Channel Impedance Value: 760 Ohm
Lead Channel Impedance Value: 779 Ohm
Lead Channel Impedance Value: 988 Ohm
Lead Channel Pacing Threshold Amplitude: 0.5 V
Lead Channel Pacing Threshold Amplitude: 0.875 V
Lead Channel Pacing Threshold Amplitude: 2.25 V
Lead Channel Pacing Threshold Pulse Width: 0.4 ms
Lead Channel Pacing Threshold Pulse Width: 0.4 ms
Lead Channel Pacing Threshold Pulse Width: 0.8 ms
Lead Channel Sensing Intrinsic Amplitude: 1.75 mV
Lead Channel Sensing Intrinsic Amplitude: 1.75 mV
Lead Channel Sensing Intrinsic Amplitude: 24.125 mV
Lead Channel Sensing Intrinsic Amplitude: 24.125 mV
Lead Channel Setting Pacing Amplitude: 2 V
Lead Channel Setting Pacing Amplitude: 2.5 V
Lead Channel Setting Pacing Amplitude: 3.25 V
Lead Channel Setting Pacing Pulse Width: 0.4 ms
Lead Channel Setting Pacing Pulse Width: 0.8 ms
Lead Channel Setting Sensing Sensitivity: 0.3 mV

## 2019-11-16 ENCOUNTER — Other Ambulatory Visit: Payer: Self-pay | Admitting: Cardiovascular Disease

## 2019-11-16 DIAGNOSIS — E785 Hyperlipidemia, unspecified: Secondary | ICD-10-CM

## 2019-11-16 NOTE — Progress Notes (Signed)
Remote ICD transmission.   

## 2019-11-17 NOTE — Progress Notes (Signed)
EPIC Encounter for ICM Monitoring  Patient Name: Tracy Mills is a 74 y.o. female Date: 11/17/2019 Primary Care Physican: Tracy Specking, MD Primary Cardiologist:Tracy Mills Electrophysiologist: Tracy Mills Bi-V Pacing: 97.8% 6/23/2021Weight:132lbs  Time in AT/AF 0.0 hr/day (0.0%)   Battery estimated RRT:20months    Spoke with patient and reports feeling well at this time.  Denies fluid symptoms.  Explained she will hear an audible tone when battery needs to replaced but there will be 3 months left.           Advised to call office next business day when she hears the tone so an office appt with Tracy Mills can be scheduled to discuss replacement.Marland Kitchen  OptivolThoracic impedancenormal.  TakesFurosemide 40 mgtake1 tablet daily  Labs: 11/24/2018 Creatinine 1.26, BUN 14, Potassium 4.6, Sodium 135, GFR 43-49 09/11/2018 Creatinine 1.19, BUN 16, Potassium 4.6, Sodium 135, GFR 45-54  Recommendations:No changes and encouraged to call if experiencing any fluid symptoms.  Follow-up plan: ICM clinic phone appointment on7/26/2021. 91 day device clinic remote transmission5/24/2021.   Copy of ICM check sent to Tracy.Allred.  3 month ICM trend: 11/17/2019    1 Year ICM trend:       Tracy Soda, RN 11/17/2019 5:00 PM

## 2019-11-18 ENCOUNTER — Other Ambulatory Visit: Payer: Self-pay | Admitting: *Deleted

## 2019-11-18 ENCOUNTER — Other Ambulatory Visit: Payer: Self-pay

## 2019-11-18 ENCOUNTER — Ambulatory Visit (INDEPENDENT_AMBULATORY_CARE_PROVIDER_SITE_OTHER): Payer: Medicare HMO

## 2019-11-18 DIAGNOSIS — I5042 Chronic combined systolic (congestive) and diastolic (congestive) heart failure: Secondary | ICD-10-CM | POA: Diagnosis not present

## 2019-11-18 DIAGNOSIS — I5022 Chronic systolic (congestive) heart failure: Secondary | ICD-10-CM

## 2019-11-18 DIAGNOSIS — E785 Hyperlipidemia, unspecified: Secondary | ICD-10-CM

## 2019-11-18 MED ORDER — ROSUVASTATIN CALCIUM 20 MG PO TABS: 20.0000 mg | ORAL_TABLET | Freq: Every day | ORAL | 1 refills | Status: DC

## 2019-11-18 MED ORDER — CARVEDILOL 3.125 MG PO TABS: ORAL_TABLET | ORAL | 1 refills | Status: DC

## 2019-11-18 MED ORDER — SACUBITRIL-VALSARTAN 24-26 MG PO TABS: 1.0000 | ORAL_TABLET | Freq: Two times a day (BID) | ORAL | 1 refills | Status: DC

## 2019-11-18 MED ORDER — FUROSEMIDE 40 MG PO TABS: ORAL_TABLET | ORAL | 1 refills | Status: DC

## 2019-11-18 MED ORDER — SPIRONOLACTONE 25 MG PO TABS: ORAL_TABLET | ORAL | 1 refills | Status: DC

## 2019-11-23 ENCOUNTER — Telehealth: Payer: Self-pay | Admitting: *Deleted

## 2019-11-23 MED ORDER — NITROGLYCERIN 0.4 MG SL SUBL: 0.4000 mg | SUBLINGUAL_TABLET | SUBLINGUAL | 1 refills | Status: DC | PRN

## 2019-11-23 NOTE — Telephone Encounter (Signed)
-----   Message from Netta Neat., NP sent at 11/21/2019  5:15 PM EDT ----- Please call the patient and tell her the echocardiogram showed the pumping function is the same as it was on the 2019 echocardiogram 25-30%. Tell her the stiffness in her left ventricle has improved from the last echo in 2019. Her mitral valve leaking is unchanged from last echo showing mild to moderate leaking. Thank You

## 2019-11-23 NOTE — Telephone Encounter (Signed)
Patient informed. Copy sent to PCP °

## 2019-12-20 ENCOUNTER — Ambulatory Visit (INDEPENDENT_AMBULATORY_CARE_PROVIDER_SITE_OTHER): Payer: Medicare HMO

## 2019-12-20 DIAGNOSIS — I5022 Chronic systolic (congestive) heart failure: Secondary | ICD-10-CM

## 2019-12-20 DIAGNOSIS — Z95810 Presence of automatic (implantable) cardiac defibrillator: Secondary | ICD-10-CM | POA: Diagnosis not present

## 2019-12-22 NOTE — Progress Notes (Signed)
EPIC Encounter for ICM Monitoring  Patient Name: Tracy Mills is a 74 y.o. female Date: 12/22/2019 Primary Care Physican: Ignatius Specking, MD Primary Cardiologist:Koneswaran Electrophysiologist: Allred Bi-V Pacing: 99.6% 7/28/2021Weight:132lbs  Time in AT/AF 0.0hr/day (0.0%)   Battery estimated RRT:78months   Spoke with patient and reports feeling well at this time.  Denies fluid symptoms.  Advised of device alert sound when battery reaches replacement time.         OptivolThoracic impedancenormal.  TakesFurosemide 40 mgtake1 tablet daily  Labs: 09/28/2019 Creatinine 1.36, BUN 18, Potassium 4.5, Sodium 139, GFR 39-45 A complete set of results can be found in Results Review.  Recommendations: No changes and encouraged to call if experiencing any fluid symptoms.  Follow-up plan: ICM clinic phone appointment on 01/24/2020.   91 day device clinic remote transmission 04/13/2020.    EP/Cardiology Office Visits: 03/31/2020 with Dr. Johney Frame.  01/25/2020 OV with Dr Purvis Sheffield was canceled by the office and needs to be rescheduled since last OV was 12/10/2018.  Copy of ICM check sent to Dr. Johney Frame.   3 month ICM trend: 12/20/2019    1 Year ICM trend:       [name], RN 12/22/2019 9:31 AM

## 2020-01-13 ENCOUNTER — Ambulatory Visit (INDEPENDENT_AMBULATORY_CARE_PROVIDER_SITE_OTHER): Payer: Medicare HMO | Admitting: *Deleted

## 2020-01-13 DIAGNOSIS — I255 Ischemic cardiomyopathy: Secondary | ICD-10-CM | POA: Diagnosis not present

## 2020-01-14 LAB — CUP PACEART REMOTE DEVICE CHECK
Battery Remaining Longevity: 2 mo
Battery Voltage: 2.75 V
Brady Statistic AP VP Percent: 68.27 %
Brady Statistic AP VS Percent: 0.04 %
Brady Statistic AS VP Percent: 31.55 %
Brady Statistic AS VS Percent: 0.14 %
Brady Statistic RA Percent Paced: 67.25 %
Brady Statistic RV Percent Paced: 97.23 %
Date Time Interrogation Session: 20210820003326
HighPow Impedance: 46 Ohm
HighPow Impedance: 58 Ohm
Implantable Lead Implant Date: 20110119
Implantable Lead Implant Date: 20110119
Implantable Lead Implant Date: 20150623
Implantable Lead Location: 753858
Implantable Lead Location: 753859
Implantable Lead Location: 753860
Implantable Lead Model: 4598
Implantable Lead Model: 5076
Implantable Lead Model: 6947
Implantable Pulse Generator Implant Date: 20150623
Lead Channel Impedance Value: 1026 Ohm
Lead Channel Impedance Value: 1197 Ohm
Lead Channel Impedance Value: 1197 Ohm
Lead Channel Impedance Value: 361 Ohm
Lead Channel Impedance Value: 399 Ohm
Lead Channel Impedance Value: 456 Ohm
Lead Channel Impedance Value: 456 Ohm
Lead Channel Impedance Value: 589 Ohm
Lead Channel Impedance Value: 589 Ohm
Lead Channel Impedance Value: 779 Ohm
Lead Channel Impedance Value: 817 Ohm
Lead Channel Impedance Value: 817 Ohm
Lead Channel Impedance Value: 836 Ohm
Lead Channel Pacing Threshold Amplitude: 0.5 V
Lead Channel Pacing Threshold Amplitude: 1 V
Lead Channel Pacing Threshold Amplitude: 2.25 V
Lead Channel Pacing Threshold Pulse Width: 0.4 ms
Lead Channel Pacing Threshold Pulse Width: 0.4 ms
Lead Channel Pacing Threshold Pulse Width: 0.8 ms
Lead Channel Sensing Intrinsic Amplitude: 1.875 mV
Lead Channel Sensing Intrinsic Amplitude: 1.875 mV
Lead Channel Sensing Intrinsic Amplitude: 19.875 mV
Lead Channel Sensing Intrinsic Amplitude: 19.875 mV
Lead Channel Setting Pacing Amplitude: 2 V
Lead Channel Setting Pacing Amplitude: 2.5 V
Lead Channel Setting Pacing Amplitude: 3.25 V
Lead Channel Setting Pacing Pulse Width: 0.4 ms
Lead Channel Setting Pacing Pulse Width: 0.8 ms
Lead Channel Setting Sensing Sensitivity: 0.3 mV

## 2020-01-17 NOTE — Progress Notes (Signed)
Remote ICD transmission.   

## 2020-01-24 ENCOUNTER — Telehealth: Payer: Self-pay

## 2020-01-24 ENCOUNTER — Ambulatory Visit (INDEPENDENT_AMBULATORY_CARE_PROVIDER_SITE_OTHER): Payer: Medicare HMO

## 2020-01-24 DIAGNOSIS — Z95810 Presence of automatic (implantable) cardiac defibrillator: Secondary | ICD-10-CM

## 2020-01-24 DIAGNOSIS — I5022 Chronic systolic (congestive) heart failure: Secondary | ICD-10-CM | POA: Diagnosis not present

## 2020-01-24 NOTE — Progress Notes (Signed)
EPIC Encounter for ICM Monitoring  Patient Name: Tracy Mills is a 74 y.o. female Date: 01/24/2020 Primary Care Physican: Ignatius Specking, MD Primary Cardiologist:Koneswaran Electrophysiologist: Allred Bi-V Pacing: 93.5% 7/28/2021Weight:132lbs  Clinical Status (14-Jan-2020 to 21-Jan-2020) AT/AF 2 Time in AT/AF <0.1 hr/day (0.1%) Longest AT/AF 11 minutes  Battery estimated RRT:40months   Attempted call to patient and unable to reach.   Transmission reviewed.    OptivolThoracic impedancenormal.  TakesFurosemide 40 mgtake1 tablet daily  Labs: 09/28/2019 Creatinine 1.36, BUN 18, Potassium 4.5, Sodium 139, GFR 39-45 A complete set of results can be found in Results Review.  Recommendations:  Unable to reach.    Follow-up plan: ICM clinic phone appointment on 02/28/2020.   91 day device clinic remote transmission 04/13/2020.    EP/Cardiology Office Visits: 03/31/2020 with Dr. Johney Frame.    Copy of ICM check sent to Dr. Johney Frame.   3 month ICM trend: 01/24/2020    1 Year ICM trend:       [name], RN 01/24/2020 4:29 PM

## 2020-01-24 NOTE — Telephone Encounter (Signed)
Remote ICM transmission received.  Attempted call to patient regarding ICM remote transmission and unable to leave message due to mail box is full.

## 2020-01-25 ENCOUNTER — Ambulatory Visit: Payer: Medicare HMO | Admitting: Cardiovascular Disease

## 2020-01-25 ENCOUNTER — Encounter: Payer: Self-pay | Admitting: Family Medicine

## 2020-01-25 ENCOUNTER — Other Ambulatory Visit: Payer: Self-pay

## 2020-01-25 ENCOUNTER — Ambulatory Visit (INDEPENDENT_AMBULATORY_CARE_PROVIDER_SITE_OTHER): Payer: Medicare HMO | Admitting: Family Medicine

## 2020-01-25 VITALS — BP 124/72 | HR 65 | Ht 63.0 in | Wt 123.2 lb

## 2020-01-25 DIAGNOSIS — E782 Mixed hyperlipidemia: Secondary | ICD-10-CM

## 2020-01-25 DIAGNOSIS — N1831 Chronic kidney disease, stage 3a: Secondary | ICD-10-CM | POA: Diagnosis not present

## 2020-01-25 DIAGNOSIS — I2510 Atherosclerotic heart disease of native coronary artery without angina pectoris: Secondary | ICD-10-CM | POA: Diagnosis not present

## 2020-01-25 DIAGNOSIS — I5042 Chronic combined systolic (congestive) and diastolic (congestive) heart failure: Secondary | ICD-10-CM

## 2020-01-25 DIAGNOSIS — I10 Essential (primary) hypertension: Secondary | ICD-10-CM

## 2020-01-25 DIAGNOSIS — Z95810 Presence of automatic (implantable) cardiac defibrillator: Secondary | ICD-10-CM

## 2020-01-25 NOTE — Patient Instructions (Addendum)
Medication Instructions:  Continue all current medications.   Labwork: none  Testing/Procedures: none  Follow-Up: 6 months   Any Other Special Instructions Will Be Listed Below (If Applicable).   If you need a refill on your cardiac medications before your next appointment, please call your pharmacy.  

## 2020-01-25 NOTE — Progress Notes (Signed)
Cardiology Office Note  Date: 01/25/2020   ID: Tracy Mills, DOB [birth year], MRN [id number]  PCP:  Ignatius Specking, MD  Cardiologist:  Prentice Docker, MD (Inactive) Electrophysiologist:  Hillis Range, MD   Chief Complaint:  F/U Chronic systolic HF  History of Present Illness: Tracy Mills is a 74 y.o. female with a history of  Chronic combined systolic/diastolic HF,CAD, HTN, ICM (s/p BIVICD), Tobacco abuse, VF.  Last encounter  Via telemedicine Dr Johney Frame 04/02/2019. Chronic HFrEF stable symptoms.  Normal remote device function. Longevity 11 months. No recent arrhythmias, HTN stable.  Last saw Dr Purvis Sheffield via telemedicine 12/10/2018: thoracic impedance was abnormal 10/2018 but trending toward baseline. She denied CP, palpitations, LE edema, PND, orthopnea.   At last visit patient stated she was doing well from a cardiac standpoint.  She denied any progressive anginal or exertional symptoms, palpitations or arrhythmias, orthostatic symptoms, CVA or TIA-like symptoms, bleeding in stool or urine.  Claudication or DVT/PE symptoms.  No lower extremity edema.  Follows Dr. Johney Frame for her BiV ICD.  Recent check was normal.  Stated she had some recent lab work at PCP office and was advised that labs would be sent.  We do not have that information in our system.  We will attempt to obtain those from PCP.  Patient stated she was having some issues with allergies but otherwise in no complaints other than some mild pain in her left ankle area.  Patient denies any recent issues since last visit on Oct 20, 2019.  Repeat echo showed no significant changes.Echocardiogram 11/18/2019 showed EF 25 to 30%.  Positive for R WMA's, G1 DD.  Mild to moderate MR, trivial AR.  She denies any anginal or exertional symptoms, palpitations or arrhythmias, orthostatic symptoms, stroke or TIA-like symptoms, bleeding, PND, orthopnea, lightheadedness, dizziness, presyncope or syncopal episodes.  No claudication-like  symptoms, DVT or PE-like symptoms, or lower extremity edema.  She states she is concerned about her generator for BiV ICD life nearing end-of-life.  At last check patient had approximately 2 months of battery life left.  Patient states she is not heard from the device clinic about when this will be replaced and she is getting worried.  It appears that Melvenia Beam, RN attempted to contact her yesterday.  Patient states she is going to call Lawson Fiscal back as soon as she gets home today regarding the phone call and ask about when ICD generator will be replaced.   Past Medical History:  Diagnosis Date  . Chronic systolic congestive heart failure (HCC)   . Coronary artery disease    anterior MI in 2010. LAD stent placement 2010. EF: 15%  . Hypertension   . Ischemic cardiomyopathy   . LBBB (left bundle branch block)   . MR (mitral regurgitation)    moderate to severe  . Tobacco abuse   . VF (ventricular fibrillation) (HCC) 2013   nonsustained VF, spontaneously termianted    Past Surgical History:  Procedure Laterality Date  . BI-VENTRICULAR IMPLANTABLE CARDIOVERTER DEFIBRILLATOR UPGRADE  11-16-2013   upgrade of previously implanted dual chamber ICD to MDT Ovidio Kin XT CRTD by Dr Johney Frame  . BI-VENTRICULAR IMPLANTABLE CARDIOVERTER DEFIBRILLATOR UPGRADE N/A 11/16/2013   Procedure: BI-VENTRICULAR IMPLANTABLE CARDIOVERTER DEFIBRILLATOR UPGRADE;  Surgeon: Gardiner Rhyme, MD;  Location: [facility] CATH LAB;  Service: Cardiovascular;  Laterality: N/A;  . CARDIAC CATHETERIZATION  2010   LAD PCI and stent placement  . CARDIAC DEFIBRILLATOR PLACEMENT  06/14/09   MDT [place] Dr Johney Frame  .  TONSILLECTOMY AND ADENOIDECTOMY    . TUBAL LIGATION      Current Outpatient Medications  Medication Sig Dispense Refill  . acetaminophen (TYLENOL) 500 MG tablet Take 500 mg by mouth as needed.     Marland Kitchen aspirin 81 MG tablet Take 81 mg by mouth daily.      . carvedilol (COREG) 3.125 MG tablet TAKE 1 TABLET BY MOUTH 2 (TWO) TIMES DAILY.  180 tablet 1  . cetirizine (ZYRTEC) 10 MG tablet Take 10 mg by mouth daily.    . furosemide (LASIX) 40 MG tablet TAKE 1 TABLET (40 MG) BY MOUTH DAILY. 90 tablet 1  . nitroGLYCERIN (NITROSTAT) 0.4 MG SL tablet Place 1 tablet (0.4 mg total) under the tongue every 5 (five) minutes x 3 doses as needed for chest pain (if no relief afterwards, proceed to the ED for an evaluation or call 911). 75 tablet 1  . rosuvastatin (CRESTOR) 20 MG tablet Take 1 tablet (20 mg total) by mouth daily. 90 tablet 1  . sacubitril-valsartan (ENTRESTO) 24-26 MG Take 1 tablet by mouth 2 (two) times daily. 180 tablet 1  . spironolactone (ALDACTONE) 25 MG tablet TAKE 1 TABLET BY MOUTH  DAILY 90 tablet 1   No current facility-administered medications for this visit.   Allergies:  Penicillins, Sulfa antibiotics, and Tetracyclines & related   Social History: The patient  reports that she quit smoking about 3 years ago. Her smoking use included cigarettes. She started smoking about 53 years ago. She has a 9.00 pack-year smoking history. She has never used smokeless tobacco. She reports previous alcohol use. She reports that she does not use drugs.   Family History: The patient's family history includes Diabetes type II in her mother; Heart attack (age of onset: 59) in her brother; Other in her brother and father; Seizures in her sister.   ROS:  Please see the history of present illness. Otherwise, complete review of systems is positive for none.  All other systems are reviewed and negative.   Physical Exam: VS:  BP 124/72   Pulse 65   Ht 5\' 3"  (1.6 m)   Wt 123 lb 3.2 oz (55.9 kg)   SpO2 98%   BMI 21.82 kg/m , BMI Body mass index is 21.82 kg/m.  Wt Readings from Last 3 Encounters:  01/25/20 123 lb 3.2 oz (55.9 kg)  10/20/19 123 lb 11.2 oz (56.1 kg)  12/10/18 124 lb (56.2 kg)    General: Patient appears comfortable at rest. Neck: Supple, no elevated JVP or carotid bruits, no thyromegaly. Lungs: Clear to auscultation,  nonlabored breathing at rest. Cardiac: Regular rate and rhythm, no S3 or significant systolic murmur, no pericardial rub. Extremities: No pitting edema, distal pulses 2+. Skin: Warm and dry. Musculoskeletal: No kyphosis. Neuropsychiatric: Alert and oriented x3, affect grossly appropriate.  ECG:  An ECG dated 10/20/2019 was personally reviewed today and demonstrated:  AV sequential dual-chamber pacemaker rhythm rate of 64.  Patient has a BiV pacemaker  Recent Labwork: No results found for requested labs within last 8760 hours.  No results found for: CHOL, TRIG, HDL, CHOLHDL, VLDL, LDLCALC, LDLDIRECT  Other Studies Reviewed Today:  Echocardiogram 11/18/2019 IMPRESSIONS  1. Left ventricular ejection fraction, by estimation, is 25 to 30%. The  left ventricle has severely decreased function. The left ventricle  demonstrates regional wall motion abnormalities (see scoring  diagram/findings for description). The left  ventricular internal cavity size was mildly dilated. Left ventricular  diastolic parameters are consistent with Grade I diastolic  dysfunction  (impaired relaxation).  2. Right ventricular systolic function is normal. The right ventricular  size is normal. A device wire is visualized. Tricuspid regurgitation  signal is inadequate for assessing PA pressure.  3. The mitral valve is grossly normal, mild annular calcification. Mild  to moderate mitral valve regurgitation.  4. The aortic valve is tricuspid. Aortic valve regurgitation is trivial.  5. The inferior vena cava is normal in size with greater than 50%  respiratory variability, suggesting right atrial pressure of 3 mmHg.   Pacemaker remote device check 10/06/2019 Scheduled remote reviewed. Normal device function. Vent sensing episodes show ST 120''s w/ PR interval shorter than programmed delays. Battery estimated 4 months, wireless transmitting.    Echocardiogram 07/09/2017 Study Conclusions   - Left ventricle: The  cavity size was normal. Wall thickness was  increased in a pattern of mild LVH. Systolic function was  severely reduced. The estimated ejection fraction was in the  range of 25% to 30%. No definite LV mural thrombus. There is  akinesis of the anteroseptal and apical myocardium. There is  akinesis of the apicalinferior myocardium. Features are  consistent with a pseudonormal left ventricular filling pattern,  with concomitant abnormal relaxation and increased filling  pressure (grade 2 diastolic dysfunction).  - Aortic valve: Mildly calcified annulus. Trileaflet.  - Mitral valve: Mildly calcified annulus. There was moderate  regurgitation.  - Right ventricle: Pacer wire or catheter noted in right ventricle.  - Atrial septum: No defect or patent foramen ovale was identified.  - Tricuspid valve: There was trivial regurgitation.  - Pulmonary arteries: PA peak pressure: 14 mm Hg (S).  - Pericardium, extracardiac: A small pericardial effusion was  identified anterior to the heart.  Assessment and Plan:   1. CAD in native artery Stent to LAD 2010.  Denies any recent progressive anginal or exertional symptoms continue aspirin 81 mg, nitroglycerin sublingual as needed for chest pain..   2. Chronic combined systolic (congestive) and diastolic (congestive) heart failure (HCC) Echocardiogram 11/18/2019 showed EF 25 to 30%.  Positive for R WMA's, G1 DD.  Mild to moderate MR, trivial AR.  Continue Entresto 24/26 mg p.o. twice daily, spironolactone 25 mg daily.  Carvedilol 3.125 mg p.o. twice daily.,  Lasix 40 mg p.o. daily.   3. Essential hypertension Blood pressure well controlled on current medications with blood pressure today 124/72 continue spironolactone 25 mg, Lasix 40 mg, carvedilol 3.125 mg p.o. twice daily.  4. Hyperlipidemia LDL goal <70 Lipid panel on 01/29/2019 showed total cholesterol 169, triglycerides 93, HDL 77, LDL 75. Continue Crestor 20 mg daily.  5. Stage 3a  chronic kidney disease Lab work on 09/28/2019 showed a creatinine of 1.36 and GFR of 45. Follows with PCP.    6. BiV ICD Recent epic encounter for ICM monitoring 01/24/2020 showed BiV pacing 93.5%.  Battery estimated RRT 2 months.  Optimized thoracic impedance was normal.  Staff attempted to call patient and mailbox was full.  I discussed this with patient and encouraged her to call back and ask about when she could anticipate generator change.  Medication Adjustments/Labs and Tests Ordered: Current medicines are reviewed at length with the patient today.  Concerns regarding medicines are outlined above.   Disposition: Follow-up with Dr. Wyline Mood or APP 6 months  Signed, Rennis Harding, NP 01/25/2020 12:09 PM    [facility] [address] [place], [place], [place] [address] Phone: [phone]; Fax: [phone]

## 2020-01-26 NOTE — Progress Notes (Signed)
Spoke with patient.  She said Rennis Harding, NP told her ICM was trying to reach her for monthly follow up and battery check.  She has some anxiety waiting for the battery to be replaced.  Reviewed the procedure for device battery replacement.  Advised when she hears a tone, it will send Alert and office will call her to schedule appointment with physician to discuss battery replacement.  Transmission reviewed.  No changes and encouraged to call if experiencing any fluid symptoms.

## 2020-02-08 DIAGNOSIS — I472 Ventricular tachycardia: Secondary | ICD-10-CM | POA: Diagnosis not present

## 2020-02-08 DIAGNOSIS — N183 Chronic kidney disease, stage 3 unspecified: Secondary | ICD-10-CM | POA: Diagnosis not present

## 2020-02-08 DIAGNOSIS — I509 Heart failure, unspecified: Secondary | ICD-10-CM | POA: Diagnosis not present

## 2020-02-08 DIAGNOSIS — I25700 Atherosclerosis of coronary artery bypass graft(s), unspecified, with unstable angina pectoris: Secondary | ICD-10-CM | POA: Diagnosis not present

## 2020-02-08 DIAGNOSIS — Z299 Encounter for prophylactic measures, unspecified: Secondary | ICD-10-CM | POA: Diagnosis not present

## 2020-02-08 DIAGNOSIS — I10 Essential (primary) hypertension: Secondary | ICD-10-CM | POA: Diagnosis not present

## 2020-02-10 ENCOUNTER — Telehealth: Payer: Self-pay | Admitting: Internal Medicine

## 2020-02-10 ENCOUNTER — Ambulatory Visit (INDEPENDENT_AMBULATORY_CARE_PROVIDER_SITE_OTHER): Payer: Medicare HMO | Admitting: *Deleted

## 2020-02-10 DIAGNOSIS — I255 Ischemic cardiomyopathy: Secondary | ICD-10-CM

## 2020-02-10 LAB — CUP PACEART REMOTE DEVICE CHECK
Battery Remaining Longevity: 1 mo
Battery Voltage: 2.68 V
Brady Statistic AP VP Percent: 50.73 %
Brady Statistic AP VS Percent: 0.03 %
Brady Statistic AS VP Percent: 49.14 %
Brady Statistic AS VS Percent: 0.1 %
Brady Statistic RA Percent Paced: 50.57 %
Brady Statistic RV Percent Paced: 97.19 %
Date Time Interrogation Session: 20210916133056
HighPow Impedance: 38 Ohm
HighPow Impedance: 48 Ohm
Implantable Lead Implant Date: 20110119
Implantable Lead Implant Date: 20110119
Implantable Lead Implant Date: 20150623
Implantable Lead Location: 753858
Implantable Lead Location: 753859
Implantable Lead Location: 753860
Implantable Lead Model: 4598
Implantable Lead Model: 5076
Implantable Lead Model: 6947
Implantable Pulse Generator Implant Date: 20150623
Lead Channel Impedance Value: 1007 Ohm
Lead Channel Impedance Value: 1026 Ohm
Lead Channel Impedance Value: 304 Ohm
Lead Channel Impedance Value: 361 Ohm
Lead Channel Impedance Value: 418 Ohm
Lead Channel Impedance Value: 418 Ohm
Lead Channel Impedance Value: 513 Ohm
Lead Channel Impedance Value: 532 Ohm
Lead Channel Impedance Value: 665 Ohm
Lead Channel Impedance Value: 665 Ohm
Lead Channel Impedance Value: 703 Ohm
Lead Channel Impedance Value: 722 Ohm
Lead Channel Impedance Value: 931 Ohm
Lead Channel Pacing Threshold Amplitude: 0.5 V
Lead Channel Pacing Threshold Amplitude: 1 V
Lead Channel Pacing Threshold Amplitude: 2.125 V
Lead Channel Pacing Threshold Pulse Width: 0.4 ms
Lead Channel Pacing Threshold Pulse Width: 0.4 ms
Lead Channel Pacing Threshold Pulse Width: 0.8 ms
Lead Channel Sensing Intrinsic Amplitude: 1.25 mV
Lead Channel Sensing Intrinsic Amplitude: 1.25 mV
Lead Channel Sensing Intrinsic Amplitude: 21.25 mV
Lead Channel Sensing Intrinsic Amplitude: 21.25 mV
Lead Channel Setting Pacing Amplitude: 2 V
Lead Channel Setting Pacing Amplitude: 2.5 V
Lead Channel Setting Pacing Amplitude: 3.25 V
Lead Channel Setting Pacing Pulse Width: 0.4 ms
Lead Channel Setting Pacing Pulse Width: 0.8 ms
Lead Channel Setting Sensing Sensitivity: 0.3 mV

## 2020-02-10 NOTE — Telephone Encounter (Signed)
Patient called back about her device firing and she would life is someone give her a call asap.Patient states that she is feeling fine, just concerned

## 2020-02-10 NOTE — Telephone Encounter (Signed)
Patient called back and she has sent a transmission in carelink. Pt aware she did not receive a shock and a nurse will call her back to explain

## 2020-02-10 NOTE — Telephone Encounter (Signed)
  1. Has your device fired? YES - approximately 15 minutes ago   2. Is you device beeping? no  3. Are you experiencing draining or swelling at device site? No   4. Are you calling to see if we received your device transmission?   5. Have you passed out? No -feels dizzy   Please call [phone]  Please route to Device Clinic Pool

## 2020-02-10 NOTE — Telephone Encounter (Signed)
Called patient back to get a transmission and she says she is out getting her nails done and will call once she get home to send that transmission for [place]

## 2020-02-10 NOTE — Telephone Encounter (Signed)
Spoke with pt regarding Medtronic BiV ICD at [facility] as of today, 02/10/20.  Explained alert tone will sound daily at the same time.  Reassured pt that as long as alert tone does not sound more than once a day, daily alerts will all be related to battery. Pt accepted virtual visit with Dr. Johney Frame on 02/14/20 at 11:45am to discuss gen change.  Aware to have BP and HR ready.  Pt denies additional questions at this time.  Transmission added to schedule for processing in advance of upcoming virtual visit.

## 2020-02-11 ENCOUNTER — Telehealth: Payer: Self-pay

## 2020-02-11 NOTE — Telephone Encounter (Signed)
Patient called to inform me the device alerted and her battery has reached the replacement time.  She spoke with Irving Burton in device clinic yesterday and just wanted confirmation of the information she received.  She stated she is a person that worries and just wanted to confirm there is nothing else she needs to do.  Reiterated the information given by Shirlee More, device clinic RN and reminded her that she will have a virtual visit to discuss with Dr Johney Frame on 9/20 at 11:45 AM.  She appreciated the information review.

## 2020-02-14 ENCOUNTER — Telehealth: Payer: Self-pay | Admitting: *Deleted

## 2020-02-14 ENCOUNTER — Other Ambulatory Visit: Payer: Self-pay

## 2020-02-14 ENCOUNTER — Telehealth (INDEPENDENT_AMBULATORY_CARE_PROVIDER_SITE_OTHER): Payer: Medicare HMO | Admitting: Internal Medicine

## 2020-02-14 ENCOUNTER — Encounter: Payer: Self-pay | Admitting: *Deleted

## 2020-02-14 DIAGNOSIS — I472 Ventricular tachycardia, unspecified: Secondary | ICD-10-CM

## 2020-02-14 DIAGNOSIS — I2510 Atherosclerotic heart disease of native coronary artery without angina pectoris: Secondary | ICD-10-CM

## 2020-02-14 DIAGNOSIS — I255 Ischemic cardiomyopathy: Secondary | ICD-10-CM | POA: Diagnosis not present

## 2020-02-14 NOTE — Addendum Note (Signed)
Addended by: Elease Etienne A on: 02/14/2020 10:30 AM   Modules accepted: Level of Service

## 2020-02-14 NOTE — Telephone Encounter (Signed)
-----   Message from Hillis Range, MD sent at 02/14/2020 11:58 AM EDT ----- Inetta Fermo, please schedule for ICD generator change (Medtronic) I could do this Friday 9/24 or a later date.   She does not like the patient alert tone every day.  Irving Burton, could you work with Inetta Fermo to think about turning the patient alert off depending on timing of the generator change?

## 2020-02-14 NOTE — Progress Notes (Signed)
Electrophysiology TeleHealth Note  Due to national recommendations of social distancing due to COVID 19, an audio telehealth visit is felt to be most appropriate for this patient at this time.  Verbal consent was obtained by me for the telehealth visit today.  The patient does not have capability for a virtual visit.  A phone visit is therefore required today.   Date:  02/14/2020   ID:  Maryann Conners, DOB 01/27/1946, MRN [id number]  Location: patient's home  Provider location:  [place]  Evaluation Performed: Follow-up visit  PCP:  Ignatius Specking, MD   Electrophysiologist:  Dr Johney Frame  Chief Complaint:  palpitations  History of Present Illness:    CIARRAH RAE is a 74 y.o. female who presents via telehealth conferencing today.  Since last being seen in our clinic, the patient reports doing very well.  Today, she denies symptoms of palpitations, chest pain, shortness of breath,  lower extremity edema, dizziness, presyncope, or syncope.  The patient is otherwise without complaint today.     Past Medical History:  Diagnosis Date  . Chronic systolic congestive heart failure (HCC)   . Coronary artery disease    anterior MI in 2010. LAD stent placement 2010. EF: 15%  . Hypertension   . Ischemic cardiomyopathy   . LBBB (left bundle branch block)   . MR (mitral regurgitation)    moderate to severe  . Tobacco abuse   . VF (ventricular fibrillation) (HCC) 2013   nonsustained VF, spontaneously termianted    Past Surgical History:  Procedure Laterality Date  . BI-VENTRICULAR IMPLANTABLE CARDIOVERTER DEFIBRILLATOR UPGRADE  11-16-2013   upgrade of previously implanted dual chamber ICD to MDT Ovidio Kin XT CRTD by Dr Johney Frame  . BI-VENTRICULAR IMPLANTABLE CARDIOVERTER DEFIBRILLATOR UPGRADE N/A 11/16/2013   Procedure: BI-VENTRICULAR IMPLANTABLE CARDIOVERTER DEFIBRILLATOR UPGRADE;  Surgeon: Gardiner Rhyme, MD;  Location: [facility] CATH LAB;  Service: Cardiovascular;  Laterality: N/A;  .  CARDIAC CATHETERIZATION  2010   LAD PCI and stent placement  . CARDIAC DEFIBRILLATOR PLACEMENT  06/14/09   MDT [facility] Dr Johney Frame  . TONSILLECTOMY AND ADENOIDECTOMY    . TUBAL LIGATION      Current Outpatient Medications  Medication Sig Dispense Refill  . acetaminophen (TYLENOL) 500 MG tablet Take 500 mg by mouth as needed.     Marland Kitchen aspirin 81 MG tablet Take 81 mg by mouth daily.      . carvedilol (COREG) 3.125 MG tablet TAKE 1 TABLET BY MOUTH 2 (TWO) TIMES DAILY. 180 tablet 1  . cetirizine (ZYRTEC) 10 MG tablet Take 10 mg by mouth daily.    . furosemide (LASIX) 40 MG tablet TAKE 1 TABLET (40 MG) BY MOUTH DAILY. 90 tablet 1  . nitroGLYCERIN (NITROSTAT) 0.4 MG SL tablet Place 1 tablet (0.4 mg total) under the tongue every 5 (five) minutes x 3 doses as needed for chest pain (if no relief afterwards, proceed to the ED for an evaluation or call 911). 75 tablet 1  . rosuvastatin (CRESTOR) 20 MG tablet Take 1 tablet (20 mg total) by mouth daily. 90 tablet 1  . sacubitril-valsartan (ENTRESTO) 24-26 MG Take 1 tablet by mouth 2 (two) times daily. 180 tablet 1  . spironolactone (ALDACTONE) 25 MG tablet TAKE 1 TABLET BY MOUTH  DAILY 90 tablet 1   No current facility-administered medications for this visit.    Allergies:   Penicillins, Sulfa antibiotics, and Tetracyclines & related   Social History:  The patient  reports that  she quit smoking about 3 years ago. Her smoking use included cigarettes. She started smoking about 53 years ago. She has a 9.00 pack-year smoking history. She has never used smokeless tobacco. She reports previous alcohol use. She reports that she does not use drugs.   ROS:  Please see the history of present illness.   All other systems are personally reviewed and negative.    Exam:    Vital Signs:  There were no vitals taken for this visit.  Well sounding, alert and conversant   Labs/Other Tests and Data Reviewed:    Recent Labs: No results found for requested labs within  last 8760 hours.   Wt Readings from Last 3 Encounters:  01/25/20 123 lb 3.2 oz (55.9 kg)  10/20/19 123 lb 11.2 oz (56.1 kg)  12/10/18 124 lb (56.2 kg)     Last device remote is reviewed from PaceART PDF which reveals normal device function, no arrhythmias,  She is at RRT.   ASSESSMENT & PLAN:    1.  Ischemic CM/ h/o VF/ chronic systolic dysfunction She has had prior VF which spontaneously terminated. EF has not recovered with CRT, though she has done reasonably well clinically. She has reached ERI. Risks, benefits, and alternatives to BiV ICD pulse generator replacement were discussed in detail today.  The patient understands that risks include but are not limited to bleeding, infection, pneumothorax, perforation, tamponade, vascular damage, renal failure, MI, stroke, death, inappropriate shocks, damage to his existing leads, and lead dislodgement and wishes to proceed.  We will therefore schedule the procedure at the next available time.  We will offer device clinic visit to turn off patient alert tone in the interim.  2. HTN Stable No change required today   Patient Risk:  after full review of this patients clinical status, I feel that they are at moderate risk at this time.  Today, I have spent 15 minutes with the patient with telehealth technology discussing arrhythmia management .    [name], MD  02/14/2020 11:53 AM     [address] [facility] [address] [place] [address] [phone] (office) [phone] (fax)

## 2020-02-14 NOTE — Progress Notes (Signed)
Remote ICD transmission.   

## 2020-02-14 NOTE — Telephone Encounter (Signed)
Spoke with pt to advise that we can see her in DC this Tuesday or Thursday to turn off auditory alert for ERI.  Reassured pt that daily alert is due to ERI status.  Pt wishes to think about it prior to scheduling a DC appointment.  Gen change is currently scheduled for 02/24/20.  She will plan to call back tomorrow to update [place].

## 2020-02-14 NOTE — Telephone Encounter (Signed)
Gen change scheduled, labs and covid screening.

## 2020-02-14 NOTE — H&P (View-Only) (Signed)
   Electrophysiology TeleHealth Note  Due to national recommendations of social distancing due to COVID 19, an audio telehealth visit is felt to be most appropriate for this patient at this time.  Verbal consent was obtained by me for the telehealth visit today.  The patient does not have capability for a virtual visit.  A phone visit is therefore required today.   Date:  02/14/2020   ID:  Tracy Mills, DOB 11/20/1945, MRN [id number]  Location: patient's home  Provider location:  [place]  Evaluation Performed: Follow-up visit  PCP:  Vyas, Dhruv B, MD   Electrophysiologist:  Dr Zuriyah Shatz  Chief Complaint:  palpitations  History of Present Illness:    Tracy Mills is a 74 y.o. female who presents via telehealth conferencing today.  Since last being seen in our clinic, the patient reports doing very well.  Today, she denies symptoms of palpitations, chest pain, shortness of breath,  lower extremity edema, dizziness, presyncope, or syncope.  The patient is otherwise without complaint today.     Past Medical History:  Diagnosis Date  . Chronic systolic congestive heart failure (HCC)   . Coronary artery disease    anterior MI in 2010. LAD stent placement 2010. EF: 15%  . Hypertension   . Ischemic cardiomyopathy   . LBBB (left bundle branch block)   . MR (mitral regurgitation)    moderate to severe  . Tobacco abuse   . VF (ventricular fibrillation) (HCC) 2013   nonsustained VF, spontaneously termianted    Past Surgical History:  Procedure Laterality Date  . BI-VENTRICULAR IMPLANTABLE CARDIOVERTER DEFIBRILLATOR UPGRADE  11-16-2013   upgrade of previously implanted dual chamber ICD to MDT Viva Quad XT CRTD by Dr Chaia Ikard  . BI-VENTRICULAR IMPLANTABLE CARDIOVERTER DEFIBRILLATOR UPGRADE N/A 11/16/2013   Procedure: BI-VENTRICULAR IMPLANTABLE CARDIOVERTER DEFIBRILLATOR UPGRADE;  Surgeon: Daliana Leverett D Maleta Pacha, MD;  Location: [facility] CATH LAB;  Service: Cardiovascular;  Laterality: N/A;  .  CARDIAC CATHETERIZATION  2010   LAD PCI and stent placement  . CARDIAC DEFIBRILLATOR PLACEMENT  06/14/09   MDT [facility] Dr Darriel Sinquefield  . TONSILLECTOMY AND ADENOIDECTOMY    . TUBAL LIGATION      Current Outpatient Medications  Medication Sig Dispense Refill  . acetaminophen (TYLENOL) 500 MG tablet Take 500 mg by mouth as needed.     . aspirin 81 MG tablet Take 81 mg by mouth daily.      . carvedilol (COREG) 3.125 MG tablet TAKE 1 TABLET BY MOUTH 2 (TWO) TIMES DAILY. 180 tablet 1  . cetirizine (ZYRTEC) 10 MG tablet Take 10 mg by mouth daily.    . furosemide (LASIX) 40 MG tablet TAKE 1 TABLET (40 MG) BY MOUTH DAILY. 90 tablet 1  . nitroGLYCERIN (NITROSTAT) 0.4 MG SL tablet Place 1 tablet (0.4 mg total) under the tongue every 5 (five) minutes x 3 doses as needed for chest pain (if no relief afterwards, proceed to the ED for an evaluation or call 911). 75 tablet 1  . rosuvastatin (CRESTOR) 20 MG tablet Take 1 tablet (20 mg total) by mouth daily. 90 tablet 1  . sacubitril-valsartan (ENTRESTO) 24-26 MG Take 1 tablet by mouth 2 (two) times daily. 180 tablet 1  . spironolactone (ALDACTONE) 25 MG tablet TAKE 1 TABLET BY MOUTH  DAILY 90 tablet 1   No current facility-administered medications for this visit.    Allergies:   Penicillins, Sulfa antibiotics, and Tetracyclines & related   Social History:  The patient  reports that   she quit smoking about 3 years ago. Her smoking use included cigarettes. She started smoking about 53 years ago. She has a 9.00 pack-year smoking history. She has never used smokeless tobacco. She reports previous alcohol use. She reports that she does not use drugs.   ROS:  Please see the history of present illness.   All other systems are personally reviewed and negative.    Exam:    Vital Signs:  There were no vitals taken for this visit.  Well sounding, alert and conversant   Labs/Other Tests and Data Reviewed:    Recent Labs: No results found for requested labs within  last 8760 hours.   Wt Readings from Last 3 Encounters:  01/25/20 123 lb 3.2 oz (55.9 kg)  10/20/19 123 lb 11.2 oz (56.1 kg)  12/10/18 124 lb (56.2 kg)     Last device remote is reviewed from PaceART PDF which reveals normal device function, no arrhythmias,  She is at RRT.   ASSESSMENT & PLAN:    1.  Ischemic CM/ h/o VF/ chronic systolic dysfunction She has had prior VF which spontaneously terminated. EF has not recovered with CRT, though she has done reasonably well clinically. She has reached ERI. Risks, benefits, and alternatives to BiV ICD pulse generator replacement were discussed in detail today.  The patient understands that risks include but are not limited to bleeding, infection, pneumothorax, perforation, tamponade, vascular damage, renal failure, MI, stroke, death, inappropriate shocks, damage to his existing leads, and lead dislodgement and wishes to proceed.  We will therefore schedule the procedure at the next available time.  We will offer device clinic visit to turn off patient alert tone in the interim.  2. HTN Stable No change required today   Patient Risk:  after full review of this patients clinical status, I feel that they are at moderate risk at this time.  Today, I have spent 15 minutes with the patient with telehealth technology discussing arrhythmia management .    [name], MD  02/14/2020 11:53 AM     [address] [facility] [address] [place] [address] [phone] (office) [phone] (fax)

## 2020-02-15 NOTE — Telephone Encounter (Signed)
Patient calling back.   °

## 2020-02-15 NOTE — Telephone Encounter (Signed)
Patient states she is will to have gen change, she does not think she needs to come in to beeping noise. Patient advised to call device clinic with any further questions or concerns.

## 2020-02-21 ENCOUNTER — Other Ambulatory Visit (HOSPITAL_COMMUNITY)
Admission: RE | Admit: 2020-02-21 | Discharge: 2020-02-21 | Disposition: A | Discharge status: Home / Self Care | Payer: Medicare HMO | Source: Ambulatory Visit | Attending: Internal Medicine | Admitting: Internal Medicine

## 2020-02-21 ENCOUNTER — Other Ambulatory Visit: Payer: Medicare HMO | Admitting: *Deleted

## 2020-02-21 ENCOUNTER — Other Ambulatory Visit: Payer: Self-pay

## 2020-02-21 DIAGNOSIS — Z01812 Encounter for preprocedural laboratory examination: Secondary | ICD-10-CM | POA: Diagnosis not present

## 2020-02-21 DIAGNOSIS — I472 Ventricular tachycardia, unspecified: Secondary | ICD-10-CM

## 2020-02-21 DIAGNOSIS — Z20822 Contact with and (suspected) exposure to covid-19: Secondary | ICD-10-CM | POA: Insufficient documentation

## 2020-02-21 LAB — CBC WITH DIFFERENTIAL/PLATELET
Basophils Absolute: 0.1 10*3/uL (ref 0.0–0.2)
Basos: 1 %
EOS (ABSOLUTE): 0.5 10*3/uL — ABNORMAL HIGH (ref 0.0–0.4)
Eos: 10 %
Hematocrit: 37.7 % (ref 34.0–46.6)
Hemoglobin: 13 g/dL (ref 11.1–15.9)
Lymphocytes Absolute: 1.8 10*3/uL (ref 0.7–3.1)
Lymphs: 34 %
MCH: 31.6 pg (ref 26.6–33.0)
MCHC: 34.5 g/dL (ref 31.5–35.7)
MCV: 92 fL (ref 79–97)
Monocytes Absolute: 0.7 10*3/uL (ref 0.1–0.9)
Monocytes: 13 %
Neutrophils Absolute: 2.3 10*3/uL (ref 1.4–7.0)
Neutrophils: 42 %
Platelets: 325 10*3/uL (ref 150–450)
RBC: 4.11 x10E6/uL (ref 3.77–5.28)
RDW: 15.1 % (ref 11.7–15.4)
WBC: 5.4 10*3/uL (ref 3.4–10.8)

## 2020-02-21 LAB — BASIC METABOLIC PANEL
BUN/Creatinine Ratio: 18 (ref 12–28)
BUN: 22 mg/dL (ref 8–27)
CO2: 27 mmol/L (ref 20–29)
Calcium: 10.5 mg/dL — ABNORMAL HIGH (ref 8.7–10.3)
Chloride: 101 mmol/L (ref 96–106)
Creatinine, Ser: 1.23 mg/dL — ABNORMAL HIGH (ref 0.57–1.00)
GFR calc Af Amer: 50 mL/min/{1.73_m2} — ABNORMAL LOW (ref >59)
GFR calc non Af Amer: 44 mL/min/{1.73_m2} — ABNORMAL LOW (ref >59)
Glucose: 71 mg/dL (ref 65–99)
Potassium: 4.2 mmol/L (ref 3.5–5.2)
Sodium: 138 mmol/L (ref 134–144)

## 2020-02-22 LAB — SARS CORONAVIRUS 2 (TAT 6-24 HRS): SARS Coronavirus 2: NEGATIVE

## 2020-02-23 NOTE — Progress Notes (Signed)
Instructed patient on the following items: Arrival time 1430 Nothing to eat or drink after midnight No meds AM of procedure Responsible person to drive you home and stay with you for 24 hrs Wash with special soap night before and morning of procedure  

## 2020-02-24 ENCOUNTER — Other Ambulatory Visit: Payer: Self-pay

## 2020-02-24 ENCOUNTER — Ambulatory Visit (HOSPITAL_COMMUNITY)
Admission: RE | Admit: 2020-02-24 | Discharge: 2020-02-24 | Disposition: A | Discharge status: Home / Self Care | Payer: Medicare HMO | Attending: Internal Medicine | Admitting: Internal Medicine

## 2020-02-24 ENCOUNTER — Encounter (HOSPITAL_COMMUNITY)
Admission: RE | Disposition: A | Discharge status: Home / Self Care | Payer: Self-pay | Source: Home / Self Care | Attending: Internal Medicine

## 2020-02-24 DIAGNOSIS — Z955 Presence of coronary angioplasty implant and graft: Secondary | ICD-10-CM | POA: Insufficient documentation

## 2020-02-24 DIAGNOSIS — Z881 Allergy status to other antibiotic agents status: Secondary | ICD-10-CM | POA: Insufficient documentation

## 2020-02-24 DIAGNOSIS — I255 Ischemic cardiomyopathy: Secondary | ICD-10-CM | POA: Diagnosis not present

## 2020-02-24 DIAGNOSIS — Z7982 Long term (current) use of aspirin: Secondary | ICD-10-CM | POA: Insufficient documentation

## 2020-02-24 DIAGNOSIS — Z4502 Encounter for adjustment and management of automatic implantable cardiac defibrillator: Secondary | ICD-10-CM | POA: Diagnosis not present

## 2020-02-24 DIAGNOSIS — I110 Hypertensive heart disease with heart failure: Secondary | ICD-10-CM | POA: Diagnosis not present

## 2020-02-24 DIAGNOSIS — I252 Old myocardial infarction: Secondary | ICD-10-CM | POA: Diagnosis not present

## 2020-02-24 DIAGNOSIS — I5022 Chronic systolic (congestive) heart failure: Secondary | ICD-10-CM | POA: Diagnosis not present

## 2020-02-24 DIAGNOSIS — I2510 Atherosclerotic heart disease of native coronary artery without angina pectoris: Secondary | ICD-10-CM | POA: Diagnosis not present

## 2020-02-24 DIAGNOSIS — Z882 Allergy status to sulfonamides status: Secondary | ICD-10-CM | POA: Insufficient documentation

## 2020-02-24 DIAGNOSIS — Z79899 Other long term (current) drug therapy: Secondary | ICD-10-CM | POA: Diagnosis not present

## 2020-02-24 DIAGNOSIS — Z006 Encounter for examination for normal comparison and control in clinical research program: Secondary | ICD-10-CM | POA: Diagnosis not present

## 2020-02-24 DIAGNOSIS — Z880 Allergy status to penicillin: Secondary | ICD-10-CM | POA: Diagnosis not present

## 2020-02-24 DIAGNOSIS — I447 Left bundle-branch block, unspecified: Secondary | ICD-10-CM | POA: Insufficient documentation

## 2020-02-24 DIAGNOSIS — Z87891 Personal history of nicotine dependence: Secondary | ICD-10-CM | POA: Diagnosis not present

## 2020-02-24 HISTORY — PX: BIV ICD GENERATOR CHANGEOUT: EP1194

## 2020-02-24 SURGERY — BIV ICD GENERATOR CHANGEOUT

## 2020-02-24 MED ORDER — SODIUM CHLORIDE 0.9 % IV SOLN
80.0000 mg | INTRAVENOUS | Status: AC
  Administered 2020-02-24: 80 mg
  Filled 2020-02-24: qty 2

## 2020-02-24 MED ORDER — SODIUM CHLORIDE 0.9 % IV SOLN: INTRAVENOUS | Status: DC

## 2020-02-24 MED ORDER — ONDANSETRON HCL 4 MG/2ML IJ SOLN: 4.0000 mg | Freq: Four times a day (QID) | INTRAMUSCULAR | Status: DC | PRN

## 2020-02-24 MED ORDER — FENTANYL CITRATE (PF) 100 MCG/2ML IJ SOLN
INTRAMUSCULAR | Status: AC
  Filled 2020-02-24: qty 2

## 2020-02-24 MED ORDER — MIDAZOLAM HCL 5 MG/5ML IJ SOLN
INTRAMUSCULAR | Status: AC
  Filled 2020-02-24: qty 5

## 2020-02-24 MED ORDER — MIDAZOLAM HCL 5 MG/5ML IJ SOLN
INTRAMUSCULAR | Status: DC | PRN
  Administered 2020-02-24 (×2): 1 mg via INTRAVENOUS

## 2020-02-24 MED ORDER — VANCOMYCIN HCL IN DEXTROSE 1-5 GM/200ML-% IV SOLN
INTRAVENOUS | Status: AC
  Filled 2020-02-24: qty 200

## 2020-02-24 MED ORDER — LIDOCAINE HCL (PF) 1 % IJ SOLN
INTRAMUSCULAR | Status: AC
  Filled 2020-02-24: qty 30

## 2020-02-24 MED ORDER — FENTANYL CITRATE (PF) 100 MCG/2ML IJ SOLN
INTRAMUSCULAR | Status: DC | PRN
Start: 2020-02-24 — End: 2020-02-24
  Administered 2020-02-24: 12.5 ug via INTRAVENOUS

## 2020-02-24 MED ORDER — VANCOMYCIN HCL IN DEXTROSE 1-5 GM/200ML-% IV SOLN
1000.0000 mg | INTRAVENOUS | Status: AC
  Administered 2020-02-24: 1000 mg via INTRAVENOUS
  Filled 2020-02-24: qty 200

## 2020-02-24 MED ORDER — SODIUM CHLORIDE 0.9 % IV SOLN: 250.0000 mL | INTRAVENOUS | Status: DC | PRN

## 2020-02-24 MED ORDER — LIDOCAINE HCL (PF) 1 % IJ SOLN
INTRAMUSCULAR | Status: DC | PRN
  Administered 2020-02-24: 60 mL

## 2020-02-24 MED ORDER — SODIUM CHLORIDE 0.9% FLUSH: 3.0000 mL | Freq: Two times a day (BID) | INTRAVENOUS | Status: DC

## 2020-02-24 MED ORDER — SODIUM CHLORIDE 0.9 % IV SOLN
INTRAVENOUS | Status: AC
  Filled 2020-02-24: qty 2

## 2020-02-24 MED ORDER — SODIUM CHLORIDE 0.9% FLUSH: 3.0000 mL | INTRAVENOUS | Status: DC | PRN

## 2020-02-24 MED ORDER — ACETAMINOPHEN 325 MG PO TABS
325.0000 mg | ORAL_TABLET | ORAL | Status: DC | PRN
  Filled 2020-02-24: qty 2

## 2020-02-24 SURGICAL SUPPLY — 5 items
CABLE SURGICAL S-101-97-12 (CABLE) ×3 IMPLANT
ICD CLARIA MRI DTMA1Q1 (ICD Generator) ×3 IMPLANT
PAD PRO RADIOLUCENT 2001M-C (PAD) ×3 IMPLANT
POUCH AIGIS-R ANTIBACT ICD (Mesh General) ×3 IMPLANT
TRAY PACEMAKER INSERTION (PACKS) ×3 IMPLANT

## 2020-02-24 NOTE — Discharge Instructions (Signed)

## 2020-02-24 NOTE — Interval H&P Note (Signed)
History and Physical Interval Note:  02/24/2020 3:24 PM  Tracy Mills  has presented today for surgery, with the diagnosis of ERI.  The various methods of treatment have been discussed with the patient and family. After consideration of risks, benefits and other options for treatment, the patient has consented to  Procedure(s): BIV ICD GENERATOR CHANGEOUT (N/A) as a surgical intervention.  The patient's history has been reviewed, patient examined, no change in status, stable for surgery.  I have reviewed the patient's chart and labs.  Questions were answered to the patient's satisfaction.    ICD Criteria  Current LVEF:25%. Within 12 months prior to implant: Yes   Heart failure history: Yes, Class III  Cardiomyopathy history: Yes, Ischemic Cardiomyopathy - Prior MI.  Atrial Fibrillation/Atrial Flutter: No.  Ventricular tachycardia history: nonsustained ventricular fibrillation observed on ICD  Cardiac arrest history: No.  History of syndromes with risk of sudden death: No.  Previous ICD: Yes, Reason for ICD:  Primary prevention.  Current ICD indication: Primary  PPM indication:  yes CRT   Class I or II Bradycardia indication present: Yes  Beta Blocker therapy for 3 or more months: Yes, prescribed.   Ace Inhibitor/ARB therapy for 3 or more months: Yes, prescribed.    I have seen Tracy Mills is a 74 y.o. female who presents today for bIV ICD generator change. The patient's chart has been reviewed and they meet criteria for BIV ICD generator change.  I have had a thorough discussion with the patient reviewing options.  The patient has had opportunities to ask questions and have them answered. The patient and I have decided together through the [facility] Share Decision Support Tool to proceed with BiVICD at this time.  Risks, benefits, alternatives to BIV ICD generator change were discussed in detail with the patient today. The patient  understands that the risks include  but are not limited to bleeding, infection, pneumothorax, perforation, tamponade, vascular damage, renal failure, MI, stroke, death, inappropriate shocks, and lead dislodgement and wishes to proceed.      Hillis Range

## 2020-02-25 ENCOUNTER — Encounter (HOSPITAL_COMMUNITY): Payer: Self-pay | Admitting: Internal Medicine

## 2020-02-28 ENCOUNTER — Telehealth: Payer: Self-pay | Admitting: Internal Medicine

## 2020-02-28 NOTE — Progress Notes (Signed)
ICM remote transmission rescheduled for 04/03/2020 to allow development of Optivol baseline.

## 2020-02-28 NOTE — Telephone Encounter (Signed)
° ° ° ° °  1. Has your device fired?   2. Is you device beeping?   3. Are you experiencing draining or swelling at device site?   4. Are you calling to see if we received your device transmission?   5. Have you passed out?   Pt said she is having trouble with her monitor. She said she called [employer] and she was advice to call device clinic. She supposed to send transmission today    Please route to Device Clinic Pool

## 2020-02-28 NOTE — Telephone Encounter (Signed)
Received call from patient.  She reports she needs the updated information from the replacement device so she can send a transmission.  Advised will send to device clinic to enter new device information from her replacement that occurred 02/24/2020 and they will give her a call back so she can get the monitor and device paired for transmissions.    Device clinic please call patient once information updated in [employer].

## 2020-02-28 NOTE — Telephone Encounter (Signed)
Patient calling back in stating she got a new pacemake on 9/30. Patient states she hasn't been able to talk with the device clinic to update her information for [employer]. Please call patient for any further information

## 2020-02-28 NOTE — Telephone Encounter (Signed)
The pt device information was already updated in [employer]. It just needed to be accepted by clicking on Review. I did that and the pt has sent an initial transmission.

## 2020-03-14 ENCOUNTER — Ambulatory Visit (INDEPENDENT_AMBULATORY_CARE_PROVIDER_SITE_OTHER): Payer: Medicare HMO | Admitting: Emergency Medicine

## 2020-03-14 ENCOUNTER — Other Ambulatory Visit: Payer: Self-pay

## 2020-03-14 DIAGNOSIS — I472 Ventricular tachycardia, unspecified: Secondary | ICD-10-CM

## 2020-03-14 DIAGNOSIS — I255 Ischemic cardiomyopathy: Secondary | ICD-10-CM

## 2020-03-14 LAB — CUP PACEART INCLINIC DEVICE CHECK
Battery Remaining Longevity: 97 mo
Battery Voltage: 3.14 V
Brady Statistic AP VP Percent: 72.41 %
Brady Statistic AP VS Percent: 1.32 %
Brady Statistic AS VP Percent: 25.86 %
Brady Statistic AS VS Percent: 0.42 %
Brady Statistic RA Percent Paced: 73.41 %
Brady Statistic RV Percent Paced: 19.73 %
Date Time Interrogation Session: 20211019102506
HighPow Impedance: 45 Ohm
HighPow Impedance: 58 Ohm
Implantable Lead Implant Date: 20110119
Implantable Lead Implant Date: 20110119
Implantable Lead Implant Date: 20150623
Implantable Lead Location: 753858
Implantable Lead Location: 753859
Implantable Lead Location: 753860
Implantable Lead Model: 4598
Implantable Lead Model: 5076
Implantable Lead Model: 6947
Implantable Pulse Generator Implant Date: 20210930
Lead Channel Impedance Value: 1007 Ohm
Lead Channel Impedance Value: 1235 Ohm
Lead Channel Impedance Value: 1292 Ohm
Lead Channel Impedance Value: 200.511
Lead Channel Impedance Value: 206.72 Ohm
Lead Channel Impedance Value: 218.667
Lead Channel Impedance Value: 304 Ohm
Lead Channel Impedance Value: 335.403
Lead Channel Impedance Value: 353.147
Lead Channel Impedance Value: 361 Ohm
Lead Channel Impedance Value: 456 Ohm
Lead Channel Impedance Value: 456 Ohm
Lead Channel Impedance Value: 589 Ohm
Lead Channel Impedance Value: 646 Ohm
Lead Channel Impedance Value: 779 Ohm
Lead Channel Impedance Value: 817 Ohm
Lead Channel Impedance Value: 836 Ohm
Lead Channel Impedance Value: 836 Ohm
Lead Channel Pacing Threshold Amplitude: 0.75 V
Lead Channel Pacing Threshold Amplitude: 1 V
Lead Channel Pacing Threshold Amplitude: 1.5 V
Lead Channel Pacing Threshold Pulse Width: 0.4 ms
Lead Channel Pacing Threshold Pulse Width: 0.4 ms
Lead Channel Pacing Threshold Pulse Width: 0.8 ms
Lead Channel Sensing Intrinsic Amplitude: 1.25 mV
Lead Channel Sensing Intrinsic Amplitude: 21.125 mV
Lead Channel Setting Pacing Amplitude: 2 V
Lead Channel Setting Pacing Amplitude: 2.5 V
Lead Channel Setting Pacing Amplitude: 2.5 V
Lead Channel Setting Pacing Pulse Width: 0.4 ms
Lead Channel Setting Pacing Pulse Width: 0.8 ms
Lead Channel Setting Sensing Sensitivity: 0.3 mV

## 2020-03-14 NOTE — Progress Notes (Signed)
CRT-D wound check device check in office. Thresholds and sensing consistent with previous device measurements. Lead impedance trends stable over time. No mode switch episodes recorded. No ventricular arrhythmia episodes recorded. Patient bi-ventricularly pacing 96.87% of the time. Device programmed with appropriate safety margins. Heart failure diagnostics reviewed and trends are stable for patient. Audible/vibratory alerts demonstrated for patient. No changes made this session. Estimated longevity 8 years, 1 month.  Patient enrolled in remote follow up and next remote 04/03/20. Follow -up with Dr Johney Frame in [place] 06/03/19 . Patient education completed including shock plan.

## 2020-03-17 ENCOUNTER — Telehealth: Payer: Self-pay

## 2020-03-17 NOTE — Telephone Encounter (Signed)
Spoke with pt.  She reports the skin surrounding her incision has developed little bumps that are itchy.  She has been applying neosporine and rubbing alcohol.  Advised to DC using both, wash site with soap and water only.  Provided email address for patient to send a picture.

## 2020-03-17 NOTE — Telephone Encounter (Signed)
Patient called in c/o wound site is irritated with bumps and it is raw. She states that she was told to use neosporin and that has made it worse

## 2020-03-22 NOTE — Telephone Encounter (Signed)
LMOM to call office, DC # and office hours provided. Need picture of site .

## 2020-03-22 NOTE — Telephone Encounter (Signed)
Patient reports that she did send picture. Requested she send photo again. She reports the "bumps" around the wound site have started to fade since she discontinued the use of neosporin and "rubbing alcohol' on the area. No redness, edema , draiinage reported at incision site. Area of concern reported in area where the steri-strips were located and not at incision site. Education done on s/sx of infection and instructed to call device clinic.

## 2020-03-27 ENCOUNTER — Telehealth: Payer: Self-pay

## 2020-03-27 NOTE — Telephone Encounter (Signed)
Advised okay to go ahead and get covid booster shot per MD Allred.

## 2020-03-27 NOTE — Telephone Encounter (Signed)
The pt wants to know if it is okay for her to get the covid booster shot or should she wait until she see Dr. Johney Frame on there next appointment coming up. Her phone number is [phone].

## 2020-03-28 DIAGNOSIS — Z7982 Long term (current) use of aspirin: Secondary | ICD-10-CM | POA: Diagnosis not present

## 2020-03-28 DIAGNOSIS — H547 Unspecified visual loss: Secondary | ICD-10-CM | POA: Diagnosis not present

## 2020-03-28 DIAGNOSIS — I509 Heart failure, unspecified: Secondary | ICD-10-CM | POA: Diagnosis not present

## 2020-03-28 DIAGNOSIS — I25119 Atherosclerotic heart disease of native coronary artery with unspecified angina pectoris: Secondary | ICD-10-CM | POA: Diagnosis not present

## 2020-03-28 DIAGNOSIS — M109 Gout, unspecified: Secondary | ICD-10-CM | POA: Diagnosis not present

## 2020-03-28 DIAGNOSIS — E261 Secondary hyperaldosteronism: Secondary | ICD-10-CM | POA: Diagnosis not present

## 2020-03-28 DIAGNOSIS — Z008 Encounter for other general examination: Secondary | ICD-10-CM | POA: Diagnosis not present

## 2020-03-28 DIAGNOSIS — E785 Hyperlipidemia, unspecified: Secondary | ICD-10-CM | POA: Diagnosis not present

## 2020-03-28 DIAGNOSIS — I110 Hypertensive heart disease with heart failure: Secondary | ICD-10-CM | POA: Diagnosis not present

## 2020-03-28 DIAGNOSIS — R32 Unspecified urinary incontinence: Secondary | ICD-10-CM | POA: Diagnosis not present

## 2020-03-28 DIAGNOSIS — I252 Old myocardial infarction: Secondary | ICD-10-CM | POA: Diagnosis not present

## 2020-03-28 DIAGNOSIS — I739 Peripheral vascular disease, unspecified: Secondary | ICD-10-CM | POA: Diagnosis not present

## 2020-03-28 DIAGNOSIS — Z818 Family history of other mental and behavioral disorders: Secondary | ICD-10-CM | POA: Diagnosis not present

## 2020-03-31 ENCOUNTER — Encounter: Payer: Medicare PPO | Admitting: Internal Medicine

## 2020-03-31 NOTE — Telephone Encounter (Signed)
No issues at this time.

## 2020-04-03 ENCOUNTER — Ambulatory Visit (INDEPENDENT_AMBULATORY_CARE_PROVIDER_SITE_OTHER): Payer: Medicare HMO

## 2020-04-03 DIAGNOSIS — Z95810 Presence of automatic (implantable) cardiac defibrillator: Secondary | ICD-10-CM

## 2020-04-03 DIAGNOSIS — I5042 Chronic combined systolic (congestive) and diastolic (congestive) heart failure: Secondary | ICD-10-CM | POA: Diagnosis not present

## 2020-04-05 NOTE — Progress Notes (Signed)
EPIC Encounter for ICM Monitoring  Patient Name: Tracy Mills is a 74 y.o. female Date: 04/05/2020 Primary Care Physican: Ignatius Specking, MD Primary Cardiologist:Koneswaran Electrophysiologist: Allred Bi-V Pacing: 97.3% 11/10/2021Weight:132lbs  Time in AT/AF  0.0 hr/day (0.0%)     Spoke with patient and reports feeling well at this time.  Denies fluid symptoms.    OptivolThoracic impedancenormal.  TakesFurosemide 40 mgtake1 tablet daily  Labs: 09/28/2019 Creatinine1.36, BUN18, Potassium4.5, Sodium139, [id number] A complete set of results can be found in Results Review.  Recommendations:No changes and encouraged to call if experiencing any fluid symptoms.    Follow-up plan: ICM clinic phone appointment on12/142021. 91 day device clinic remote transmission 04/13/2020.   EP/Cardiology Office Visits:06/02/2020 with Dr.Allred.07/28/2019 for with Diona Browner  Copy of ICM check sent to Dr.Allred.    3 month ICM trend: 04/03/2020    1 Year ICM trend:       [name], RN 04/05/2020 2:28 PM

## 2020-04-25 DIAGNOSIS — E782 Mixed hyperlipidemia: Secondary | ICD-10-CM | POA: Diagnosis not present

## 2020-04-25 DIAGNOSIS — M87059 Idiopathic aseptic necrosis of unspecified femur: Secondary | ICD-10-CM | POA: Diagnosis not present

## 2020-04-25 DIAGNOSIS — I2510 Atherosclerotic heart disease of native coronary artery without angina pectoris: Secondary | ICD-10-CM | POA: Diagnosis not present

## 2020-05-09 ENCOUNTER — Telehealth: Payer: Self-pay | Admitting: *Deleted

## 2020-05-09 DIAGNOSIS — I5022 Chronic systolic (congestive) heart failure: Secondary | ICD-10-CM

## 2020-05-09 MED ORDER — CARVEDILOL 3.125 MG PO TABS: ORAL_TABLET | ORAL | 1 refills | Status: DC

## 2020-05-09 MED ORDER — FUROSEMIDE 40 MG PO TABS: ORAL_TABLET | ORAL | 1 refills | Status: DC

## 2020-05-09 MED ORDER — SACUBITRIL-VALSARTAN 24-26 MG PO TABS: 1.0000 | ORAL_TABLET | Freq: Two times a day (BID) | ORAL | 1 refills | Status: DC

## 2020-05-09 NOTE — Telephone Encounter (Signed)
Refills sent

## 2020-05-10 DIAGNOSIS — Z299 Encounter for prophylactic measures, unspecified: Secondary | ICD-10-CM | POA: Diagnosis not present

## 2020-05-10 DIAGNOSIS — I25700 Atherosclerosis of coronary artery bypass graft(s), unspecified, with unstable angina pectoris: Secondary | ICD-10-CM | POA: Diagnosis not present

## 2020-05-10 DIAGNOSIS — I10 Essential (primary) hypertension: Secondary | ICD-10-CM | POA: Diagnosis not present

## 2020-05-10 DIAGNOSIS — I472 Ventricular tachycardia: Secondary | ICD-10-CM | POA: Diagnosis not present

## 2020-05-10 DIAGNOSIS — I509 Heart failure, unspecified: Secondary | ICD-10-CM | POA: Diagnosis not present

## 2020-05-16 ENCOUNTER — Ambulatory Visit (INDEPENDENT_AMBULATORY_CARE_PROVIDER_SITE_OTHER): Payer: Medicare HMO

## 2020-05-16 DIAGNOSIS — Z95810 Presence of automatic (implantable) cardiac defibrillator: Secondary | ICD-10-CM

## 2020-05-16 DIAGNOSIS — I5022 Chronic systolic (congestive) heart failure: Secondary | ICD-10-CM

## 2020-05-16 NOTE — Progress Notes (Signed)
EPIC Encounter for ICM Monitoring  Patient Name: Tracy Mills is a 74 y.o. female Date: 05/16/2020 Primary Care Physican: Tracy Specking, MD Primary Cardiologist:Tracy Mills Electrophysiologist: Tracy Mills Bi-V Pacing: 96.6% 11/10/2021Weight:132lbs  Time in AT/AF  0.0 hr/day (0.0%)     Transmission Reviewed.  OptivolThoracic impedancenormal.  TakesFurosemide 40 mgtake1 tablet daily  Labs: 09/28/2019 Creatinine1.36, BUN18, Potassium4.5, Sodium139, [id number] A complete set of results can be found in Results Review.  Recommendations:No changes.  Follow-up plan: ICM clinic phone appointment on2/05/2020. 91 day device clinic remote transmission 05/29/2020.   EP/Cardiology Office Visits:06/02/2020 with Dr.Allred.07/28/2019 for with Tracy Mills  Copy of ICM check sent to Dr.Allred.  3 month ICM trend: 05/11/2020    1 Year ICM trend:       Tracy Soda, RN 05/16/2020 3:56 PM

## 2020-05-29 ENCOUNTER — Ambulatory Visit (INDEPENDENT_AMBULATORY_CARE_PROVIDER_SITE_OTHER): Payer: Medicare HMO

## 2020-05-29 DIAGNOSIS — I255 Ischemic cardiomyopathy: Secondary | ICD-10-CM

## 2020-05-30 LAB — CUP PACEART REMOTE DEVICE CHECK
Battery Remaining Longevity: 102 mo
Battery Voltage: 3.09 V
Brady Statistic AP VP Percent: 58.94 %
Brady Statistic AP VS Percent: 1.12 %
Brady Statistic AS VP Percent: 39.29 %
Brady Statistic AS VS Percent: 0.65 %
Brady Statistic RA Percent Paced: 59.81 %
Brady Statistic RV Percent Paced: 9.66 %
Date Time Interrogation Session: 20220103001803
HighPow Impedance: 49 Ohm
HighPow Impedance: 61 Ohm
Implantable Lead Implant Date: 20110119
Implantable Lead Implant Date: 20110119
Implantable Lead Implant Date: 20150623
Implantable Lead Location: 753858
Implantable Lead Location: 753859
Implantable Lead Location: 753860
Implantable Lead Model: 4598
Implantable Lead Model: 5076
Implantable Lead Model: 6947
Implantable Pulse Generator Implant Date: 20210930
Lead Channel Impedance Value: 1026 Ohm
Lead Channel Impedance Value: 1026 Ohm
Lead Channel Impedance Value: 1178 Ohm
Lead Channel Impedance Value: 1235 Ohm
Lead Channel Impedance Value: 1254 Ohm
Lead Channel Impedance Value: 289.049
Lead Channel Impedance Value: 297.365
Lead Channel Impedance Value: 319.42 Ohm
Lead Channel Impedance Value: 337.778
Lead Channel Impedance Value: 349.189
Lead Channel Impedance Value: 361 Ohm
Lead Channel Impedance Value: 456 Ohm
Lead Channel Impedance Value: 475 Ohm
Lead Channel Impedance Value: 551 Ohm
Lead Channel Impedance Value: 608 Ohm
Lead Channel Impedance Value: 646 Ohm
Lead Channel Impedance Value: 760 Ohm
Lead Channel Impedance Value: 836 Ohm
Lead Channel Pacing Threshold Amplitude: 0.5 V
Lead Channel Pacing Threshold Amplitude: 1.125 V
Lead Channel Pacing Threshold Amplitude: 1.125 V
Lead Channel Pacing Threshold Pulse Width: 0.4 ms
Lead Channel Pacing Threshold Pulse Width: 0.4 ms
Lead Channel Pacing Threshold Pulse Width: 0.8 ms
Lead Channel Sensing Intrinsic Amplitude: 1.5 mV
Lead Channel Sensing Intrinsic Amplitude: 1.5 mV
Lead Channel Sensing Intrinsic Amplitude: 26.25 mV
Lead Channel Sensing Intrinsic Amplitude: 26.25 mV
Lead Channel Setting Pacing Amplitude: 2 V
Lead Channel Setting Pacing Amplitude: 2.25 V
Lead Channel Setting Pacing Amplitude: 2.5 V
Lead Channel Setting Pacing Pulse Width: 0.4 ms
Lead Channel Setting Pacing Pulse Width: 0.8 ms
Lead Channel Setting Sensing Sensitivity: 0.3 mV

## 2020-06-02 ENCOUNTER — Encounter: Payer: Self-pay | Admitting: Internal Medicine

## 2020-06-02 ENCOUNTER — Telehealth: Payer: Self-pay

## 2020-06-02 ENCOUNTER — Encounter: Payer: Medicare HMO | Admitting: Internal Medicine

## 2020-06-02 ENCOUNTER — Ambulatory Visit: Payer: Medicare HMO | Admitting: Internal Medicine

## 2020-06-02 VITALS — BP 112/70 | HR 84 | Ht 63.0 in | Wt 125.4 lb

## 2020-06-02 DIAGNOSIS — Z95810 Presence of automatic (implantable) cardiac defibrillator: Secondary | ICD-10-CM | POA: Diagnosis not present

## 2020-06-02 DIAGNOSIS — I10 Essential (primary) hypertension: Secondary | ICD-10-CM

## 2020-06-02 DIAGNOSIS — I472 Ventricular tachycardia, unspecified: Secondary | ICD-10-CM

## 2020-06-02 DIAGNOSIS — I428 Other cardiomyopathies: Secondary | ICD-10-CM

## 2020-06-02 DIAGNOSIS — I255 Ischemic cardiomyopathy: Secondary | ICD-10-CM

## 2020-06-02 DIAGNOSIS — I5022 Chronic systolic (congestive) heart failure: Secondary | ICD-10-CM | POA: Diagnosis not present

## 2020-06-02 NOTE — Patient Instructions (Addendum)
Medication Instructions:   Your physician recommends that you continue on your current medications as directed. Please refer to the Current Medication list given to you today.  Labwork:  None  Testing/Procedures:  None  Follow-Up:  Your physician recommends that you schedule a follow-up appointment in: 1 year.  Your physician recommends that you schedule a follow-up appointment in: with your new cardiologist-first available.  Any Other Special Instructions Will Be Listed Below (If Applicable).  If you need a refill on your cardiac medications before your next appointment, please call your pharmacy.

## 2020-06-02 NOTE — Telephone Encounter (Signed)
Returned patient call as requested by voice mail message regarding transmission sent.  Advised reviewed 05/31/20 remote transmission and it suggesting possible dryness on 1/4 & 1/5.  She stated she feels fine and no signs of dehydration.  Encouraged to maintain fluid intake at 64 oz daily to keep fluid levels balanced.  She has appt with Dr Johney Frame this morning and looking forward to the appointment.  Advised ICM next remote transmission scheduled for 06/27/2020.

## 2020-06-02 NOTE — Progress Notes (Signed)
PCP: Ignatius Specking, MD Primary Cardiologist: previously Dr Purvis Sheffield Primary EP: Dr Avie Arenas is a 75 y.o. female who presents today for routine electrophysiology followup.  Since her generator change, the patient reports doing very well.  Today, she denies symptoms of palpitations, chest pain, shortness of breath,  lower extremity edema, dizziness, presyncope, syncope, or ICD shocks.  The patient is otherwise without complaint today.   Past Medical History:  Diagnosis Date  . Chronic systolic congestive heart failure (HCC)   . Coronary artery disease    anterior MI in 2010. LAD stent placement 2010. EF: 15%  . Hypertension   . Ischemic cardiomyopathy   . LBBB (left bundle branch block)   . MR (mitral regurgitation)    moderate to severe  . Tobacco abuse   . VF (ventricular fibrillation) (HCC) 2013   nonsustained VF, spontaneously termianted   Past Surgical History:  Procedure Laterality Date  . BI-VENTRICULAR IMPLANTABLE CARDIOVERTER DEFIBRILLATOR UPGRADE  11-16-2013   upgrade of previously implanted dual chamber ICD to MDT Ovidio Kin XT CRTD by Dr Johney Frame  . BI-VENTRICULAR IMPLANTABLE CARDIOVERTER DEFIBRILLATOR UPGRADE N/A 11/16/2013   Procedure: BI-VENTRICULAR IMPLANTABLE CARDIOVERTER DEFIBRILLATOR UPGRADE;  Surgeon: Gardiner Rhyme, MD;  Location: [facility] CATH LAB;  Service: Cardiovascular;  Laterality: N/A;  . BIV ICD GENERATOR CHANGEOUT N/A 02/24/2020   Procedure: BIV ICD GENERATOR CHANGEOUT;  Surgeon: Hillis Range, MD;  Location: [facility] INVASIVE CV LAB;  Service: Cardiovascular;  Laterality: N/A;  . CARDIAC CATHETERIZATION  2010   LAD PCI and stent placement  . CARDIAC DEFIBRILLATOR PLACEMENT  06/14/09   MDT [place] Dr Johney Frame  . TONSILLECTOMY AND ADENOIDECTOMY    . TUBAL LIGATION      ROS- all systems are reviewed and negative except as per HPI above  Current Outpatient Medications  Medication Sig Dispense Refill  . acetaminophen (TYLENOL) 500 MG tablet Take 1,000 mg  by mouth every 6 (six) hours as needed for moderate pain.     Marland Kitchen aspirin 81 MG tablet Take 81 mg by mouth daily.    . carvedilol (COREG) 3.125 MG tablet TAKE 1 TABLET BY MOUTH 2 (TWO) TIMES DAILY. 180 tablet 1  . cetirizine (ZYRTEC) 10 MG tablet Take 10 mg by mouth daily as needed for allergies.     Marland Kitchen colchicine 0.6 MG tablet Take 0.6 mg by mouth daily as needed (gout).    . furosemide (LASIX) 40 MG tablet TAKE 1 TABLET (40 MG) BY MOUTH DAILY. 90 tablet 1  . nitroGLYCERIN (NITROSTAT) 0.4 MG SL tablet Place 1 tablet (0.4 mg total) under the tongue every 5 (five) minutes x 3 doses as needed for chest pain (if no relief afterwards, proceed to the ED for an evaluation or call 911). 75 tablet 1  . rosuvastatin (CRESTOR) 20 MG tablet Take 1 tablet (20 mg total) by mouth daily. 90 tablet 1  . sacubitril-valsartan (ENTRESTO) 24-26 MG Take 1 tablet by mouth 2 (two) times daily. 180 tablet 1  . spironolactone (ALDACTONE) 25 MG tablet TAKE 1 TABLET BY MOUTH  DAILY (Patient taking differently: Take 25 mg by mouth daily.) 90 tablet 1   No current facility-administered medications for this visit.    Physical Exam: Vitals:   06/02/20 1131  BP: 112/70  Pulse: 84  SpO2: 97%  Weight: 125 lb 6.4 oz (56.9 kg)  Height: 5\' 3"  (1.6 m)    GEN- The patient is well appearing, alert and oriented x 3 today.   Head-  normocephalic, atraumatic Eyes-  Sclera clear, conjunctiva pink Ears- hearing intact Oropharynx- clear Lungs- Clear to ausculation bilaterally, normal work of breathing Chest- ICD pocket is well healed Heart- Regular rate and rhythm, no murmurs, rubs or gallops, PMI not laterally displaced GI- soft, NT, ND, + BS Extremities- no clubbing, cyanosis, or edema  ICD interrogation- reviewed in detail today,  See PACEART report  ekg tracing ordered today is personally reviewed and shows AV paced  Wt Readings from Last 3 Encounters:  06/02/20 125 lb 6.4 oz (56.9 kg)  02/24/20 129 lb (58.5 kg)   01/25/20 123 lb 3.2 oz (55.9 kg)    Assessment and Plan:  1.  Chronic systolic dysfunction/ ischemic CM/ CAD/ prior VF euvolemic today No ischemic symptosm Stable on an appropriate medical regimen Normal BiV ICD function See Pace Art report She did have episodes of T wave oversensing.  Sensitivity adjusted from 0.3 to 0.45 today she is not device dependant today followed in ICM device clinic  2. HTn Stable No change required today   Risks, benefits and potential toxicities for medications prescribed and/or refilled reviewed with patient today.   Hillis Range MD, [facility] 06/02/2020 12:15 PM

## 2020-06-13 NOTE — Progress Notes (Signed)
Remote ICD transmission.   

## 2020-06-22 LAB — CUP PACEART INCLINIC DEVICE CHECK
Battery Remaining Longevity: 91 mo
Battery Voltage: 3.08 V
Brady Statistic AP VP Percent: 49.16 %
Brady Statistic AP VS Percent: 0.99 %
Brady Statistic AS VP Percent: 49.01 %
Brady Statistic AS VS Percent: 0.84 %
Brady Statistic RA Percent Paced: 50 %
Brady Statistic RV Percent Paced: 5.71 %
Date Time Interrogation Session: 20220107233000
HighPow Impedance: 47 Ohm
HighPow Impedance: 59 Ohm
Implantable Lead Implant Date: 20110119
Implantable Lead Implant Date: 20110119
Implantable Lead Implant Date: 20150623
Implantable Lead Location: 753858
Implantable Lead Location: 753859
Implantable Lead Location: 753860
Implantable Lead Model: 4598
Implantable Lead Model: 5076
Implantable Lead Model: 6947
Implantable Pulse Generator Implant Date: 20210930
Lead Channel Impedance Value: 1007 Ohm
Lead Channel Impedance Value: 1064 Ohm
Lead Channel Impedance Value: 1197 Ohm
Lead Channel Impedance Value: 1235 Ohm
Lead Channel Impedance Value: 1292 Ohm
Lead Channel Impedance Value: 278.237
Lead Channel Impedance Value: 285.934
Lead Channel Impedance Value: 309.309
Lead Channel Impedance Value: 341.479
Lead Channel Impedance Value: 353.147
Lead Channel Impedance Value: 399 Ohm
Lead Channel Impedance Value: 418 Ohm
Lead Channel Impedance Value: 475 Ohm
Lead Channel Impedance Value: 513 Ohm
Lead Channel Impedance Value: 608 Ohm
Lead Channel Impedance Value: 646 Ohm
Lead Channel Impedance Value: 779 Ohm
Lead Channel Impedance Value: 931 Ohm
Lead Channel Pacing Threshold Amplitude: 0.5 V
Lead Channel Pacing Threshold Amplitude: 0.75 V
Lead Channel Pacing Threshold Amplitude: 1.5 V
Lead Channel Pacing Threshold Pulse Width: 0.4 ms
Lead Channel Pacing Threshold Pulse Width: 0.4 ms
Lead Channel Pacing Threshold Pulse Width: 0.8 ms
Lead Channel Sensing Intrinsic Amplitude: 1.1 mV
Lead Channel Sensing Intrinsic Amplitude: 20 mV
Lead Channel Setting Pacing Amplitude: 2 V
Lead Channel Setting Pacing Amplitude: 2.5 V
Lead Channel Setting Pacing Amplitude: 2.75 V
Lead Channel Setting Pacing Pulse Width: 0.4 ms
Lead Channel Setting Pacing Pulse Width: 0.8 ms
Lead Channel Setting Sensing Sensitivity: 0.45 mV

## 2020-06-27 ENCOUNTER — Ambulatory Visit (INDEPENDENT_AMBULATORY_CARE_PROVIDER_SITE_OTHER): Payer: Medicare HMO

## 2020-06-27 DIAGNOSIS — I5022 Chronic systolic (congestive) heart failure: Secondary | ICD-10-CM | POA: Diagnosis not present

## 2020-06-27 DIAGNOSIS — Z95810 Presence of automatic (implantable) cardiac defibrillator: Secondary | ICD-10-CM | POA: Diagnosis not present

## 2020-06-30 ENCOUNTER — Telehealth: Payer: Self-pay

## 2020-06-30 NOTE — Progress Notes (Signed)
Spoke with patient and reports feeling well at this time.  Denies fluid symptoms.  Transmission reviewed.  No changes and encouraged to call if experiencing any fluid symptoms.   

## 2020-06-30 NOTE — Telephone Encounter (Signed)
Remote ICM transmission received.  Attempted call to patient regarding ICM remote transmission and no answer or voice mail option.  

## 2020-06-30 NOTE — Progress Notes (Signed)
EPIC Encounter for ICM Monitoring  Patient Name: Tracy Mills is a 75 y.o. female Date: 06/30/2020 Primary Care Physican: Ignatius Specking, MD Primary Cardiologist:Koneswaran Electrophysiologist: Allred Bi-V Pacing: 97.6% 11/10/2021Weight:132lbs  Time in AT/AF0.0 hr/day (0.0%)     Attempted call to patient and unable to reach.   Transmission reviewed.   OptivolThoracic impedancenormal.  TakesFurosemide 40 mgtake1 tablet daily  Labs: 09/28/2019 Creatinine1.36, BUN18, Potassium4.5, Sodium139, [id number] A complete set of results can be found in Results Review.  Recommendations: Unable to reach.    Follow-up plan: ICM clinic phone appointment on3/11/2020. 91 day device clinic remote transmission 08/28/2020.   EP/Cardiology Office Visits:.07/28/2019 for with Diona Browner  Copy of ICM check sent to Dr.Allred.  3 month ICM trend: 06/22/2020.    1 Year ICM trend:       [name], RN 06/30/2020 12:53 PM

## 2020-07-06 ENCOUNTER — Other Ambulatory Visit: Payer: Self-pay | Admitting: *Deleted

## 2020-07-06 MED ORDER — SPIRONOLACTONE 25 MG PO TABS: ORAL_TABLET | ORAL | 3 refills | Status: DC

## 2020-07-06 MED ORDER — CARVEDILOL 3.125 MG PO TABS: ORAL_TABLET | ORAL | 3 refills | Status: DC

## 2020-07-07 ENCOUNTER — Other Ambulatory Visit: Payer: Self-pay | Admitting: *Deleted

## 2020-07-07 DIAGNOSIS — I5022 Chronic systolic (congestive) heart failure: Secondary | ICD-10-CM

## 2020-07-07 DIAGNOSIS — E785 Hyperlipidemia, unspecified: Secondary | ICD-10-CM

## 2020-07-07 MED ORDER — ROSUVASTATIN CALCIUM 20 MG PO TABS: 20.0000 mg | ORAL_TABLET | Freq: Every day | ORAL | 3 refills | Status: DC

## 2020-07-07 MED ORDER — FUROSEMIDE 40 MG PO TABS: ORAL_TABLET | ORAL | 3 refills | Status: DC

## 2020-07-07 MED ORDER — SACUBITRIL-VALSARTAN 24-26 MG PO TABS: 1.0000 | ORAL_TABLET | Freq: Two times a day (BID) | ORAL | 3 refills | Status: DC

## 2020-07-27 ENCOUNTER — Other Ambulatory Visit: Payer: Self-pay | Admitting: Internal Medicine

## 2020-07-27 ENCOUNTER — Encounter: Payer: Self-pay | Admitting: *Deleted

## 2020-07-27 ENCOUNTER — Ambulatory Visit (INDEPENDENT_AMBULATORY_CARE_PROVIDER_SITE_OTHER): Payer: Medicare HMO | Admitting: Cardiology

## 2020-07-27 ENCOUNTER — Encounter: Payer: Self-pay | Admitting: Cardiology

## 2020-07-27 VITALS — BP 100/52 | HR 54 | Ht 63.0 in | Wt 122.0 lb

## 2020-07-27 DIAGNOSIS — Z95810 Presence of automatic (implantable) cardiac defibrillator: Secondary | ICD-10-CM

## 2020-07-27 DIAGNOSIS — I25119 Atherosclerotic heart disease of native coronary artery with unspecified angina pectoris: Secondary | ICD-10-CM

## 2020-07-27 DIAGNOSIS — I255 Ischemic cardiomyopathy: Secondary | ICD-10-CM

## 2020-07-27 NOTE — Telephone Encounter (Signed)
This is a [place] pt °

## 2020-07-27 NOTE — Patient Instructions (Addendum)

## 2020-07-27 NOTE — Progress Notes (Signed)
Cardiology Office Note  Date: 07/27/2020   ID: Tracy Mills, DOB 1946-05-09, MRN [id number]  PCP:  Tracy Specking, MD  Cardiologist:  Tracy Dell, MD Electrophysiologist:  Tracy Range, MD   Chief Complaint  Patient presents with  . Cardiac follow-up    History of Present Illness: Tracy Mills is a 76 y.o. female former patient of Dr. Purvis Mills now presenting to establish follow-up with me.  I reviewed her records and updated the chart.  She was last seen in August 2021 by Mr. Tracy Hews NP.  She presents for a routine visit.  States that she has been doing reasonably well, NYHA class II dyspnea with typical activities, no angina symptoms or nitroglycerin use, no palpitations or syncope.  We did talk about a basic walking plan for exercise.  I reviewed her medications which are outlined below.  She reports no intolerances.  Weight has been stable, no leg swelling.  Requesting interval lab work from Tracy Mills.  She follows with Dr. Johney Mills, Medtronic biventricular ICD in place.  She was most recently seen in January.  Recent device interrogation indicated 95% biventricular pacing.  No device shocks or syncope.  Last echocardiogram was in June 2021 at which point LVEF was 25 to 30%.  Mild to moderate mitral regurgitation noted as well.  Past Medical History:  Diagnosis Date  . Chronic systolic congestive heart failure (HCC)   . Coronary artery disease    Anterior MI in 2010. LAD stent placement 2010, [place].  . Essential hypertension   . History of ventricular fibrillation 2013  . Ischemic cardiomyopathy   . LBBB (left bundle branch block)   . MR (mitral regurgitation)     Past Surgical History:  Procedure Laterality Date  . BI-VENTRICULAR IMPLANTABLE CARDIOVERTER DEFIBRILLATOR UPGRADE  11-16-2013   upgrade of previously implanted dual chamber ICD to MDT Tracy Mills by [name]  . BI-VENTRICULAR IMPLANTABLE CARDIOVERTER DEFIBRILLATOR UPGRADE N/A 11/16/2013    Procedure: BI-VENTRICULAR IMPLANTABLE CARDIOVERTER DEFIBRILLATOR UPGRADE;  Surgeon: Tracy Rhyme, MD;  Location: [facility] CATH LAB;  Service: Cardiovascular;  Laterality: N/A;  . BIV ICD GENERATOR CHANGEOUT N/A 02/24/2020   Procedure: BIV ICD GENERATOR CHANGEOUT;  Surgeon: Tracy Range, MD;  Location: [facility] INVASIVE CV LAB;  Service: Cardiovascular;  Laterality: N/A;  . CARDIAC CATHETERIZATION  2010   LAD PCI and stent placement  . CARDIAC DEFIBRILLATOR PLACEMENT  06/14/09   MDT [place] [name]  . TONSILLECTOMY AND ADENOIDECTOMY    . TUBAL LIGATION      Current Outpatient Medications  Medication Sig Dispense Refill  . acetaminophen (TYLENOL) 500 MG tablet Take 1,000 mg by mouth every 6 (six) hours as needed for moderate pain.     Marland Kitchen aspirin 81 MG tablet Take 81 mg by mouth daily.    . carvedilol (COREG) 3.125 MG tablet TAKE 1 TABLET BY MOUTH 2 (TWO) TIMES DAILY. 180 tablet 3  . cetirizine (ZYRTEC) 10 MG tablet Take 10 mg by mouth daily as needed for allergies.     Marland Kitchen colchicine 0.6 MG tablet Take 0.6 mg by mouth daily as needed (gout).    . furosemide (LASIX) 40 MG tablet TAKE 1 TABLET (40 MG) BY MOUTH DAILY. 90 tablet 3  . nitroGLYCERIN (NITROSTAT) 0.4 MG SL tablet Place 1 tablet (0.4 mg total) under the tongue every 5 (five) minutes x 3 doses as needed for chest pain (if no relief afterwards, proceed to the ED for an evaluation or call 911).  75 tablet 1  . rosuvastatin (CRESTOR) 20 MG tablet Take 1 tablet (20 mg total) by mouth daily. 90 tablet 3  . sacubitril-valsartan (ENTRESTO) 24-26 MG Take 1 tablet by mouth 2 (two) times daily. 180 tablet 3  . spironolactone (ALDACTONE) 25 MG tablet TAKE 1 TABLET BY MOUTH  DAILY 90 tablet 3   No current facility-administered medications for this visit.   Allergies:  Penicillins, Sulfa antibiotics, and Tetracyclines & related   ROS: No syncope.  Physical Exam: VS:  BP (!) 100/52   Pulse (!) 54   Ht 5\' 3"  (1.6 m)   Wt 122 lb (55.3 kg)   SpO2 96%   BMI  21.61 kg/m , BMI Body mass index is 21.61 kg/m.  Wt Readings from Last 3 Encounters:  07/27/20 122 lb (55.3 kg)  06/02/20 125 lb 6.4 oz (56.9 kg)  02/24/20 129 lb (58.5 kg)    General: Patient appears comfortable at rest. HEENT: Conjunctiva and lids normal, wearing a mask. Neck: Supple, no elevated JVP or carotid bruits, no thyromegaly. Lungs: Clear to auscultation, nonlabored breathing at rest. Cardiac: Regular rate and rhythm, no S3, 2/6 systolic murmur, no pericardial rub. Extremities: No pitting edema.  ECG:  An ECG dated 06/02/2020 was personally reviewed today and demonstrated:  Dual-chamber pacing.  Recent Labwork: 02/21/2020: BUN 22; Creatinine, Ser 1.23; Hemoglobin 13.0; Platelets 325; Potassium 4.2; Sodium 138  May 2021: Cholesterol 169, triglycerides 93, HDL 77, LDL 75  Other Studies Reviewed Today:  Echocardiogram 11/18/2019: 1. Left ventricular ejection fraction, by estimation, is 25 to 30%. The  left ventricle has severely decreased function. The left ventricle  demonstrates regional wall motion abnormalities (see scoring  diagram/findings for description). The left  ventricular internal cavity size was mildly dilated. Left ventricular  diastolic parameters are consistent with Grade I diastolic dysfunction  (impaired relaxation).  2. Right ventricular systolic function is normal. The right ventricular  size is normal. A device wire is visualized. Tricuspid regurgitation  signal is inadequate for assessing PA pressure.  3. The mitral valve is grossly normal, mild annular calcification. Mild  to moderate mitral valve regurgitation.  4. The aortic valve is tricuspid. Aortic valve regurgitation is trivial.  5. The inferior vena cava is normal in size with greater than 50%  respiratory variability, suggesting right atrial pressure of 3 mmHg.   Assessment and Plan:  1.  Ischemic cardiomyopathy with chronic systolic heart failure, LVEF 25 to 30% and RV contraction  normal.  She is clinically stable at this time, NYHA class II dyspnea.  No active angina symptoms.  Continue Coreg, Entresto, Aldactone, and Lasix.  Requesting interval lab work from PCP.  Repeat echocardiogram just prior to next visit.  2.  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 11/20/2019.  No device shocks or syncope.  3.  CAD status post anterior infarct in 2010 managed with LAD stent intervention in 2011.  No active angina at this time.  Continue aspirin and Crestor, as needed nitroglycerin available.  Last LDL was 75.  Medication Adjustments/Labs and Tests Ordered: Current medicines are reviewed at length with the patient today.  Concerns regarding medicines are outlined above.   Tests Ordered: Orders Placed This Encounter  Procedures  . ECHOCARDIOGRAM COMPLETE    Medication Changes: No orders of the defined types were placed in this encounter.   Disposition:  Follow up 6 months in the [place] office.  Signed, [place], MD, [facility] 07/27/2020 11:42 AM    [facility] [place], [place], [place] [address] Phone: [phone]; Fax: [phone]

## 2020-07-31 ENCOUNTER — Ambulatory Visit (INDEPENDENT_AMBULATORY_CARE_PROVIDER_SITE_OTHER): Payer: Medicare HMO

## 2020-07-31 DIAGNOSIS — Z95810 Presence of automatic (implantable) cardiac defibrillator: Secondary | ICD-10-CM

## 2020-07-31 DIAGNOSIS — I5022 Chronic systolic (congestive) heart failure: Secondary | ICD-10-CM

## 2020-07-31 NOTE — Progress Notes (Signed)
EPIC Encounter for ICM Monitoring  Patient Name: Tracy Mills is a 75 y.o. female Date: 07/31/2020 Primary Care Physican: Ignatius Specking, MD Primary Cardiologist:McDowell Electrophysiologist: Allred Bi-V Pacing: 94.8% 07/31/2020 Weight:130lbs  Time in AT/AF0.0 hr/day (0.0%)   Spoke with patient and reports having a productive cough and chest congestion.  Weight is stable.   OptivolThoracic impedancesuggesting possible fluid accumulation since 07/14/2020.  TakesFurosemide 40 mgtake1 tablet daily  Labs: 02/21/2020 Creatinine 1.23, BUN 22, Potassium 4.2, Sodium 138, GFR 44-50 09/28/2019 Creatinine1.36, BUN18, Potassium4.5, Sodium139, [id number] A complete set of results can be found in Results Review.  Recommendations: Confirmed she takes Furosemide 40 mg daily.  Advised will send copy of ICM check to Dr Diona Browner for review.   Follow-up plan: ICM clinic phone appointment on3/03/2021 (manual) to recheck fluid levels.91 day device clinic remote transmission4/08/2020.   EP/Cardiology Office Visits:.02/02/2021 with Diona Browner.  06/01/2021 with Dr Johney Frame  Copy of ICM check sent to Dr.Allred.  3 month ICM trend: 07/31/2020.    1 Year ICM trend:       [name], RN 07/31/2020 12:45 PM

## 2020-07-31 NOTE — Progress Notes (Signed)
Received: Today [name], MD  Shanetra Blumenstock, Josephine Igo, RN Cc: Eustace Moore, RN Let's have her double her Lasix for the next 2 days and then resume previous dose.

## 2020-07-31 NOTE — Progress Notes (Signed)
Call to patient.  Advised to double Lasix x 2 days which would be Lasix 40 mg 2 tablets daily x 2 days.  After 2nd day resume 1 tablet daily.  She verbalized understanding and agreed with plan.

## 2020-08-04 ENCOUNTER — Ambulatory Visit (INDEPENDENT_AMBULATORY_CARE_PROVIDER_SITE_OTHER): Payer: Medicare HMO

## 2020-08-04 ENCOUNTER — Telehealth: Payer: Self-pay

## 2020-08-04 DIAGNOSIS — Z95810 Presence of automatic (implantable) cardiac defibrillator: Secondary | ICD-10-CM

## 2020-08-04 DIAGNOSIS — I5022 Chronic systolic (congestive) heart failure: Secondary | ICD-10-CM

## 2020-08-04 NOTE — Progress Notes (Signed)
EPIC Encounter for ICM Monitoring  Patient Name: Tracy Mills is a 75 y.o. female Date: 08/04/2020 Primary Care Physican: Ignatius Specking, MD Primary Cardiologist:McDowell Electrophysiologist: Allred Bi-V Pacing: 97.8% 07/31/2020 Weight:130lbs  Time in AT/AF0.0 hr/day (0.0%)   Attempted call to patient and unable to reach.   Transmission reviewed.   OptivolThoracic impedancesuggesting fluid levels returned to normal after taking Furosemide 2 tablets x 2 days.  TakesFurosemide 40 mgtake1 tablet daily  Labs: 02/21/2020 Creatinine 1.23, BUN 22, Potassium 4.2, Sodium 138, GFR 44-50 09/28/2019 Creatinine1.36, BUN18, Potassium4.5, Sodium139, [id number] A complete set of results can be found in Results Review.  Recommendations: Unable to reach.    Follow-up plan: ICM clinic phone appointment on4/03/2021.91 day device clinic remote transmission4/08/2020.   EP/Cardiology Office Visits:.02/02/2021 with Diona Browner.  06/01/2021 with Dr Johney Frame  Copy of ICM check sent to Dr.Allred.  3 month ICM trend: 08/04/2020.    1 Year ICM trend:       [name], RN 08/04/2020 3:45 PM

## 2020-08-04 NOTE — Telephone Encounter (Signed)
Remote ICM transmission received.  Attempted call to patient regarding ICM remote transmission and no answer or voice mail option.  

## 2020-08-23 DIAGNOSIS — I509 Heart failure, unspecified: Secondary | ICD-10-CM | POA: Diagnosis not present

## 2020-08-23 DIAGNOSIS — R630 Anorexia: Secondary | ICD-10-CM | POA: Diagnosis not present

## 2020-08-23 DIAGNOSIS — Z299 Encounter for prophylactic measures, unspecified: Secondary | ICD-10-CM | POA: Diagnosis not present

## 2020-08-23 DIAGNOSIS — F17210 Nicotine dependence, cigarettes, uncomplicated: Secondary | ICD-10-CM | POA: Diagnosis not present

## 2020-08-23 DIAGNOSIS — E782 Mixed hyperlipidemia: Secondary | ICD-10-CM | POA: Diagnosis not present

## 2020-08-23 DIAGNOSIS — M87059 Idiopathic aseptic necrosis of unspecified femur: Secondary | ICD-10-CM | POA: Diagnosis not present

## 2020-08-23 DIAGNOSIS — I2510 Atherosclerotic heart disease of native coronary artery without angina pectoris: Secondary | ICD-10-CM | POA: Diagnosis not present

## 2020-08-23 DIAGNOSIS — I10 Essential (primary) hypertension: Secondary | ICD-10-CM | POA: Diagnosis not present

## 2020-08-28 ENCOUNTER — Ambulatory Visit (INDEPENDENT_AMBULATORY_CARE_PROVIDER_SITE_OTHER): Payer: Medicare HMO

## 2020-08-28 DIAGNOSIS — I255 Ischemic cardiomyopathy: Secondary | ICD-10-CM

## 2020-08-29 LAB — CUP PACEART REMOTE DEVICE CHECK
Battery Remaining Longevity: 88 mo
Battery Voltage: 3.03 V
Brady Statistic AP VP Percent: 51.42 %
Brady Statistic AP VS Percent: 0.95 %
Brady Statistic AS VP Percent: 46.83 %
Brady Statistic AS VS Percent: 0.8 %
Brady Statistic RA Percent Paced: 52.34 %
Brady Statistic RV Percent Paced: 5.21 %
Date Time Interrogation Session: 20220404022825
HighPow Impedance: 44 Ohm
HighPow Impedance: 54 Ohm
Implantable Lead Implant Date: 20110119
Implantable Lead Implant Date: 20110119
Implantable Lead Implant Date: 20150623
Implantable Lead Location: 753858
Implantable Lead Location: 753859
Implantable Lead Location: 753860
Implantable Lead Model: 4598
Implantable Lead Model: 5076
Implantable Lead Model: 6947
Implantable Pulse Generator Implant Date: 20210930
Lead Channel Impedance Value: 1007 Ohm
Lead Channel Impedance Value: 1083 Ohm
Lead Channel Impedance Value: 1178 Ohm
Lead Channel Impedance Value: 1197 Ohm
Lead Channel Impedance Value: 1254 Ohm
Lead Channel Impedance Value: 278.237
Lead Channel Impedance Value: 289.597
Lead Channel Impedance Value: 299.908
Lead Channel Impedance Value: 330.057
Lead Channel Impedance Value: 346.164
Lead Channel Impedance Value: 361 Ohm
Lead Channel Impedance Value: 418 Ohm
Lead Channel Impedance Value: 456 Ohm
Lead Channel Impedance Value: 513 Ohm
Lead Channel Impedance Value: 608 Ohm
Lead Channel Impedance Value: 665 Ohm
Lead Channel Impedance Value: 722 Ohm
Lead Channel Impedance Value: 893 Ohm
Lead Channel Pacing Threshold Amplitude: 0.5 V
Lead Channel Pacing Threshold Amplitude: 1 V
Lead Channel Pacing Threshold Amplitude: 1.625 V
Lead Channel Pacing Threshold Pulse Width: 0.4 ms
Lead Channel Pacing Threshold Pulse Width: 0.4 ms
Lead Channel Pacing Threshold Pulse Width: 0.8 ms
Lead Channel Sensing Intrinsic Amplitude: 1.625 mV
Lead Channel Sensing Intrinsic Amplitude: 1.625 mV
Lead Channel Sensing Intrinsic Amplitude: 24.875 mV
Lead Channel Sensing Intrinsic Amplitude: 24.875 mV
Lead Channel Setting Pacing Amplitude: 2 V
Lead Channel Setting Pacing Amplitude: 2.75 V
Lead Channel Setting Pacing Amplitude: 3 V
Lead Channel Setting Pacing Pulse Width: 0.4 ms
Lead Channel Setting Pacing Pulse Width: 0.8 ms
Lead Channel Setting Sensing Sensitivity: 0.45 mV

## 2020-09-04 ENCOUNTER — Ambulatory Visit (INDEPENDENT_AMBULATORY_CARE_PROVIDER_SITE_OTHER): Payer: Medicare HMO

## 2020-09-04 DIAGNOSIS — Z95810 Presence of automatic (implantable) cardiac defibrillator: Secondary | ICD-10-CM | POA: Diagnosis not present

## 2020-09-04 DIAGNOSIS — I5022 Chronic systolic (congestive) heart failure: Secondary | ICD-10-CM | POA: Diagnosis not present

## 2020-09-06 NOTE — Progress Notes (Signed)
EPIC Encounter for ICM Monitoring  Patient Name: Tracy Mills is a 75 y.o. female Date: 09/06/2020 Primary Care Physican: Ignatius Specking, MD Primary Cardiologist:McDowell Electrophysiologist: Allred Bi-V Pacing: 97.8% 3/7/2022Weight:130lbs  Time in AT/AF0.0 hr/day (0.0%)   Spoke with patient and reports feeling well at this time.  Denies fluid symptoms.  Patient continues to have a cough and PCP prescribed Pantoprazole.  PCP also suggested Entresto may cause her to have a cough.         Will send a message to [facility] pharmacists asking if [name] has a lists cough as side effect.    OptivolThoracic impedancesuggesting normal fluid levels.  TakesFurosemide 40 mgtake1 tablet daily  Labs: 02/21/2020 Creatinine 1.23, BUN 22, Potassium 4.2, Sodium 138, GFR 44-50 09/28/2019 Creatinine1.36, BUN18, Potassium4.5, Sodium139, [id number] A complete set of results can be found in Results Review.  Recommendations: No changes and encouraged to call if experiencing any fluid symptoms.   Follow-up plan: ICM clinic phone appointment on5/31/2022.91 day device clinic remote transmission7/09/2020.   EP/Cardiology Office Visits:.9/9/2022with Diona Browner. 06/01/2021 with Dr Johney Frame  Copy of ICM check sent to Dr.Allred.  3 month ICM trend: 09/04/2020.    1 Year ICM trend:       [name], RN 09/06/2020 3:59 PM

## 2020-09-07 NOTE — Progress Notes (Signed)
Remote ICD transmission.   

## 2020-09-13 NOTE — Progress Notes (Signed)
Spoke with patient.  Advised of information received from [facility] pharmacist.  She agreed to continue with pantaprozole for the next couple of months to see if cough improves.  Explained if cough does not improve then will discuss with Dr McDowell/Dr Johney Frame about persist cough and Entresto.  Advised do not stop Entresto without discussing with the physician and she agreed with plan.

## 2020-09-13 NOTE — Progress Notes (Signed)
Received message from [facility] pharmacist:  Awilda Metro, RPH-CPP  Tanveer Dobberstein, Josephine Igo, RN Hi Tracy Mills,   Cough has been reported in about 9% of patient taking Entresto. Clinically, I don't see it that frequently - ACE inhibitors are more likely to cause a cough than ARBs are and valsartan is in [place]. I'd have her see if the pantoprazole improves her symptoms. If not, the best way to tell if her Tracy Mills is contributing would be to stop it for a few weeks and see if her symptoms improve, then could rechallenge and see if they come back. Her LVEF is 25-30% though so we ideally would like her to continue on Entresto if possible.   Thanks,  [employer]

## 2020-10-03 DIAGNOSIS — I10 Essential (primary) hypertension: Secondary | ICD-10-CM | POA: Diagnosis not present

## 2020-10-03 DIAGNOSIS — Z6823 Body mass index (BMI) 23.0-23.9, adult: Secondary | ICD-10-CM | POA: Diagnosis not present

## 2020-10-03 DIAGNOSIS — Z1331 Encounter for screening for depression: Secondary | ICD-10-CM | POA: Diagnosis not present

## 2020-10-03 DIAGNOSIS — J309 Allergic rhinitis, unspecified: Secondary | ICD-10-CM | POA: Diagnosis not present

## 2020-10-03 DIAGNOSIS — Z79899 Other long term (current) drug therapy: Secondary | ICD-10-CM | POA: Diagnosis not present

## 2020-10-03 DIAGNOSIS — Z299 Encounter for prophylactic measures, unspecified: Secondary | ICD-10-CM | POA: Diagnosis not present

## 2020-10-03 DIAGNOSIS — E7800 Pure hypercholesterolemia, unspecified: Secondary | ICD-10-CM | POA: Diagnosis not present

## 2020-10-03 DIAGNOSIS — R5383 Other fatigue: Secondary | ICD-10-CM | POA: Diagnosis not present

## 2020-10-03 DIAGNOSIS — F17210 Nicotine dependence, cigarettes, uncomplicated: Secondary | ICD-10-CM | POA: Diagnosis not present

## 2020-10-03 DIAGNOSIS — Z0000 Encounter for general adult medical examination without abnormal findings: Secondary | ICD-10-CM | POA: Diagnosis not present

## 2020-10-03 DIAGNOSIS — Z1339 Encounter for screening examination for other mental health and behavioral disorders: Secondary | ICD-10-CM | POA: Diagnosis not present

## 2020-10-03 DIAGNOSIS — Z7189 Other specified counseling: Secondary | ICD-10-CM | POA: Diagnosis not present

## 2020-10-23 DIAGNOSIS — E7800 Pure hypercholesterolemia, unspecified: Secondary | ICD-10-CM | POA: Diagnosis not present

## 2020-10-23 DIAGNOSIS — E1165 Type 2 diabetes mellitus with hyperglycemia: Secondary | ICD-10-CM | POA: Diagnosis not present

## 2020-10-23 DIAGNOSIS — I10 Essential (primary) hypertension: Secondary | ICD-10-CM | POA: Diagnosis not present

## 2020-10-23 DIAGNOSIS — K219 Gastro-esophageal reflux disease without esophagitis: Secondary | ICD-10-CM | POA: Diagnosis not present

## 2020-10-24 ENCOUNTER — Ambulatory Visit (INDEPENDENT_AMBULATORY_CARE_PROVIDER_SITE_OTHER): Payer: Medicare HMO

## 2020-10-24 DIAGNOSIS — Z95810 Presence of automatic (implantable) cardiac defibrillator: Secondary | ICD-10-CM | POA: Diagnosis not present

## 2020-10-24 DIAGNOSIS — I5022 Chronic systolic (congestive) heart failure: Secondary | ICD-10-CM | POA: Diagnosis not present

## 2020-10-25 NOTE — Progress Notes (Signed)
EPIC Encounter for ICM Monitoring  Patient Name: Tracy Mills is a 75 y.o. female Date: 10/25/2020 Primary Care Physican: Ignatius Specking, MD Primary Cardiologist:McDowell Electrophysiologist: Allred Bi-V Pacing: 93.5% 6/1/2022Weight:130lbs  Time in AT/AF0.0 hr/day (0.0%)   Spoke with patient and reports feeling well at this time. Heart failure questions reviewed. Pt asymptomatic for fluid.   OptivolThoracic impedancesuggesting normalfluid levels.  TakesFurosemide 40 mgtake1 tablet daily  Labs: 02/21/2020 Creatinine 1.23, BUN 22, Potassium 4.2, Sodium 138, GFR 44-50 09/28/2019 Creatinine1.36, BUN18, Potassium4.5, Sodium139, [id number] A complete set of results can be found in Results Review.  Recommendations: No changes and encouraged to call if experiencing any fluid symptoms.  Follow-up plan: ICM clinic phone appointment on7/10/2020.91 day device clinic remote transmission7/09/2020.   EP/Cardiology Office Visits:.9/9/2022with Diona Browner. 06/01/2021 with Dr Johney Frame  Copy of ICM check sent to Dr.Allred.  3 month ICM trend: 10/24/2020.    1 Year ICM trend:       [name], RN 10/25/2020 2:55 PM

## 2020-11-07 ENCOUNTER — Other Ambulatory Visit: Payer: Self-pay | Admitting: Cardiology

## 2020-11-09 ENCOUNTER — Telehealth: Payer: Self-pay | Admitting: Cardiology

## 2020-11-09 MED ORDER — SACUBITRIL-VALSARTAN 24-26 MG PO TABS: 1.0000 | ORAL_TABLET | Freq: Two times a day (BID) | ORAL | 0 refills | Status: DC

## 2020-11-09 NOTE — Telephone Encounter (Signed)
Patient is on vacation . She left her  Entresto 24-26 mg.  At home   [facility] - [phone] [place], [place]

## 2020-11-28 ENCOUNTER — Ambulatory Visit (INDEPENDENT_AMBULATORY_CARE_PROVIDER_SITE_OTHER): Payer: Medicare HMO

## 2020-11-28 DIAGNOSIS — I255 Ischemic cardiomyopathy: Secondary | ICD-10-CM

## 2020-11-28 DIAGNOSIS — I5022 Chronic systolic (congestive) heart failure: Secondary | ICD-10-CM | POA: Diagnosis not present

## 2020-11-29 ENCOUNTER — Ambulatory Visit (INDEPENDENT_AMBULATORY_CARE_PROVIDER_SITE_OTHER): Payer: Medicare HMO

## 2020-11-29 DIAGNOSIS — I5022 Chronic systolic (congestive) heart failure: Secondary | ICD-10-CM

## 2020-11-29 DIAGNOSIS — Z95810 Presence of automatic (implantable) cardiac defibrillator: Secondary | ICD-10-CM | POA: Diagnosis not present

## 2020-11-29 NOTE — Progress Notes (Signed)
EPIC Encounter for ICM Monitoring  Patient Name: Tracy Mills is a 75 y.o. female Date: 11/29/2020 Primary Care Physican: Ignatius Specking, MD Primary Cardiologist: Diona Browner Electrophysiologist: Allred Bi-V Pacing:  97.1%    10/25/2020 Weight: 130 lbs   Time in AT/AF  0.0 hr/day (0.0%)         Spoke with patient and reports feeling well at this time. Heart failure questions reviewed. Pt asymptomatic for fluid.          Optivol Thoracic impedance suggesting normal fluid levels.   Takes Furosemide 40 mg take 1 tablet daily   Labs: 02/21/2020 Creatinine 1.23, BUN 22, Potassium 4.2, Sodium 138, GFR 44-50 09/28/2019 Creatinine 1.36, BUN 18, Potassium 4.5, Sodium 139, GFR 39-45 A complete set of results can be found in Results Review.   Recommendations:  No changes and encouraged to call if experiencing any fluid symptoms.     Follow-up plan: ICM clinic phone appointment on 01/01/2021.   91 day device clinic remote transmission 02/27/2021.     EP/Cardiology Office Visits:.  02/02/2021 with Diona Browner.  06/01/2021 with Dr Johney Frame   Copy of ICM check sent to Dr. Johney Frame.   3 month ICM trend: 11/29/2020.    1 Year ICM trend:       [name], RN 11/29/2020 5:00 PM

## 2020-11-30 LAB — CUP PACEART REMOTE DEVICE CHECK
Battery Remaining Longevity: 94 mo
Battery Voltage: 3 V
Brady Statistic AP VP Percent: 19.1 %
Brady Statistic AP VS Percent: 0.44 %
Brady Statistic AS VP Percent: 78.72 %
Brady Statistic AS VS Percent: 1.74 %
Brady Statistic RA Percent Paced: 19.49 %
Brady Statistic RV Percent Paced: 1.34 %
Date Time Interrogation Session: 20220707031505
HighPow Impedance: 47 Ohm
HighPow Impedance: 56 Ohm
Implantable Lead Implant Date: 20110119
Implantable Lead Implant Date: 20110119
Implantable Lead Implant Date: 20150623
Implantable Lead Location: 753858
Implantable Lead Location: 753859
Implantable Lead Location: 753860
Implantable Lead Model: 4598
Implantable Lead Model: 5076
Implantable Lead Model: 6947
Implantable Pulse Generator Implant Date: 20210930
Lead Channel Impedance Value: 1064 Ohm
Lead Channel Impedance Value: 1121 Ohm
Lead Channel Impedance Value: 1140 Ohm
Lead Channel Impedance Value: 256.5 Ohm
Lead Channel Impedance Value: 274.19 Ohm
Lead Channel Impedance Value: 289.597
Lead Channel Impedance Value: 289.597
Lead Channel Impedance Value: 304 Ohm
Lead Channel Impedance Value: 312.348
Lead Channel Impedance Value: 399 Ohm
Lead Channel Impedance Value: 418 Ohm
Lead Channel Impedance Value: 513 Ohm
Lead Channel Impedance Value: 513 Ohm
Lead Channel Impedance Value: 589 Ohm
Lead Channel Impedance Value: 665 Ohm
Lead Channel Impedance Value: 836 Ohm
Lead Channel Impedance Value: 893 Ohm
Lead Channel Impedance Value: 988 Ohm
Lead Channel Pacing Threshold Amplitude: 0.5 V
Lead Channel Pacing Threshold Amplitude: 1 V
Lead Channel Pacing Threshold Amplitude: 1.5 V
Lead Channel Pacing Threshold Pulse Width: 0.4 ms
Lead Channel Pacing Threshold Pulse Width: 0.4 ms
Lead Channel Pacing Threshold Pulse Width: 0.8 ms
Lead Channel Sensing Intrinsic Amplitude: 1.625 mV
Lead Channel Sensing Intrinsic Amplitude: 1.625 mV
Lead Channel Sensing Intrinsic Amplitude: 21.875 mV
Lead Channel Sensing Intrinsic Amplitude: 21.875 mV
Lead Channel Setting Pacing Amplitude: 2 V
Lead Channel Setting Pacing Amplitude: 2.5 V
Lead Channel Setting Pacing Amplitude: 2.5 V
Lead Channel Setting Pacing Pulse Width: 0.4 ms
Lead Channel Setting Pacing Pulse Width: 0.8 ms
Lead Channel Setting Sensing Sensitivity: 0.45 mV

## 2020-12-18 NOTE — Progress Notes (Signed)
Remote ICD transmission.   

## 2021-01-01 ENCOUNTER — Ambulatory Visit: Payer: Medicare HMO

## 2021-01-01 DIAGNOSIS — I5022 Chronic systolic (congestive) heart failure: Secondary | ICD-10-CM

## 2021-01-01 DIAGNOSIS — Z95810 Presence of automatic (implantable) cardiac defibrillator: Secondary | ICD-10-CM

## 2021-01-02 ENCOUNTER — Telehealth: Payer: Self-pay

## 2021-01-02 NOTE — Progress Notes (Signed)
EPIC Encounter for ICM Monitoring  Patient Name: Tracy Mills is a 75 y.o. female Date: 01/02/2021 Primary Care Physican: Ignatius Specking, MD Electrophysiologist: Allred Bi-V Pacing:  96.6%    10/25/2020 Weight: 130 lbs   Time in AT/AF  0.0 hr/day (0.0%)         Attempted call to patient and unable to reach. Transmission reviewed.          Optivol Thoracic impedance normal but was suggesting possible fluid accumulation from 7/21-7/27.   Takes Furosemide 40 mg take 1 tablet daily   Labs: 02/21/2020 Creatinine 1.23, BUN 22, Potassium 4.2, Sodium 138, GFR 44-50 09/28/2019 Creatinine 1.36, BUN 18, Potassium 4.5, Sodium 139, GFR 39-45 A complete set of results can be found in Results Review.   Recommendations:  Unable to reach.     Follow-up plan: ICM clinic phone appointment on 02/01/2021.   91 day device clinic remote transmission 02/27/2021.     EP/Cardiology Office Visits:.  02/02/2021 with Diona Browner.  06/01/2021 with Dr Johney Frame   Copy of ICM check sent to Dr. Johney Frame.    3 month ICM trend: 01/01/2021.    1 Year ICM trend:       [name], RN 01/02/2021 4:22 PM

## 2021-01-02 NOTE — Telephone Encounter (Signed)
Remote ICM transmission received.  Attempted call to patient regarding ICM remote transmission and no answer or voice mail. ° °

## 2021-01-11 DIAGNOSIS — Z1231 Encounter for screening mammogram for malignant neoplasm of breast: Secondary | ICD-10-CM | POA: Diagnosis not present

## 2021-01-24 DIAGNOSIS — E1165 Type 2 diabetes mellitus with hyperglycemia: Secondary | ICD-10-CM | POA: Diagnosis not present

## 2021-01-24 DIAGNOSIS — E7800 Pure hypercholesterolemia, unspecified: Secondary | ICD-10-CM | POA: Diagnosis not present

## 2021-01-24 DIAGNOSIS — K219 Gastro-esophageal reflux disease without esophagitis: Secondary | ICD-10-CM | POA: Diagnosis not present

## 2021-01-24 DIAGNOSIS — I10 Essential (primary) hypertension: Secondary | ICD-10-CM | POA: Diagnosis not present

## 2021-01-25 ENCOUNTER — Ambulatory Visit (INDEPENDENT_AMBULATORY_CARE_PROVIDER_SITE_OTHER): Payer: Medicare HMO

## 2021-01-25 ENCOUNTER — Other Ambulatory Visit: Payer: Self-pay

## 2021-01-25 DIAGNOSIS — I255 Ischemic cardiomyopathy: Secondary | ICD-10-CM | POA: Diagnosis not present

## 2021-01-25 LAB — ECHOCARDIOGRAM COMPLETE
Area-P 1/2: 2.24 cm2
Calc EF: 39.3 %
MV M vel: 4.12 m/s
MV Peak grad: 67.9 mmHg
S' Lateral: 4.22 cm
Single Plane A2C EF: 41.7 %
Single Plane A4C EF: 34.2 %

## 2021-01-26 ENCOUNTER — Telehealth: Payer: Self-pay

## 2021-01-26 DIAGNOSIS — I5022 Chronic systolic (congestive) heart failure: Secondary | ICD-10-CM

## 2021-01-26 MED ORDER — FUROSEMIDE 20 MG PO TABS: 20.0000 mg | ORAL_TABLET | Freq: Every day | ORAL | 3 refills | Status: DC

## 2021-01-26 MED ORDER — EMPAGLIFLOZIN 10 MG PO TABS: 10.0000 mg | ORAL_TABLET | Freq: Every day | ORAL | 3 refills | Status: DC

## 2021-01-26 NOTE — Telephone Encounter (Signed)
-----   Message from [name], MD sent at 01/25/2021  7:48 PM EDT ----- Results reviewed.  No significant change in LVEF with history of ischemic cardiomyopathy, LVEF 25 to 30% as before.  Mild to moderate mitral regurgitation also stable.  She is on reasonable medical therapy and with biventricular ICD.  Could consider addition of Jardiance 10 mg daily, but with low normal blood pressure would probably want to cut Lasix dose in half and use additional half dose for weight gain as needed.  See if she would like to consider this addition, if so we will repeat BMET in 1 month.

## 2021-01-26 NOTE — Telephone Encounter (Signed)
Called and spoke to patient and made her aware of echo results and recommendations below. Patient agrees to start jardiance 10 mg QD. Instructed the patient to decrease her lasix to 20 mg QD and made her aware that she may take an extra 20 mg QD as needed for weight gain. Made patient aware that she will need to have her labs (BMET) checked in month at [facility]. Patient states that she will go and have it checked on 9/30. Instructed the patient to continue to monitor BP, swelling, and weight with medication changes. Patient verbalized understanding and thanked me for the call. Rxs sent to patient's preferred pharmacy. BMET order faxed to [facility]. Lab order released.

## 2021-02-02 ENCOUNTER — Ambulatory Visit: Payer: Medicare HMO | Admitting: Cardiology

## 2021-02-02 ENCOUNTER — Encounter: Payer: Self-pay | Admitting: Cardiology

## 2021-02-02 VITALS — BP 110/64 | HR 76 | Ht 63.0 in | Wt 130.8 lb

## 2021-02-02 DIAGNOSIS — I25119 Atherosclerotic heart disease of native coronary artery with unspecified angina pectoris: Secondary | ICD-10-CM | POA: Diagnosis not present

## 2021-02-02 DIAGNOSIS — I255 Ischemic cardiomyopathy: Secondary | ICD-10-CM | POA: Diagnosis not present

## 2021-02-02 DIAGNOSIS — I5042 Chronic combined systolic (congestive) and diastolic (congestive) heart failure: Secondary | ICD-10-CM | POA: Diagnosis not present

## 2021-02-02 NOTE — Patient Instructions (Signed)
Medication Instructions:  Your physician recommends that you continue on your current medications as directed. Please refer to the Current Medication list given to you today.  *If you need a refill on your cardiac medications before your next appointment, please call your pharmacy*   Lab Work: BMET If you have labs (blood work) drawn today and your tests are completely normal, you will receive your results only by: MyChart Message (if you have MyChart) OR A paper copy in the mail If you have any lab test that is abnormal or we need to change your treatment, we will call you to review the results.   Testing/Procedures: None   Follow-Up: At [facility], you and your health needs are our priority.  As part of our continuing mission to provide you with exceptional heart care, we have created designated Provider Care Teams.  These Care Teams include your primary Cardiologist (physician) and Advanced Practice Providers (APPs -  Physician Assistants and Nurse Practitioners) who all work together to provide you with the care you need, when you need it.  We recommend signing up for the patient portal called "MyChart".  Sign up information is provided on this After Visit Summary.  MyChart is used to connect with patients for Virtual Visits (Telemedicine).  Patients are able to view lab/test results, encounter notes, upcoming appointments, etc.  Non-urgent messages can be sent to your provider as well.   To learn more about what you can do with MyChart, go to [email or web address].    Your next appointment:   6 month(s)  The format for your next appointment:   In Person  Provider:   Nona Dell, MD   Other Instructions

## 2021-02-02 NOTE — Progress Notes (Signed)
No ICM remote transmission received for 02/01/2021 and next ICM transmission scheduled for 02/06/2021.

## 2021-02-02 NOTE — Progress Notes (Signed)
Cardiology Office Note  Date: 02/02/2021   ID: Tracy Mills, DOB 1945-08-18, MRN [id number]  PCP:  Ignatius Specking, MD  Cardiologist:  Nona Dell, MD Electrophysiologist:  Hillis Range, MD   Chief Complaint  Patient presents with   Cardiac follow-up    History of Present Illness: Tracy Mills is a 75 y.o. female last seen in March.  She is here for a follow-up visit.  Reports no new symptomatology, stable NYHA class II dyspnea and no exertional chest pain.  She has a Medtronic biventricular ICD in place with follow-up by Dr. Johney Frame. Device check in early August showed normal thoracic impedance.  Recent follow-up echocardiogram revealed LVEF 25 to 30% with mild diastolic dysfunction, normal RV contraction, mild to moderate mitral regurgitation.  We discussed the results today.  I went over her medications which are noted below.  She just filled Jardiance and the new dose of Lasix, has not started this yet.  We discussed rationale for optimization of medical therapy and also plan for follow-up lab work after she starts.  Past Medical History:  Diagnosis Date   Chronic systolic congestive heart failure (HCC)    Coronary artery disease    Anterior MI in 2010. LAD stent placement 2010, [place].   Essential hypertension    History of ventricular fibrillation 2013   Ischemic cardiomyopathy    LBBB (left bundle branch block)    MR (mitral regurgitation)     Past Surgical History:  Procedure Laterality Date   BI-VENTRICULAR IMPLANTABLE CARDIOVERTER DEFIBRILLATOR UPGRADE  11-16-2013   upgrade of previously implanted dual chamber ICD to MDT Ovidio Kin XT CRTD by Dr Johney Frame   BI-VENTRICULAR IMPLANTABLE CARDIOVERTER DEFIBRILLATOR UPGRADE N/A 11/16/2013   Procedure: BI-VENTRICULAR IMPLANTABLE CARDIOVERTER DEFIBRILLATOR UPGRADE;  Surgeon: Gardiner Rhyme, MD;  Location: [facility] CATH LAB;  Service: Cardiovascular;  Laterality: N/A;   BIV ICD GENERATOR CHANGEOUT N/A 02/24/2020    Procedure: BIV ICD GENERATOR CHANGEOUT;  Surgeon: Hillis Range, MD;  Location: [facility] INVASIVE CV LAB;  Service: Cardiovascular;  Laterality: N/A;   CARDIAC CATHETERIZATION  2010   LAD PCI and stent placement   CARDIAC DEFIBRILLATOR PLACEMENT  06/14/09   MDT [place] Dr Johney Frame   TONSILLECTOMY AND ADENOIDECTOMY     TUBAL LIGATION      Current Outpatient Medications  Medication Sig Dispense Refill   acetaminophen (TYLENOL) 500 MG tablet Take 1,000 mg by mouth every 6 (six) hours as needed for moderate pain.      aspirin 81 MG tablet Take 81 mg by mouth daily.     carvedilol (COREG) 3.125 MG tablet TAKE 1 TABLET BY MOUTH 2 (TWO) TIMES DAILY. 180 tablet 3   cetirizine (ZYRTEC) 10 MG tablet Take 10 mg by mouth daily as needed for allergies.      colchicine 0.6 MG tablet Take 0.6 mg by mouth daily as needed (gout).     furosemide (LASIX) 20 MG tablet Take 1 tablet (20 mg total) by mouth daily. 90 tablet 3   nitroGLYCERIN (NITROSTAT) 0.4 MG SL tablet DISSOLVE 1 TABLET UNDER THE TONGUE EVERY 5 MINUTES UP TO 3 DOSES AS NEEDED FOR CHEST PAIN. IF NO RELIEF CALL 911 OR GO TO ER 25 tablet 3   rosuvastatin (CRESTOR) 20 MG tablet Take 1 tablet (20 mg total) by mouth daily. 90 tablet 3   sacubitril-valsartan (ENTRESTO) 24-26 MG Take 1 tablet by mouth 2 (two) times daily. 180 tablet 0   spironolactone (ALDACTONE) 25 MG tablet TAKE 1  TABLET BY MOUTH  DAILY 90 tablet 3   empagliflozin (JARDIANCE) 10 MG TABS tablet Take 1 tablet (10 mg total) by mouth daily before breakfast. (Patient not taking: Reported on 02/02/2021) 90 tablet 3   No current facility-administered medications for this visit.   Allergies:  Penicillins, Sulfa antibiotics, and Tetracyclines & related   ROS: No syncope, no orthopnea or PND.  Physical Exam: VS:  BP 110/64   Pulse 76   Ht 5\' 3"  (1.6 m)   Wt 130 lb 12.8 oz (59.3 kg)   BMI 23.17 kg/m , BMI Body mass index is 23.17 kg/m.  Wt Readings from Last 3 Encounters:  02/02/21 130 lb 12.8  oz (59.3 kg)  07/27/20 122 lb (55.3 kg)  06/02/20 125 lb 6.4 oz (56.9 kg)    General: Patient appears comfortable at rest. HEENT: Conjunctiva and lids normal, wearing a mask. Neck: Supple, no elevated JVP or carotid bruits, no thyromegaly. Lungs: Clear to auscultation, nonlabored breathing at rest. Cardiac: Regular rate and rhythm, no S3, 2/6 systolic murmur. Extremities: No pitting edema.  ECG:  An ECG dated 06/02/2020 was personally reviewed today and demonstrated:  Dual chamber pacing.  Recent Labwork: 02/21/2020: BUN 22; Creatinine, Ser 1.23; Hemoglobin 13.0; Platelets 325; Potassium 4.2; Sodium 138   Other Studies Reviewed Today:  Echocardiogram 01/25/2021:  1. The mid to distal anterior, anterlolateral, anteroseptal walls are  akinetic. The apex is akinetic. 03/27/2021 Left ventricular ejection fraction, by  estimation, is 25 to 30%. The left ventricle has severely decreased  function. The left ventricle demonstrates  regional wall motion abnormalities (see scoring diagram/findings for  description). Left ventricular diastolic parameters are consistent with  Grade I diastolic dysfunction (impaired relaxation). The average left  ventricular global longitudinal strain is  -8.7 %. The global longitudinal strain is abnormal.   2. Right ventricular systolic function is normal. The right ventricular  size is normal. Tricuspid regurgitation signal is inadequate for assessing  PA pressure.   3. The mitral valve is abnormal. Mild to moderate mitral valve  regurgitation. No evidence of mitral stenosis.   4. The aortic valve is tricuspid. There is mild calcification of the  aortic valve. There is mild thickening of the aortic valve. Aortic valve  regurgitation is not visualized. No aortic stenosis is present.   5. The visualized portion of the abdominal aorta appears mildly dilated,  diffuse plaque.   Assessment and Plan:  1.  Ischemic cardiomyopathy with LVEF 25 to 30% by recent assessment,  normal RV contraction.  She has chronic combined heart failure with NYHA class II dyspnea.  Recent thoracic impedance normal after period of fluid accumulation.  Plan to continue Coreg, Entresto, and Aldactone.  Starting Jardiance and reducing Lasix dose for now.  Check BMET in 1 month.  2.  Medtronic biventricular ICD in place, followed by Dr. Marland Kitchen.  No device shocks or syncope.  3.  CAD status post anterior infarct in 2010 treated with stent intervention to the LAD.  No active angina at this time.  Continue aspirin and Crestor.  Medication Adjustments/Labs and Tests Ordered: Current medicines are reviewed at length with the patient today.  Concerns regarding medicines are outlined above.   Tests Ordered: No orders of the defined types were placed in this encounter.   Medication Changes: No orders of the defined types were placed in this encounter.   Disposition:  Follow up  6 months.  Signed, 2011, MD, [facility] 02/02/2021 11:22 AM    [facility] at [place], [place], [place] [address] Phone: [phone]; Fax: [phone]

## 2021-02-06 ENCOUNTER — Ambulatory Visit (INDEPENDENT_AMBULATORY_CARE_PROVIDER_SITE_OTHER): Payer: Medicare HMO

## 2021-02-06 DIAGNOSIS — Z95810 Presence of automatic (implantable) cardiac defibrillator: Secondary | ICD-10-CM

## 2021-02-06 DIAGNOSIS — I5042 Chronic combined systolic (congestive) and diastolic (congestive) heart failure: Secondary | ICD-10-CM

## 2021-02-07 ENCOUNTER — Telehealth: Payer: Self-pay

## 2021-02-07 NOTE — Telephone Encounter (Signed)
I spoke with the patient and she agreed to send missed ICM transmission. ?

## 2021-02-08 DIAGNOSIS — I10 Essential (primary) hypertension: Secondary | ICD-10-CM | POA: Diagnosis not present

## 2021-02-08 DIAGNOSIS — I509 Heart failure, unspecified: Secondary | ICD-10-CM | POA: Diagnosis not present

## 2021-02-08 DIAGNOSIS — Z299 Encounter for prophylactic measures, unspecified: Secondary | ICD-10-CM | POA: Diagnosis not present

## 2021-02-08 DIAGNOSIS — L309 Dermatitis, unspecified: Secondary | ICD-10-CM | POA: Diagnosis not present

## 2021-02-09 ENCOUNTER — Telehealth: Payer: Self-pay

## 2021-02-09 NOTE — Progress Notes (Signed)
EPIC Encounter for ICM Monitoring  Patient Name: TOLUWANI RUDER is a 75 y.o. female Date: 02/09/2021 Primary Care Physican: Ignatius Specking, MD Primary Cardiologist: Diona Browner Electrophysiologist: Allred Bi-V Pacing:  96.7%    10/25/2020 Weight: 130 lbs   Time in AT/AF  0.0 hr/day (0.0%)         Attempted call to patient and unable to reach.  Transmission reviewed.          Optivol Thoracic impedance suggesting possible fluid accumulation starting 9/2 and returned close to baseline.   Prescribed: Furosemide 20 mg take 1 tablet daily (decreased from 40 mg 9/9)   Labs: 02/21/2020 Creatinine 1.23, BUN 22, Potassium 4.2, Sodium 138, GFR 44-50 09/28/2019 Creatinine 1.36, BUN 18, Potassium 4.5, Sodium 139, GFR 39-45 A complete set of results can be found in Results Review.   Recommendations:  Unable to reach.     Follow-up plan: ICM clinic phone appointment on 03/12/2021.   91 day device clinic remote transmission 02/27/2021.     EP/Cardiology Office Visits:.  08/10/2021 with Diona Browner.  06/01/2021 with Dr Johney Frame   Copy of ICM check sent to Dr. Johney Frame.   3 month ICM trend: 02/07/2021.    1 Year ICM trend:       [name], RN 02/09/2021 10:06 AM

## 2021-02-09 NOTE — Telephone Encounter (Signed)
Remote ICM transmission received.  Attempted call to patient regarding ICM remote transmission and no answer or voice mail option.  

## 2021-02-27 ENCOUNTER — Ambulatory Visit (INDEPENDENT_AMBULATORY_CARE_PROVIDER_SITE_OTHER): Payer: Medicare HMO

## 2021-02-27 DIAGNOSIS — I5042 Chronic combined systolic (congestive) and diastolic (congestive) heart failure: Secondary | ICD-10-CM | POA: Diagnosis not present

## 2021-02-27 DIAGNOSIS — I255 Ischemic cardiomyopathy: Secondary | ICD-10-CM

## 2021-02-28 LAB — CUP PACEART REMOTE DEVICE CHECK
Battery Remaining Longevity: 93 mo
Battery Voltage: 3 V
Brady Statistic AP VP Percent: 45.08 %
Brady Statistic AP VS Percent: 0.92 %
Brady Statistic AS VP Percent: 53.08 %
Brady Statistic AS VS Percent: 0.92 %
Brady Statistic RA Percent Paced: 45.91 %
Brady Statistic RV Percent Paced: 8.22 %
Date Time Interrogation Session: 20221005003532
HighPow Impedance: 45 Ohm
HighPow Impedance: 58 Ohm
Implantable Lead Implant Date: 20110119
Implantable Lead Implant Date: 20110119
Implantable Lead Implant Date: 20150623
Implantable Lead Location: 753858
Implantable Lead Location: 753859
Implantable Lead Location: 753860
Implantable Lead Model: 4598
Implantable Lead Model: 5076
Implantable Lead Model: 6947
Implantable Pulse Generator Implant Date: 20210930
Lead Channel Impedance Value: 1064 Ohm
Lead Channel Impedance Value: 1121 Ohm
Lead Channel Impedance Value: 1140 Ohm
Lead Channel Impedance Value: 250.943
Lead Channel Impedance Value: 262.946
Lead Channel Impedance Value: 283.468
Lead Channel Impedance Value: 302.831
Lead Channel Impedance Value: 320.485
Lead Channel Impedance Value: 342 Ohm
Lead Channel Impedance Value: 418 Ohm
Lead Channel Impedance Value: 456 Ohm
Lead Channel Impedance Value: 475 Ohm
Lead Channel Impedance Value: 532 Ohm
Lead Channel Impedance Value: 589 Ohm
Lead Channel Impedance Value: 703 Ohm
Lead Channel Impedance Value: 817 Ohm
Lead Channel Impedance Value: 893 Ohm
Lead Channel Impedance Value: 988 Ohm
Lead Channel Pacing Threshold Amplitude: 0.5 V
Lead Channel Pacing Threshold Amplitude: 0.875 V
Lead Channel Pacing Threshold Amplitude: 1.25 V
Lead Channel Pacing Threshold Pulse Width: 0.4 ms
Lead Channel Pacing Threshold Pulse Width: 0.4 ms
Lead Channel Pacing Threshold Pulse Width: 0.8 ms
Lead Channel Sensing Intrinsic Amplitude: 1 mV
Lead Channel Sensing Intrinsic Amplitude: 1 mV
Lead Channel Sensing Intrinsic Amplitude: 23.25 mV
Lead Channel Sensing Intrinsic Amplitude: 23.25 mV
Lead Channel Setting Pacing Amplitude: 2 V
Lead Channel Setting Pacing Amplitude: 2.25 V
Lead Channel Setting Pacing Amplitude: 2.5 V
Lead Channel Setting Pacing Pulse Width: 0.4 ms
Lead Channel Setting Pacing Pulse Width: 0.8 ms
Lead Channel Setting Sensing Sensitivity: 0.45 mV

## 2021-03-03 ENCOUNTER — Other Ambulatory Visit: Payer: Self-pay | Admitting: Cardiology

## 2021-03-07 NOTE — Progress Notes (Signed)
Remote ICD transmission.   

## 2021-03-12 ENCOUNTER — Ambulatory Visit (INDEPENDENT_AMBULATORY_CARE_PROVIDER_SITE_OTHER): Payer: Medicare HMO

## 2021-03-12 DIAGNOSIS — I5042 Chronic combined systolic (congestive) and diastolic (congestive) heart failure: Secondary | ICD-10-CM | POA: Diagnosis not present

## 2021-03-12 DIAGNOSIS — Z95810 Presence of automatic (implantable) cardiac defibrillator: Secondary | ICD-10-CM

## 2021-03-16 ENCOUNTER — Telehealth: Payer: Self-pay

## 2021-03-16 NOTE — Telephone Encounter (Signed)
Remote ICM transmission received.  Attempted call to patient regarding ICM remote transmission and phone disconnected after several rings. 

## 2021-03-16 NOTE — Progress Notes (Signed)
EPIC Encounter for ICM Monitoring  Patient Name: Tracy Mills is a 75 y.o. female Date: 03/16/2021 Primary Care Physican: Ignatius Specking, MD Primary Cardiologist: Diona Browner Electrophysiologist: Allred Bi-V Pacing:  91.5%    10/25/2020 Weight: 130 lbs   Time in AT/AF  0.0 hr/day (0.0%)         Attempted call to patient and unable to reach.  Transmission reviewed.          Optivol Thoracic impedance normal but was suggesting possible fluid accumulation 10/3-10/10.   Prescribed: Furosemide 20 mg take 1 tablet daily    Labs: 02/21/2020 Creatinine 1.23, BUN 22, Potassium 4.2, Sodium 138, GFR 44-50 09/28/2019 Creatinine 1.36, BUN 18, Potassium 4.5, Sodium 139, GFR 39-45 A complete set of results can be found in Results Review.   Recommendations:  Unable to reach.     Follow-up plan: ICM clinic phone appointment on 04/23/2021.   91 day device clinic remote transmission 05/29/2021.     EP/Cardiology Office Visits:.  08/10/2021 with Diona Browner.  06/01/2021 with Dr Johney Frame   Copy of ICM check sent to Dr. Johney Frame.    3 month ICM trend: 03/12/2021.    1 Year ICM trend:       [name], RN 03/16/2021 12:29 PM

## 2021-03-26 DIAGNOSIS — I10 Essential (primary) hypertension: Secondary | ICD-10-CM | POA: Diagnosis not present

## 2021-03-26 DIAGNOSIS — E1165 Type 2 diabetes mellitus with hyperglycemia: Secondary | ICD-10-CM | POA: Diagnosis not present

## 2021-03-26 DIAGNOSIS — E7800 Pure hypercholesterolemia, unspecified: Secondary | ICD-10-CM | POA: Diagnosis not present

## 2021-03-26 DIAGNOSIS — K219 Gastro-esophageal reflux disease without esophagitis: Secondary | ICD-10-CM | POA: Diagnosis not present

## 2021-04-23 ENCOUNTER — Ambulatory Visit (INDEPENDENT_AMBULATORY_CARE_PROVIDER_SITE_OTHER): Payer: Medicare HMO

## 2021-04-23 DIAGNOSIS — I5042 Chronic combined systolic (congestive) and diastolic (congestive) heart failure: Secondary | ICD-10-CM

## 2021-04-23 DIAGNOSIS — Z95810 Presence of automatic (implantable) cardiac defibrillator: Secondary | ICD-10-CM | POA: Diagnosis not present

## 2021-04-24 ENCOUNTER — Telehealth: Payer: Self-pay

## 2021-04-24 NOTE — Telephone Encounter (Signed)
Remote ICM transmission received.  Attempted call to patient regarding ICM remote transmission and no answer or voice mail option.  

## 2021-04-24 NOTE — Progress Notes (Signed)
EPIC Encounter for ICM Monitoring  Patient Name: Tracy Mills is a 75 y.o. female Date: 04/24/2021 Primary Care Physican: Ignatius Specking, MD Primary Cardiologist: Diona Browner Electrophysiologist: Allred Bi-V Pacing:  97.2%    10/25/2020 Weight: 130 lbs   Time in AT/AF  0.0 hr/day (0.0%)         Attempted call to patient and unable to reach.  Transmission reviewed.          Optivol Thoracic impedance suggesting possible fluid accumulation starting 11/23.   Prescribed: Furosemide 20 mg take 1 tablet daily    Labs: 02/21/2020 Creatinine 1.23, BUN 22, Potassium 4.2, Sodium 138, GFR 44-50 09/28/2019 Creatinine 1.36, BUN 18, Potassium 4.5, Sodium 139, GFR 39-45 A complete set of results can be found in Results Review.   Recommendations:  Unable to reach.     Follow-up plan: ICM clinic phone appointment on 05/01/2021 to recheck fluid levels.   91 day device clinic remote transmission 05/29/2021.     EP/Cardiology Office Visits:.  08/10/2021 with Diona Browner.  06/01/2021 with Dr Johney Frame   Copy of ICM check sent to Dr. Johney Frame.  Will send to Dr Diona Browner for review if patient is reached.   3 month ICM trend: 04/23/2021.    12-14 Month ICM trend:       [name], RN 04/24/2021 3:18 PM

## 2021-04-29 ENCOUNTER — Other Ambulatory Visit: Payer: Self-pay | Admitting: Cardiology

## 2021-04-29 DIAGNOSIS — E785 Hyperlipidemia, unspecified: Secondary | ICD-10-CM

## 2021-05-01 ENCOUNTER — Ambulatory Visit (INDEPENDENT_AMBULATORY_CARE_PROVIDER_SITE_OTHER): Payer: Self-pay

## 2021-05-01 DIAGNOSIS — Z95810 Presence of automatic (implantable) cardiac defibrillator: Secondary | ICD-10-CM

## 2021-05-01 DIAGNOSIS — I5042 Chronic combined systolic (congestive) and diastolic (congestive) heart failure: Secondary | ICD-10-CM

## 2021-05-02 NOTE — Progress Notes (Signed)
EPIC Encounter for ICM Monitoring  Patient Name: Tracy Mills is a 75 y.o. female Date: 05/02/2021 Primary Care Physican: Ignatius Specking, MD Primary Cardiologist: Diona Browner Electrophysiologist: Allred Bi-V Pacing:  97.3%    10/25/2020 Weight: 130 lbs   Time in AT/AF  0.0 hr/day (0.0%)         Transmission reviewed.          Optivol Thoracic impedance suggesting fluid levels returned to normal.   Prescribed: Furosemide 20 mg take 1 tablet daily    Labs: 02/21/2020 Creatinine 1.23, BUN 22, Potassium 4.2, Sodium 138, GFR 44-50 09/28/2019 Creatinine 1.36, BUN 18, Potassium 4.5, Sodium 139, GFR 39-45 A complete set of results can be found in Results Review.   Recommendations:  No changes.   Follow-up plan: ICM clinic phone appointment on 06/04/2021.   91 day device clinic remote transmission 05/29/2021.     EP/Cardiology Office Visits:.  08/10/2021 with Diona Browner.  06/01/2021 with Dr Johney Frame   Copy of ICM check sent to Dr. Johney Frame.  3 month ICM trend: 05/01/2021.    12-14 Month ICM trend:       [name], RN 05/02/2021 2:51 PM

## 2021-05-21 ENCOUNTER — Telehealth: Payer: Self-pay | Admitting: Student in an Organized Health Care Education/Training Program

## 2021-05-21 NOTE — Telephone Encounter (Signed)
Paged by operator that patient had alert on ICD. MT CRTD. Spoke to patient and she was falling asleep when device beeped at her, no ICD shock. Asked her to transmit remote upload and will have team review report.

## 2021-05-22 ENCOUNTER — Emergency Department (HOSPITAL_COMMUNITY)
Admission: EM | Admit: 2021-05-22 | Discharge: 2021-05-22 | Disposition: A | Discharge status: Home / Self Care | Payer: Medicare HMO | Attending: Emergency Medicine | Admitting: Emergency Medicine

## 2021-05-22 ENCOUNTER — Emergency Department (HOSPITAL_COMMUNITY): Payer: Medicare HMO

## 2021-05-22 ENCOUNTER — Other Ambulatory Visit: Payer: Self-pay

## 2021-05-22 ENCOUNTER — Telehealth: Payer: Self-pay

## 2021-05-22 DIAGNOSIS — Z20822 Contact with and (suspected) exposure to covid-19: Secondary | ICD-10-CM | POA: Diagnosis not present

## 2021-05-22 DIAGNOSIS — R42 Dizziness and giddiness: Secondary | ICD-10-CM | POA: Diagnosis not present

## 2021-05-22 DIAGNOSIS — R059 Cough, unspecified: Secondary | ICD-10-CM | POA: Insufficient documentation

## 2021-05-22 DIAGNOSIS — Z79899 Other long term (current) drug therapy: Secondary | ICD-10-CM | POA: Insufficient documentation

## 2021-05-22 DIAGNOSIS — Z7982 Long term (current) use of aspirin: Secondary | ICD-10-CM | POA: Diagnosis not present

## 2021-05-22 DIAGNOSIS — F17210 Nicotine dependence, cigarettes, uncomplicated: Secondary | ICD-10-CM | POA: Diagnosis not present

## 2021-05-22 DIAGNOSIS — I2510 Atherosclerotic heart disease of native coronary artery without angina pectoris: Secondary | ICD-10-CM | POA: Diagnosis not present

## 2021-05-22 DIAGNOSIS — I10 Essential (primary) hypertension: Secondary | ICD-10-CM | POA: Diagnosis not present

## 2021-05-22 DIAGNOSIS — I110 Hypertensive heart disease with heart failure: Secondary | ICD-10-CM | POA: Insufficient documentation

## 2021-05-22 DIAGNOSIS — I5022 Chronic systolic (congestive) heart failure: Secondary | ICD-10-CM | POA: Insufficient documentation

## 2021-05-22 DIAGNOSIS — I517 Cardiomegaly: Secondary | ICD-10-CM | POA: Diagnosis not present

## 2021-05-22 DIAGNOSIS — Z950 Presence of cardiac pacemaker: Secondary | ICD-10-CM | POA: Insufficient documentation

## 2021-05-22 LAB — COMPREHENSIVE METABOLIC PANEL
ALT: 10 U/L (ref 0–44)
AST: 16 U/L (ref 15–41)
Albumin: 3.5 g/dL (ref 3.5–5.0)
Alkaline Phosphatase: 45 U/L (ref 38–126)
Anion gap: 9 (ref 5–15)
BUN: 15 mg/dL (ref 8–23)
CO2: 24 mmol/L (ref 22–32)
Calcium: 9.6 mg/dL (ref 8.9–10.3)
Chloride: 105 mmol/L (ref 98–111)
Creatinine, Ser: 1.17 mg/dL — ABNORMAL HIGH (ref 0.44–1.00)
GFR, Estimated: 49 mL/min — ABNORMAL LOW (ref >60)
Glucose, Bld: 94 mg/dL (ref 70–99)
Potassium: 3.8 mmol/L (ref 3.5–5.1)
Sodium: 138 mmol/L (ref 135–145)
Total Bilirubin: 0.6 mg/dL (ref 0.3–1.2)
Total Protein: 6.6 g/dL (ref 6.5–8.1)

## 2021-05-22 LAB — CBC WITH DIFFERENTIAL/PLATELET
Abs Immature Granulocytes: 0.02 10*3/uL (ref 0.00–0.07)
Basophils Absolute: 0.1 10*3/uL (ref 0.0–0.1)
Basophils Relative: 1 %
Eosinophils Absolute: 0.3 10*3/uL (ref 0.0–0.5)
Eosinophils Relative: 6 %
HCT: 37.2 % (ref 36.0–46.0)
Hemoglobin: 12.3 g/dL (ref 12.0–15.0)
Immature Granulocytes: 0 %
Lymphocytes Relative: 39 %
Lymphs Abs: 2.2 10*3/uL (ref 0.7–4.0)
MCH: 31 pg (ref 26.0–34.0)
MCHC: 33.1 g/dL (ref 30.0–36.0)
MCV: 93.7 fL (ref 80.0–100.0)
Monocytes Absolute: 0.6 10*3/uL (ref 0.1–1.0)
Monocytes Relative: 11 %
Neutro Abs: 2.4 10*3/uL (ref 1.7–7.7)
Neutrophils Relative %: 43 %
Platelets: 300 10*3/uL (ref 150–400)
RBC: 3.97 MIL/uL (ref 3.87–5.11)
RDW: 14.6 % (ref 11.5–15.5)
WBC: 5.7 10*3/uL (ref 4.0–10.5)
nRBC: 0 % (ref 0.0–0.2)

## 2021-05-22 LAB — MAGNESIUM: Magnesium: 2.1 mg/dL (ref 1.7–2.4)

## 2021-05-22 LAB — RESP PANEL BY RT-PCR (FLU A&B, COVID) ARPGX2
Influenza A by PCR: NEGATIVE
Influenza B by PCR: NEGATIVE
SARS Coronavirus 2 by RT PCR: NEGATIVE

## 2021-05-22 NOTE — Telephone Encounter (Signed)
Patient would like a call back about her transmission she would like someone to go over it with her

## 2021-05-22 NOTE — ED Triage Notes (Signed)
Pt reports her pacemaker has fired 3 times in the last 2 hours. Pt denies any chest pain.

## 2021-05-22 NOTE — ED Notes (Signed)
Medtronic pacemaker interrogated °

## 2021-05-22 NOTE — Telephone Encounter (Signed)
Successful telephone encounter to patient discuss her most recent remote transmission. Patient presented to the ED 05/22/21 at approximately 2am complaining of her device beeping. Upon interrogation from industry rep it was discovered patient had a magnet response, possibly from her cell phone but remains uncertain. Patient actually calling to confirm that Dr. Johney Frame would be available during her 06/01/21 appointment as she received a letter stating he would no longer be in the office after 05/29/21. Reassured patient that her appointment with Dr. Johney Frame in [place] on 06/01/21 as scheduled. Patient appreciative of follow up.

## 2021-05-22 NOTE — ED Provider Notes (Signed)
Emergency Medicine Provider Triage Evaluation Note  Tracy Mills , a 75 y.o. female  was evaluated in triage.  Pt complains of defibrillator going off 3 times in the past 2 hours. Patient reports she has had a mild cough productive of phlegm sputum for the past few days but otherwise has been in her usual state of health, went to bed tonight and woke up to her defibrillator going off. When it went off it made her a bit dizzy, no other sxs, currently feels okay. Denies chest pain, dyspnea, syncope, fever, vomiting or diarrhea.   Review of Systems  Positive: Per above Negative: Per above  Physical Exam  BP 133/77 (BP Location: Right Arm)    Pulse 96    Temp 97.9 F (36.6 C)    Resp 17    Ht 5\' 3"  (1.6 m)    Wt 59 kg    SpO2 96%    BMI 23.03 kg/m  Gen:   Awake, no distress   Resp:  Normal effort  MSK:   Moves extremities without difficulty   Medical Decision Making  Medically screening exam initiated at 2:38 AM.  Appropriate orders placed.  Tracy Mills was informed that the remainder of the evaluation will be completed by another provider, this initial triage assessment does not replace that evaluation, and the importance of remaining in the ED until their evaluation is complete.     Maryann Conners, Cherly Anderson 05/22/21 [id number]    05/24/21, MD 05/22/21 [phone]

## 2021-05-22 NOTE — ED Provider Notes (Signed)
[facility] EMERGENCY DEPARTMENT Provider Note  CSN: [id number] Arrival date & time: 05/22/21 9518    History Chief Complaint  Patient presents with   Pacemaker Problem    Tracy Mills is a 75 y.o. female with history of CAD s/p pacer/AICD had her generator changed a few months ago. During the night she noted that her device was beeping at her. It did not deliver a shock. She was just sitting on the couch where apparently she had fallen asleep watching TV. She reports she was feeling a little dizzy but no CP. She has had some cough recently but also continues to smoke. No fever. N/V/D or dsyuria. She has otherwise been in her usual state of health.    Past Medical History:  Diagnosis Date   Chronic systolic congestive heart failure (HCC)    Coronary artery disease    Anterior MI in 2010. LAD stent placement 2010, [place].   Essential hypertension    History of ventricular fibrillation 2013   Ischemic cardiomyopathy    LBBB (left bundle branch block)    MR (mitral regurgitation)     Past Surgical History:  Procedure Laterality Date   BI-VENTRICULAR IMPLANTABLE CARDIOVERTER DEFIBRILLATOR UPGRADE  11-16-2013   upgrade of previously implanted dual chamber ICD to MDT Ovidio Kin XT CRTD by Dr Johney Frame   BI-VENTRICULAR IMPLANTABLE CARDIOVERTER DEFIBRILLATOR UPGRADE N/A 11/16/2013   Procedure: BI-VENTRICULAR IMPLANTABLE CARDIOVERTER DEFIBRILLATOR UPGRADE;  Surgeon: Gardiner Rhyme, MD;  Location: [facility] CATH LAB;  Service: Cardiovascular;  Laterality: N/A;   BIV ICD GENERATOR CHANGEOUT N/A 02/24/2020   Procedure: BIV ICD GENERATOR CHANGEOUT;  Surgeon: Hillis Range, MD;  Location: [facility] INVASIVE CV LAB;  Service: Cardiovascular;  Laterality: N/A;   CARDIAC CATHETERIZATION  2010   LAD PCI and stent placement   CARDIAC DEFIBRILLATOR PLACEMENT  06/14/09   MDT [facility] Johney Frame   TONSILLECTOMY AND ADENOIDECTOMY     TUBAL LIGATION      Family History  Problem Relation Age of Onset   Diabetes  type II Mother    Other Father        accident   Heart attack Brother 4       cardiac arrest   Seizures Sister    Other Brother        pacemaker    Social History   Tobacco Use   Smoking status: Some Days    Packs/day: 0.20    Years: 45.00    Pack years: 9.00    Types: Cigarettes    Start date: 04/07/1966    Last attempt to quit: 06/04/2016    Years since quitting: 4.9   Smokeless tobacco: Never   Tobacco comments:    smokes socially/ smokes 2 cigarettes per month, trying to quit- quit 6 months ago 03/07/15  Vaping Use   Vaping Use: Never used  Substance Use Topics   Alcohol use: Not Currently    Alcohol/week: 0.0 standard drinks    Comment: occasional alcohol use   Drug use: No     Home Medications Prior to Admission medications   Medication Sig Start Date End Date Taking? Authorizing Provider  acetaminophen (TYLENOL) 500 MG tablet Take 1,000 mg by mouth every 6 (six) hours as needed for moderate pain.    Yes [provider]  aspirin 81 MG tablet Take 81 mg by mouth daily.   Yes [provider]  carvedilol (COREG) 3.125 MG tablet TAKE 1 TABLET TWICE DAILY Patient taking differently: Take 3.125 mg by  mouth in the morning and at bedtime. 04/30/21  Yes [name], MD  cetirizine (ZYRTEC) 10 MG tablet Take 10 mg by mouth daily as needed for allergies.    Yes [provider]  colchicine 0.6 MG tablet Take 0.6 mg by mouth daily as needed (gout).   Yes [provider]  ENTRESTO 24-26 MG TAKE 1 TABLET TWICE DAILY Patient taking differently: Take 1 tablet by mouth 2 (two) times daily. 04/30/21  Yes [name], MD  furosemide (LASIX) 20 MG tablet Take 1 tablet (20 mg total) by mouth daily. 01/26/21  Yes [name], MD  Naphazoline-Pheniramine (ALLERGY EYE OP) Place 1 drop into both eyes daily as needed (itchy eyes/allergies).   Yes [provider]  nitroGLYCERIN (NITROSTAT) 0.4 MG SL tablet DISSOLVE 1 TABLET UNDER  THE TONGUE EVERY 5 MINUTES UP TO 3 DOSES AS NEEDED FOR CHEST PAIN. IF NO RELIEF CALL 911 OR GO TO ER Patient taking differently: Place 0.4 mg under the tongue every 5 (five) minutes x 3 doses as needed for chest pain. 03/05/21  Yes [name], MD  pantoprazole (PROTONIX) 40 MG tablet Take 40 mg by mouth daily as needed (heartburn).   Yes [provider]  rosuvastatin (CRESTOR) 20 MG tablet TAKE 1 TABLET EVERY DAY Patient taking differently: Take 20 mg by mouth at bedtime. 04/30/21  Yes [name], MD  spironolactone (ALDACTONE) 25 MG tablet TAKE 1 TABLET EVERY DAY Patient taking differently: Take 25 mg by mouth at bedtime. 04/30/21  Yes [name], MD  triamcinolone cream (KENALOG) 0.5 % Apply 1 application topically 2 (two) times daily as needed (rash/irritation).   Yes [provider]  empagliflozin (JARDIANCE) 10 MG TABS tablet Take 1 tablet (10 mg total) by mouth daily before breakfast. Patient not taking: Reported on 02/02/2021 01/26/21   [name], MD     Allergies    Penicillins, Sulfa antibiotics, and Tetracyclines & related   Review of Systems   Review of Systems  A comprehensive review of systems was completed and negative except as noted in HPI.   Physical Exam BP 111/71    Pulse (!) 58    Temp 97.9 F (36.6 C)    Resp 13    Ht 5\' 3"  (1.6 m)    Wt 59 kg    SpO2 100%    BMI 23.03 kg/m   Physical Exam Vitals and nursing note reviewed.  Constitutional:      Appearance: Normal appearance.  HENT:     Head: Normocephalic and atraumatic.     Nose: Nose normal.     Mouth/Throat:     Mouth: Mucous membranes are moist.  Eyes:     Extraocular Movements: Extraocular movements intact.     Conjunctiva/sclera: Conjunctivae normal.  Cardiovascular:     Rate and Rhythm: Normal rate.  Pulmonary:     Effort: Pulmonary effort is normal.     Breath sounds: Normal breath sounds.     Comments: Pacer/AICD device in L upper chest Abdominal:      General: Abdomen is flat.     Palpations: Abdomen is soft.     Tenderness: There is no abdominal tenderness.  Musculoskeletal:        General: No swelling. Normal range of motion.     Cervical back: Neck supple.  Skin:    General: Skin is warm and dry.  Neurological:     General: No focal deficit present.  Mental Status: She is alert.  Psychiatric:        Mood and Affect: Mood normal.     ED Results / Procedures / Treatments   Labs (all labs ordered are listed, but only abnormal results are displayed) Labs Reviewed  COMPREHENSIVE METABOLIC PANEL - Abnormal; Notable for the following components:      Result Value   Creatinine, Ser 1.17 (*)    GFR, Estimated 49 (*)    All other components within normal limits  RESP PANEL BY RT-PCR (FLU A&B, COVID) ARPGX2  CBC WITH DIFFERENTIAL/PLATELET  MAGNESIUM    EKG EKG Interpretation  Date/Time:  Tuesday May 22 2021 02:46:53 EST Ventricular Rate:  64 PR Interval:  194 QRS Duration: 118 QT Interval:  478 QTC Calculation: 493 R Axis:   -44 Text Interpretation: AV dual-paced rhythm with Premature atrial complexes with Abberant conduction Abnormal ECG When compared with ECG of 02/24/2020, Premature ventricular complexes are now present Confirmed by Dione Booze ([id number]) on 05/22/2021 3:03:38 AM  Radiology DG Chest 2 View  Result Date: 05/22/2021 CLINICAL DATA:  Beeping sound from pacemaker since today. EXAM: CHEST - 2 VIEW COMPARISON:  November 17, 2013 FINDINGS: A multi lead AICD is noted with stable lead wire positioning. The cardiac silhouette is mildly enlarged. Both lungs are clear. The visualized skeletal structures are unremarkable. IMPRESSION: Stable exam without active cardiopulmonary disease. Electronically Signed   By: Aram Candela M.D.   On: 05/22/2021 03:44    Procedures Procedures  Medications Ordered in the ED Medications - No data to display   MDM Rules/Calculators/A&P MDM   ED Course  I have  reviewed the triage vital signs and the nursing notes.  Pertinent labs & imaging results that were available during my care of the patient were reviewed by me and considered in my medical decision making (see chart for details).  Clinical Course as of 05/22/21 0956  Tue May 22, 2021  0946 Spoke with [employer] rep. The device interrogation is normal. He states that the beep the patient is describing (a constant tone) is for when the device is near a magnet. The patient reports the device did the same beep a short time ago as she was on her phone with the [facility] and stopped when she moved her phone away. She was advised this is likely the source of the beeping and to try to keep her phone away from the device. Otherwise she is doing well, labs and imaging done in triage are unremarkable and she is ready for discharge home.  [CS]    Clinical Course User Index [CS] Pollyann Savoy, MD    Final Clinical Impression(s) / ED Diagnoses Final diagnoses:  Cardiac pacemaker    Rx / DC Orders ED Discharge Orders     None        Pollyann Savoy, MD 05/22/21 [phone]

## 2021-05-29 ENCOUNTER — Ambulatory Visit (INDEPENDENT_AMBULATORY_CARE_PROVIDER_SITE_OTHER): Payer: No Typology Code available for payment source

## 2021-05-29 DIAGNOSIS — Z95810 Presence of automatic (implantable) cardiac defibrillator: Secondary | ICD-10-CM | POA: Diagnosis not present

## 2021-05-29 DIAGNOSIS — I255 Ischemic cardiomyopathy: Secondary | ICD-10-CM

## 2021-05-29 DIAGNOSIS — I5042 Chronic combined systolic (congestive) and diastolic (congestive) heart failure: Secondary | ICD-10-CM

## 2021-05-29 LAB — CUP PACEART REMOTE DEVICE CHECK
Battery Remaining Longevity: 88 mo
Battery Voltage: 2.99 V
Brady Statistic AP VP Percent: 63.95 %
Brady Statistic AP VS Percent: 1.37 %
Brady Statistic AS VP Percent: 34.15 %
Brady Statistic AS VS Percent: 0.53 %
Brady Statistic RA Percent Paced: 65.22 %
Brady Statistic RV Percent Paced: 5.12 %
Date Time Interrogation Session: 20230103072406
HighPow Impedance: 47 Ohm
HighPow Impedance: 58 Ohm
Implantable Lead Implant Date: 20110119
Implantable Lead Implant Date: 20110119
Implantable Lead Implant Date: 20150623
Implantable Lead Location: 753858
Implantable Lead Location: 753859
Implantable Lead Location: 753860
Implantable Lead Model: 4598
Implantable Lead Model: 5076
Implantable Lead Model: 6947
Implantable Pulse Generator Implant Date: 20210930
Lead Channel Impedance Value: 1026 Ohm
Lead Channel Impedance Value: 1121 Ohm
Lead Channel Impedance Value: 1140 Ohm
Lead Channel Impedance Value: 228 Ohm
Lead Channel Impedance Value: 237.865
Lead Channel Impedance Value: 254.534
Lead Channel Impedance Value: 302.831
Lead Channel Impedance Value: 320.485
Lead Channel Impedance Value: 342 Ohm
Lead Channel Impedance Value: 399 Ohm
Lead Channel Impedance Value: 418 Ohm
Lead Channel Impedance Value: 456 Ohm
Lead Channel Impedance Value: 532 Ohm
Lead Channel Impedance Value: 589 Ohm
Lead Channel Impedance Value: 703 Ohm
Lead Channel Impedance Value: 760 Ohm
Lead Channel Impedance Value: 836 Ohm
Lead Channel Impedance Value: 874 Ohm
Lead Channel Pacing Threshold Amplitude: 0.5 V
Lead Channel Pacing Threshold Amplitude: 1 V
Lead Channel Pacing Threshold Amplitude: 1.25 V
Lead Channel Pacing Threshold Pulse Width: 0.4 ms
Lead Channel Pacing Threshold Pulse Width: 0.4 ms
Lead Channel Pacing Threshold Pulse Width: 0.8 ms
Lead Channel Sensing Intrinsic Amplitude: 1.625 mV
Lead Channel Sensing Intrinsic Amplitude: 1.625 mV
Lead Channel Sensing Intrinsic Amplitude: 22.375 mV
Lead Channel Sensing Intrinsic Amplitude: 22.375 mV
Lead Channel Setting Pacing Amplitude: 2 V
Lead Channel Setting Pacing Amplitude: 2.25 V
Lead Channel Setting Pacing Amplitude: 2.5 V
Lead Channel Setting Pacing Pulse Width: 0.4 ms
Lead Channel Setting Pacing Pulse Width: 0.8 ms
Lead Channel Setting Sensing Sensitivity: 0.45 mV

## 2021-06-01 ENCOUNTER — Encounter: Payer: Self-pay | Admitting: Internal Medicine

## 2021-06-01 ENCOUNTER — Ambulatory Visit (INDEPENDENT_AMBULATORY_CARE_PROVIDER_SITE_OTHER): Payer: No Typology Code available for payment source | Admitting: Internal Medicine

## 2021-06-01 VITALS — BP 120/76 | HR 84 | Ht 63.0 in | Wt 131.0 lb

## 2021-06-01 DIAGNOSIS — I472 Ventricular tachycardia, unspecified: Secondary | ICD-10-CM | POA: Diagnosis not present

## 2021-06-01 DIAGNOSIS — I25119 Atherosclerotic heart disease of native coronary artery with unspecified angina pectoris: Secondary | ICD-10-CM | POA: Diagnosis not present

## 2021-06-01 DIAGNOSIS — I10 Essential (primary) hypertension: Secondary | ICD-10-CM | POA: Diagnosis not present

## 2021-06-01 DIAGNOSIS — I5042 Chronic combined systolic (congestive) and diastolic (congestive) heart failure: Secondary | ICD-10-CM | POA: Diagnosis not present

## 2021-06-01 NOTE — Patient Instructions (Signed)
Medication Instructions:  Continue all current medications.  Labwork: none  Testing/Procedures: none  Follow-Up: 1 year - Dr.  Allred   Any Other Special Instructions Will Be Listed Below (If Applicable).   If you need a refill on your cardiac medications before your next appointment, please call your pharmacy.  

## 2021-06-01 NOTE — Progress Notes (Signed)
PCP: Ignatius Specking, MD Primary Cardiologist: Dr Diona Browner Primary EP: Dr Avie Arenas is a 76 y.o. female who presents today for routine electrophysiology followup.  Since last being seen in our clinic, the patient reports doing very well.  Today, she denies symptoms of palpitations, chest pain, shortness of breath,  lower extremity edema, dizziness, presyncope, syncope, or ICD shocks.  The patient is otherwise without complaint today.   Past Medical History:  Diagnosis Date   Chronic systolic congestive heart failure (HCC)    Coronary artery disease    Anterior MI in 2010. LAD stent placement 2010, [place].   Essential hypertension    History of ventricular fibrillation 2013   Ischemic cardiomyopathy    LBBB (left bundle branch block)    MR (mitral regurgitation)    Past Surgical History:  Procedure Laterality Date   BI-VENTRICULAR IMPLANTABLE CARDIOVERTER DEFIBRILLATOR UPGRADE  11-16-2013   upgrade of previously implanted dual chamber ICD to MDT Ovidio Kin XT CRTD by Dr Johney Frame   BI-VENTRICULAR IMPLANTABLE CARDIOVERTER DEFIBRILLATOR UPGRADE N/A 11/16/2013   Procedure: BI-VENTRICULAR IMPLANTABLE CARDIOVERTER DEFIBRILLATOR UPGRADE;  Surgeon: Gardiner Rhyme, MD;  Location: [facility] CATH LAB;  Service: Cardiovascular;  Laterality: N/A;   BIV ICD GENERATOR CHANGEOUT N/A 02/24/2020   Procedure: BIV ICD GENERATOR CHANGEOUT;  Surgeon: Hillis Range, MD;  Location: [facility] INVASIVE CV LAB;  Service: Cardiovascular;  Laterality: N/A;   CARDIAC CATHETERIZATION  2010   LAD PCI and stent placement   CARDIAC DEFIBRILLATOR PLACEMENT  06/14/09   MDT [facility] Johney Frame   TONSILLECTOMY AND ADENOIDECTOMY     TUBAL LIGATION      ROS- all systems are reviewed and negative except as per HPI above  Current Outpatient Medications  Medication Sig Dispense Refill   acetaminophen (TYLENOL) 500 MG tablet Take 1,000 mg by mouth every 6 (six) hours as needed for moderate pain.      aspirin 81 MG tablet Take 81  mg by mouth daily.     carvedilol (COREG) 3.125 MG tablet TAKE 1 TABLET TWICE DAILY (Patient taking differently: Take 3.125 mg by mouth in the morning and at bedtime.) 180 tablet 1   cetirizine (ZYRTEC) 10 MG tablet Take 10 mg by mouth daily as needed for allergies.      colchicine 0.6 MG tablet Take 0.6 mg by mouth daily as needed (gout).     empagliflozin (JARDIANCE) 10 MG TABS tablet Take 1 tablet (10 mg total) by mouth daily before breakfast. 90 tablet 3   ENTRESTO 24-26 MG TAKE 1 TABLET TWICE DAILY (Patient taking differently: Take 1 tablet by mouth 2 (two) times daily.) 180 tablet 1   furosemide (LASIX) 20 MG tablet Take 1 tablet (20 mg total) by mouth daily. 90 tablet 3   Naphazoline-Pheniramine (ALLERGY EYE OP) Place 1 drop into both eyes daily as needed (itchy eyes/allergies).     nitroGLYCERIN (NITROSTAT) 0.4 MG SL tablet DISSOLVE 1 TABLET UNDER THE TONGUE EVERY 5 MINUTES UP TO 3 DOSES AS NEEDED FOR CHEST PAIN. IF NO RELIEF CALL 911 OR GO TO ER (Patient taking differently: Place 0.4 mg under the tongue every 5 (five) minutes x 3 doses as needed for chest pain.) 25 tablet 3   pantoprazole (PROTONIX) 40 MG tablet Take 40 mg by mouth daily as needed (heartburn).     rosuvastatin (CRESTOR) 20 MG tablet TAKE 1 TABLET EVERY DAY (Patient taking differently: Take 20 mg by mouth at bedtime.) 90 tablet 1   spironolactone (ALDACTONE)  25 MG tablet TAKE 1 TABLET EVERY DAY (Patient taking differently: Take 25 mg by mouth at bedtime.) 90 tablet 1   triamcinolone cream (KENALOG) 0.5 % Apply 1 application topically 2 (two) times daily as needed (rash/irritation).     No current facility-administered medications for this visit.    Physical Exam: Vitals:   06/01/21 1031  BP: 120/76  Pulse: 84  Weight: 131 lb (59.4 kg)  Height: 5\' 3"  (1.6 m)    GEN- The patient is well appearing, alert and oriented x 3 today.   Head- normocephalic, atraumatic Eyes-  Sclera clear, conjunctiva pink Ears- hearing  intact Oropharynx- clear Lungs- Clear to ausculation bilaterally, normal work of breathing Chest- ICD pocket is well healed Heart- Regular rate and rhythm, no murmurs, rubs or gallops, PMI not laterally displaced GI- soft, NT, ND, + BS Extremities- no clubbing, cyanosis, or edema  ICD interrogation- reviewed in detail today,  See PACEART report    Wt Readings from Last 3 Encounters:  06/01/21 131 lb (59.4 kg)  05/22/21 130 lb (59 kg)  02/02/21 130 lb 12.8 oz (59.3 kg)    Assessment and Plan:  1.  Chronic systolic dysfunction/ CAD/ ischemic CM/ prior VF euvolemic today Stable on an appropriate medical regimen Normal ICD function See P[facility] report No changes today she is not device dependant today followed in ICM device clinic  2. HTN Stable No change required today   Risks, benefits and potential toxicities for medications prescribed and/or refilled reviewed with patient today.   Return in a year  04/04/21 MD, [facility] 06/01/2021 11:01 AM

## 2021-06-04 ENCOUNTER — Ambulatory Visit (INDEPENDENT_AMBULATORY_CARE_PROVIDER_SITE_OTHER): Payer: No Typology Code available for payment source

## 2021-06-04 DIAGNOSIS — I5042 Chronic combined systolic (congestive) and diastolic (congestive) heart failure: Secondary | ICD-10-CM

## 2021-06-04 DIAGNOSIS — Z95810 Presence of automatic (implantable) cardiac defibrillator: Secondary | ICD-10-CM

## 2021-06-07 NOTE — Progress Notes (Signed)
Remote ICD transmission.   

## 2021-06-08 NOTE — Progress Notes (Signed)
EPIC Encounter for ICM Monitoring  Patient Name: Tracy Mills is a 76 y.o. female Date: 06/08/2021 Primary Care Physican: Ignatius Specking, MD Primary Cardiologist: Diona Browner Electrophysiologist: Allred Bi-V Pacing:  97.6%    06/08/2021 Weight: 131 lbs   Time in AT/AF  0.0 hr/day (0.0%)         Spoke with patient and heart failure questions reviewed.  Pt asymptomatic for fluid accumulation.  Reports feeling well at this time and voices no complaints.          Optivol Thoracic impedance suggesting normal fluid levels.   Prescribed: Furosemide 20 mg take 1 tablet daily    Labs: 02/21/2020 Creatinine 1.23, BUN 22, Potassium 4.2, Sodium 138, GFR 44-50 09/28/2019 Creatinine 1.36, BUN 18, Potassium 4.5, Sodium 139, GFR 39-45 A complete set of results can be found in Results Review.   Recommendations:  No changes and encouraged to call if experiencing any fluid symptoms.   Follow-up plan: ICM clinic phone appointment on 07/09/2021.   91 day device clinic remote transmission 08/28/2021.     EP/Cardiology Office Visits:.  08/10/2021 with Diona Browner.     Copy of ICM check sent to Dr. Johney Frame.   3 month ICM trend: 06/04/2021.    12-14 Month ICM trend:     [name], RN 06/08/2021 4:27 PM

## 2021-06-11 ENCOUNTER — Telehealth: Payer: Self-pay

## 2021-06-11 DIAGNOSIS — E785 Hyperlipidemia, unspecified: Secondary | ICD-10-CM

## 2021-06-11 DIAGNOSIS — I5022 Chronic systolic (congestive) heart failure: Secondary | ICD-10-CM

## 2021-06-11 MED ORDER — EMPAGLIFLOZIN 10 MG PO TABS: 10.0000 mg | ORAL_TABLET | Freq: Every day | ORAL | 3 refills | Status: DC

## 2021-06-11 MED ORDER — ROSUVASTATIN CALCIUM 20 MG PO TABS: 20.0000 mg | ORAL_TABLET | Freq: Every day | ORAL | 1 refills | Status: DC

## 2021-06-11 MED ORDER — ENTRESTO 24-26 MG PO TABS: 1.0000 | ORAL_TABLET | Freq: Two times a day (BID) | ORAL | 1 refills | Status: DC

## 2021-06-11 MED ORDER — SPIRONOLACTONE 25 MG PO TABS: ORAL_TABLET | ORAL | 1 refills | Status: DC

## 2021-06-11 MED ORDER — FUROSEMIDE 20 MG PO TABS: 20.0000 mg | ORAL_TABLET | Freq: Every day | ORAL | 3 refills | Status: DC

## 2021-06-11 MED ORDER — CARVEDILOL 3.125 MG PO TABS: ORAL_TABLET | ORAL | 1 refills | Status: DC

## 2021-06-11 NOTE — Telephone Encounter (Signed)
Medication refill request approved and sent to [facility] mail order service.

## 2021-07-09 ENCOUNTER — Ambulatory Visit (INDEPENDENT_AMBULATORY_CARE_PROVIDER_SITE_OTHER): Payer: No Typology Code available for payment source

## 2021-07-09 DIAGNOSIS — I5022 Chronic systolic (congestive) heart failure: Secondary | ICD-10-CM | POA: Diagnosis not present

## 2021-07-09 DIAGNOSIS — Z95810 Presence of automatic (implantable) cardiac defibrillator: Secondary | ICD-10-CM

## 2021-07-11 NOTE — Progress Notes (Signed)
EPIC Encounter for ICM Monitoring  Patient Name: Tracy Mills is a 76 y.o. female Date: 07/11/2021 Primary Care Physican: Ignatius Specking, MD Primary Cardiologist: Diona Browner Electrophysiologist: Allred Bi-V Pacing:  96.4%    06/08/2021 Weight: 131 lbs   Time in AT/AF  0.0 hr/day (0.0%)         Spoke with patient and heart failure questions reviewed.  Pt asymptomatic for fluid accumulation.  Reports feeling well at this time and voices no complaints.          Optivol Thoracic impedance suggesting normal fluid levels but suggested possible fluid accumulation intermittently from 1/12-2/4.   Prescribed: Furosemide 20 mg take 1 tablet daily    Labs: 02/21/2020 Creatinine 1.23, BUN 22, Potassium 4.2, Sodium 138, GFR 44-50 09/28/2019 Creatinine 1.36, BUN 18, Potassium 4.5, Sodium 139, GFR 39-45 A complete set of results can be found in Results Review.   Recommendations:  No changes and encouraged to call if experiencing any fluid symptoms.   Follow-up plan: ICM clinic phone appointment on 08/08/2021.   91 day device clinic remote transmission 08/28/2021.     EP/Cardiology Office Visits:.  08/10/2021 with Diona Browner.     Copy of ICM check sent to Dr. Johney Frame.   3 month ICM trend: 07/09/2021.    12-14 Month ICM trend:     [name], RN 07/11/2021 3:04 PM

## 2021-08-03 DIAGNOSIS — Z299 Encounter for prophylactic measures, unspecified: Secondary | ICD-10-CM | POA: Diagnosis not present

## 2021-08-03 DIAGNOSIS — L309 Dermatitis, unspecified: Secondary | ICD-10-CM | POA: Diagnosis not present

## 2021-08-03 DIAGNOSIS — E261 Secondary hyperaldosteronism: Secondary | ICD-10-CM | POA: Diagnosis not present

## 2021-08-03 DIAGNOSIS — I10 Essential (primary) hypertension: Secondary | ICD-10-CM | POA: Diagnosis not present

## 2021-08-03 DIAGNOSIS — Z789 Other specified health status: Secondary | ICD-10-CM | POA: Diagnosis not present

## 2021-08-03 DIAGNOSIS — I509 Heart failure, unspecified: Secondary | ICD-10-CM | POA: Diagnosis not present

## 2021-08-08 ENCOUNTER — Ambulatory Visit (INDEPENDENT_AMBULATORY_CARE_PROVIDER_SITE_OTHER): Payer: No Typology Code available for payment source

## 2021-08-08 ENCOUNTER — Telehealth: Payer: Self-pay

## 2021-08-08 DIAGNOSIS — I5022 Chronic systolic (congestive) heart failure: Secondary | ICD-10-CM

## 2021-08-08 DIAGNOSIS — Z95810 Presence of automatic (implantable) cardiac defibrillator: Secondary | ICD-10-CM

## 2021-08-08 NOTE — Progress Notes (Signed)
EPIC Encounter for ICM Monitoring ? ?Patient Name: Tracy Mills is a 76 y.o. female ?Date: 08/08/2021 ?Primary Care Physican: Ignatius Specking, MD ?Primary Cardiologist: Diona Browner ?Electrophysiologist: Allred ?Bi-V Pacing:  96.5%    ?06/08/2021 Weight: 131 lbs ?08/08/2021 Weight: 131 lbs ?  ?Time in AT/AF  0.0 hr/day (0.0%) ?  ?      Spoke with patient and heart failure questions reviewed.  Pt asymptomatic for fluid accumulation.  Reports feeling well at this time and voices no complaints.  ?        ?Optivol Thoracic impedance suggesting possible fluid accumulation starting 3/8 but trending close to baseline. ?  ?Prescribed: ?Furosemide 20 mg take 1 tablet daily  ?  ?Labs: ?05/22/2021 Creatinine 1.17, BUN 15, Potassium 3.8, Sodium 138, GFR 49 ?A complete set of results can be found in Results Review. ?  ?Recommendations:  No changes and encouraged to call if experiencing any fluid symptoms.  Advised to limit salt intake.   ?  ?Follow-up plan: ICM clinic phone appointment on 09/10/2021.   91 day device clinic remote transmission 08/28/2021.   ?  ?EP/Cardiology Office Visits:.  08/10/2021 with Diona Browner.   ?  ?Copy of ICM check sent to Dr. Johney Frame.   ? ?3 month ICM trend: 08/08/2021. ? ? ? ?12-14 Month ICM trend:  ? ? ? ?[name], RN ?08/08/2021 ?1:03 PM ? ?

## 2021-08-08 NOTE — Telephone Encounter (Signed)
Spoke with patient and requested to send remote transmission for ICM monthly review of fluid levels.  She will send today.   ?

## 2021-08-09 NOTE — Progress Notes (Signed)
? ? ?Cardiology Office Note ? ?Date: 08/10/2021  ? ?ID: Tracy Mills, DOB [birth year], MRN [id number] ? ?PCP:  Ignatius Specking, MD  ?Cardiologist:  Nona Dell, MD ?Electrophysiologist:  Hillis Range, MD  ? ?Chief Complaint  ?Patient presents with  ? Cardiac follow-up  ? ? ?History of Present Illness: ?Tracy Mills is a 76 y.o. female last seen in September 2022.  She is here for a follow-up visit.  Reports NYHA class I-II dyspnea, no angina symptoms or nitroglycerin use. ? ?Medtronic biventricular ICD in place followed by Dr. Johney Frame.  She has had no palpitations or syncope, no device discharges. ? ?Echocardiogram in September 2022 revealed LVEF 25 to 30% range, mild to moderate mitral regurgitation as well.  We discussed getting an updated study.  Medications are noted below, she reports compliance with therapy.  Her weight has also been stable. ? ?Past Medical History:  ?Diagnosis Date  ? Chronic systolic congestive heart failure (HCC)   ? Coronary artery disease   ? Anterior MI in 2010. LAD stent placement 2010, [place].  ? Essential hypertension   ? History of ventricular fibrillation 2013  ? Ischemic cardiomyopathy   ? LBBB (left bundle branch block)   ? MR (mitral regurgitation)   ? ? ?Past Surgical History:  ?Procedure Laterality Date  ? BI-VENTRICULAR IMPLANTABLE CARDIOVERTER DEFIBRILLATOR UPGRADE  11-16-2013  ? upgrade of previously implanted dual chamber ICD to MDT Ovidio Kin XT CRTD by Dr Johney Frame  ? BI-VENTRICULAR IMPLANTABLE CARDIOVERTER DEFIBRILLATOR UPGRADE N/A 11/16/2013  ? Procedure: BI-VENTRICULAR IMPLANTABLE CARDIOVERTER DEFIBRILLATOR UPGRADE;  Surgeon: Gardiner Rhyme, MD;  Location: [facility] CATH LAB;  Service: Cardiovascular;  Laterality: N/A;  ? BIV ICD GENERATOR CHANGEOUT N/A 02/24/2020  ? Procedure: BIV ICD GENERATOR CHANGEOUT;  Surgeon: Hillis Range, MD;  Location: [facility] INVASIVE CV LAB;  Service: Cardiovascular;  Laterality: N/A;  ? CARDIAC CATHETERIZATION  2010  ? LAD PCI and stent placement  ?  CARDIAC DEFIBRILLATOR PLACEMENT  06/14/09  ? MDT [place] Dr Johney Frame  ? TONSILLECTOMY AND ADENOIDECTOMY    ? TUBAL LIGATION    ? ? ?Current Outpatient Medications  ?Medication Sig Dispense Refill  ? acetaminophen (TYLENOL) 500 MG tablet Take 1,000 mg by mouth every 6 (six) hours as needed for moderate pain.     ? aspirin 81 MG tablet Take 81 mg by mouth daily.    ? carvedilol (COREG) 3.125 MG tablet TAKE 1 TABLET TWICE DAILY 180 tablet 1  ? cetirizine (ZYRTEC) 10 MG tablet Take 10 mg by mouth daily as needed for allergies.     ? colchicine 0.6 MG tablet Take 0.6 mg by mouth daily as needed (gout).    ? empagliflozin (JARDIANCE) 10 MG TABS tablet Take 1 tablet (10 mg total) by mouth daily before breakfast. 90 tablet 3  ? furosemide (LASIX) 20 MG tablet Take 1 tablet (20 mg total) by mouth daily. 90 tablet 3  ? Naphazoline-Pheniramine (ALLERGY EYE OP) Place 1 drop into both eyes daily as needed (itchy eyes/allergies).    ? nitroGLYCERIN (NITROSTAT) 0.4 MG SL tablet DISSOLVE 1 TABLET UNDER THE TONGUE EVERY 5 MINUTES UP TO 3 DOSES AS NEEDED FOR CHEST PAIN. IF NO RELIEF CALL 911 OR GO TO ER (Patient taking differently: Place 0.4 mg under the tongue every 5 (five) minutes x 3 doses as needed for chest pain.) 25 tablet 3  ? pantoprazole (PROTONIX) 40 MG tablet Take 40 mg by mouth daily as needed (heartburn).    ? rosuvastatin (CRESTOR)  20 MG tablet Take 1 tablet (20 mg total) by mouth daily. 90 tablet 1  ? sacubitril-valsartan (ENTRESTO) 24-26 MG Take 1 tablet by mouth 2 (two) times daily. 180 tablet 1  ? spironolactone (ALDACTONE) 25 MG tablet TAKE 1 TABLET EVERY DAY 90 tablet 1  ? triamcinolone cream (KENALOG) 0.5 % Apply 1 application topically 2 (two) times daily as needed (rash/irritation).    ? ?No current facility-administered medications for this visit.  ? ?Allergies:  Penicillins, Sulfa antibiotics, and Tetracyclines & related  ? ?ROS: No orthopnea or PND.  No leg swelling. ? ?Physical Exam: ?VS:  BP 118/60   Pulse  64   Ht 5\' 3"  (1.6 m)   Wt 132 lb 9.6 oz (60.1 kg)   BMI 23.49 kg/m? , BMI Body mass index is 23.49 kg/m?. ? ?Wt Readings from Last 3 Encounters:  ?08/10/21 132 lb 9.6 oz (60.1 kg)  ?06/01/21 131 lb (59.4 kg)  ?05/22/21 130 lb (59 kg)  ?  ?General: Patient appears comfortable at rest. ?HEENT: Conjunctiva and lids normal, wearing a mask. ?Neck: Supple, no elevated JVP or carotid bruits, no thyromegaly. ?Lungs: Clear to auscultation, nonlabored breathing at rest. ?Cardiac: Regular rate and rhythm, no S3, 2/6 systolic murmur, no pericardial rub. ?Extremities: No pitting edema. ? ?ECG:  An ECG dated 05/22/2021 was personally reviewed today and demonstrated:  Atrial paced rhythm with PVC. ? ?Recent Labwork: ?05/22/2021: ALT 10; AST 16; BUN 15; Creatinine, Ser 1.17; Hemoglobin 12.3; Magnesium 2.1; Platelets 300; Potassium 3.8; Sodium 138  ? ?Other Studies Reviewed Today: ? ?Echocardiogram 01/25/2021: ? 1. The mid to distal anterior, anterlolateral, anteroseptal walls are  ?akinetic. The apex is akinetic. 03/27/2021 Left ventricular ejection fraction, by  ?estimation, is 25 to 30%. The left ventricle has severely decreased  ?function. The left ventricle demonstrates  ?regional wall motion abnormalities (see scoring diagram/findings for  ?description). Left ventricular diastolic parameters are consistent with  ?Grade I diastolic dysfunction (impaired relaxation). The average left  ?ventricular global longitudinal strain is  ?-8.7 %. The global longitudinal strain is abnormal.  ? 2. Right ventricular systolic function is normal. The right ventricular  ?size is normal. Tricuspid regurgitation signal is inadequate for assessing  ?PA pressure.  ? 3. The mitral valve is abnormal. Mild to moderate mitral valve  ?regurgitation. No evidence of mitral stenosis.  ? 4. The aortic valve is tricuspid. There is mild calcification of the  ?aortic valve. There is mild thickening of the aortic valve. Aortic valve  ?regurgitation is not visualized.  No aortic stenosis is present.  ? 5. The visualized portion of the abdominal aorta appears mildly dilated,  ?diffuse plaque.  ? ?Assessment and Plan: ? ?1.  HFrEF with ischemic cardiomyopathy and LVEF 25 to 30% range by last assessment.  She is doing well with NYHA class I-II dyspnea and stable weight.  Continue Coreg, Entresto, Aldactone, Lasix, and Jardiance.  We will obtain a follow-up echocardiogram for reevaluation. ? ?2.  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 Marland Kitchen.  No device shocks or syncope. ? ?3.  CAD status post anterior wall infarct in 2010 treated with stent intervention to the LAD.  She does not report any angina or nitroglycerin use.  Continue aspirin and Crestor. ? ?Medication Adjustments/Labs and Tests Ordered: ?Current medicines are reviewed at length with the patient today.  Concerns regarding medicines are outlined above.  ? ?Tests Ordered: ?Orders Placed This Encounter  ?Procedures  ? ECHOCARDIOGRAM COMPLETE  ? ? ?Medication Changes: ?No  orders of the defined types were placed in this encounter. ? ? ?Disposition:  Follow up  6 months. ? ?Signed, ?[name], MD, [facility] ?08/10/2021 11:53 AM    ?[facility] ?[address] [place], [place], [place] [address] ?Phone: [phone]; Fax: [phone]  ?

## 2021-08-10 ENCOUNTER — Encounter: Payer: Self-pay | Admitting: Cardiology

## 2021-08-10 ENCOUNTER — Ambulatory Visit (INDEPENDENT_AMBULATORY_CARE_PROVIDER_SITE_OTHER): Payer: No Typology Code available for payment source | Admitting: Cardiology

## 2021-08-10 VITALS — BP 118/60 | HR 64 | Ht 63.0 in | Wt 132.6 lb

## 2021-08-10 DIAGNOSIS — I5020 Unspecified systolic (congestive) heart failure: Secondary | ICD-10-CM | POA: Diagnosis not present

## 2021-08-10 DIAGNOSIS — I25119 Atherosclerotic heart disease of native coronary artery with unspecified angina pectoris: Secondary | ICD-10-CM

## 2021-08-10 DIAGNOSIS — I428 Other cardiomyopathies: Secondary | ICD-10-CM

## 2021-08-10 DIAGNOSIS — Z95810 Presence of automatic (implantable) cardiac defibrillator: Secondary | ICD-10-CM

## 2021-08-10 NOTE — Patient Instructions (Addendum)

## 2021-08-23 DIAGNOSIS — I10 Essential (primary) hypertension: Secondary | ICD-10-CM | POA: Diagnosis not present

## 2021-08-23 DIAGNOSIS — E1165 Type 2 diabetes mellitus with hyperglycemia: Secondary | ICD-10-CM | POA: Diagnosis not present

## 2021-08-23 DIAGNOSIS — E7800 Pure hypercholesterolemia, unspecified: Secondary | ICD-10-CM | POA: Diagnosis not present

## 2021-08-23 DIAGNOSIS — K219 Gastro-esophageal reflux disease without esophagitis: Secondary | ICD-10-CM | POA: Diagnosis not present

## 2021-08-28 ENCOUNTER — Ambulatory Visit (INDEPENDENT_AMBULATORY_CARE_PROVIDER_SITE_OTHER): Payer: No Typology Code available for payment source

## 2021-08-28 DIAGNOSIS — I428 Other cardiomyopathies: Secondary | ICD-10-CM

## 2021-08-28 DIAGNOSIS — I5020 Unspecified systolic (congestive) heart failure: Secondary | ICD-10-CM

## 2021-08-29 LAB — CUP PACEART REMOTE DEVICE CHECK
Battery Remaining Longevity: 82 mo
Battery Voltage: 2.99 V
Brady Statistic AP VP Percent: 64.13 %
Brady Statistic AP VS Percent: 1.37 %
Brady Statistic AS VP Percent: 33.99 %
Brady Statistic AS VS Percent: 0.5 %
Brady Statistic RA Percent Paced: 65.43 %
Brady Statistic RV Percent Paced: 8.35 %
Date Time Interrogation Session: 20230404092752
HighPow Impedance: 43 Ohm
HighPow Impedance: 54 Ohm
Implantable Lead Implant Date: 20110119
Implantable Lead Implant Date: 20110119
Implantable Lead Implant Date: 20150623
Implantable Lead Location: 753858
Implantable Lead Location: 753859
Implantable Lead Location: 753860
Implantable Lead Model: 4598
Implantable Lead Model: 5076
Implantable Lead Model: 6947
Implantable Pulse Generator Implant Date: 20210930
Lead Channel Impedance Value: 201.488
Lead Channel Impedance Value: 205.114
Lead Channel Impedance Value: 231.585
Lead Channel Impedance Value: 267.31 Ohm
Lead Channel Impedance Value: 273.729
Lead Channel Impedance Value: 342 Ohm
Lead Channel Impedance Value: 361 Ohm
Lead Channel Impedance Value: 399 Ohm
Lead Channel Impedance Value: 456 Ohm
Lead Channel Impedance Value: 456 Ohm
Lead Channel Impedance Value: 475 Ohm
Lead Channel Impedance Value: 646 Ohm
Lead Channel Impedance Value: 646 Ohm
Lead Channel Impedance Value: 665 Ohm
Lead Channel Impedance Value: 722 Ohm
Lead Channel Impedance Value: 950 Ohm
Lead Channel Impedance Value: 988 Ohm
Lead Channel Impedance Value: 988 Ohm
Lead Channel Pacing Threshold Amplitude: 0.5 V
Lead Channel Pacing Threshold Amplitude: 1 V
Lead Channel Pacing Threshold Amplitude: 1.25 V
Lead Channel Pacing Threshold Pulse Width: 0.4 ms
Lead Channel Pacing Threshold Pulse Width: 0.4 ms
Lead Channel Pacing Threshold Pulse Width: 0.8 ms
Lead Channel Sensing Intrinsic Amplitude: 1.5 mV
Lead Channel Sensing Intrinsic Amplitude: 1.5 mV
Lead Channel Sensing Intrinsic Amplitude: 25.875 mV
Lead Channel Sensing Intrinsic Amplitude: 25.875 mV
Lead Channel Setting Pacing Amplitude: 2 V
Lead Channel Setting Pacing Amplitude: 2.25 V
Lead Channel Setting Pacing Amplitude: 2.5 V
Lead Channel Setting Pacing Pulse Width: 0.4 ms
Lead Channel Setting Pacing Pulse Width: 0.8 ms
Lead Channel Setting Sensing Sensitivity: 0.45 mV

## 2021-09-06 ENCOUNTER — Ambulatory Visit (INDEPENDENT_AMBULATORY_CARE_PROVIDER_SITE_OTHER): Payer: No Typology Code available for payment source

## 2021-09-06 DIAGNOSIS — I428 Other cardiomyopathies: Secondary | ICD-10-CM | POA: Diagnosis not present

## 2021-09-06 LAB — ECHOCARDIOGRAM COMPLETE
AR max vel: 3.02 cm2
AV Area VTI: 2.69 cm2
AV Area mean vel: 2.99 cm2
AV Mean grad: 4 mmHg
AV Peak grad: 7.2 mmHg
Ao pk vel: 1.34 m/s
Area-P 1/2: 2.5 cm2
Calc EF: 30.7 %
MV M vel: 4.18 m/s
MV Peak grad: 69.9 mmHg
Radius: 0.37 cm
S' Lateral: 5.08 cm
Single Plane A2C EF: 34.9 %
Single Plane A4C EF: 28.2 %

## 2021-09-11 ENCOUNTER — Ambulatory Visit (INDEPENDENT_AMBULATORY_CARE_PROVIDER_SITE_OTHER): Payer: No Typology Code available for payment source

## 2021-09-11 ENCOUNTER — Telehealth: Payer: Self-pay

## 2021-09-11 DIAGNOSIS — Z95810 Presence of automatic (implantable) cardiac defibrillator: Secondary | ICD-10-CM

## 2021-09-11 DIAGNOSIS — I5022 Chronic systolic (congestive) heart failure: Secondary | ICD-10-CM | POA: Diagnosis not present

## 2021-09-11 NOTE — Progress Notes (Signed)
EPIC Encounter for ICM Monitoring ? ?Patient Name: Tracy Mills is a 76 y.o. female ?Date: 09/11/2021 ?Primary Care Physican: Ignatius Specking, MD ?Primary Cardiologist: Diona Browner ?Electrophysiologist: Allred ?Bi-V Pacing:  96.4%    ?06/08/2021 Weight: 131 lbs ?08/08/2021 Weight: 131 lbs ?  ?Time in AT/AF  0.0 hr/day (0.0%) ?  ?       Attempted call to patient and unable to reach, no voice mail set up.  Transmission reviewed.  ?        ?Optivol Thoracic impedance suggesting intermittent days with possible fluid accumulation within last month. ?  ?Prescribed: ?Furosemide 20 mg take 1 tablet daily  ?  ?Labs: ?05/22/2021 Creatinine 1.17, BUN 15, Potassium 3.8, Sodium 138, GFR 49 ?A complete set of results can be found in Results Review. ?  ?Recommendations:  Unable to reach.   ?  ?Follow-up plan: ICM clinic phone appointment on 10/15/2021.   91 day device clinic remote transmission 11/28/2021.   ?  ?EP/Cardiology Office Visits:.  02/13/2022 with Diona Browner.   ?  ?Copy of ICM check sent to Dr. Johney Frame.   ? ?3 month ICM trend: 09/10/2021. ? ? ? ?12-14 Month ICM trend:  ? ? ? ?[name], RN ?09/11/2021 ?4:13 PM ? ?

## 2021-09-11 NOTE — Telephone Encounter (Signed)
Remote ICM transmission received.  Attempted call to patient regarding ICM remote transmission and unable to leave message due to no voice mail set up.  ?

## 2021-09-12 DIAGNOSIS — Z6823 Body mass index (BMI) 23.0-23.9, adult: Secondary | ICD-10-CM | POA: Diagnosis not present

## 2021-09-12 DIAGNOSIS — Z0000 Encounter for general adult medical examination without abnormal findings: Secondary | ICD-10-CM | POA: Diagnosis not present

## 2021-09-12 DIAGNOSIS — Z1331 Encounter for screening for depression: Secondary | ICD-10-CM | POA: Diagnosis not present

## 2021-09-12 DIAGNOSIS — I428 Other cardiomyopathies: Secondary | ICD-10-CM | POA: Diagnosis not present

## 2021-09-12 DIAGNOSIS — I10 Essential (primary) hypertension: Secondary | ICD-10-CM | POA: Diagnosis not present

## 2021-09-12 DIAGNOSIS — I509 Heart failure, unspecified: Secondary | ICD-10-CM | POA: Diagnosis not present

## 2021-09-12 DIAGNOSIS — Z7189 Other specified counseling: Secondary | ICD-10-CM | POA: Diagnosis not present

## 2021-09-12 DIAGNOSIS — Z299 Encounter for prophylactic measures, unspecified: Secondary | ICD-10-CM | POA: Diagnosis not present

## 2021-09-12 DIAGNOSIS — Z1339 Encounter for screening examination for other mental health and behavioral disorders: Secondary | ICD-10-CM | POA: Diagnosis not present

## 2021-09-13 NOTE — Progress Notes (Signed)
Remote ICD transmission.   

## 2021-10-11 DIAGNOSIS — R5383 Other fatigue: Secondary | ICD-10-CM | POA: Diagnosis not present

## 2021-10-11 DIAGNOSIS — Z6823 Body mass index (BMI) 23.0-23.9, adult: Secondary | ICD-10-CM | POA: Diagnosis not present

## 2021-10-11 DIAGNOSIS — F17210 Nicotine dependence, cigarettes, uncomplicated: Secondary | ICD-10-CM | POA: Diagnosis not present

## 2021-10-11 DIAGNOSIS — Z0000 Encounter for general adult medical examination without abnormal findings: Secondary | ICD-10-CM | POA: Diagnosis not present

## 2021-10-11 DIAGNOSIS — Z299 Encounter for prophylactic measures, unspecified: Secondary | ICD-10-CM | POA: Diagnosis not present

## 2021-10-11 DIAGNOSIS — E7800 Pure hypercholesterolemia, unspecified: Secondary | ICD-10-CM | POA: Diagnosis not present

## 2021-10-11 DIAGNOSIS — Z79899 Other long term (current) drug therapy: Secondary | ICD-10-CM | POA: Diagnosis not present

## 2021-10-11 DIAGNOSIS — I10 Essential (primary) hypertension: Secondary | ICD-10-CM | POA: Diagnosis not present

## 2021-10-15 ENCOUNTER — Ambulatory Visit (INDEPENDENT_AMBULATORY_CARE_PROVIDER_SITE_OTHER): Payer: No Typology Code available for payment source

## 2021-10-15 DIAGNOSIS — Z95810 Presence of automatic (implantable) cardiac defibrillator: Secondary | ICD-10-CM

## 2021-10-15 DIAGNOSIS — I5022 Chronic systolic (congestive) heart failure: Secondary | ICD-10-CM

## 2021-10-17 DIAGNOSIS — Z79899 Other long term (current) drug therapy: Secondary | ICD-10-CM | POA: Diagnosis not present

## 2021-10-17 DIAGNOSIS — R5383 Other fatigue: Secondary | ICD-10-CM | POA: Diagnosis not present

## 2021-10-17 DIAGNOSIS — E7800 Pure hypercholesterolemia, unspecified: Secondary | ICD-10-CM | POA: Diagnosis not present

## 2021-10-18 NOTE — Progress Notes (Signed)
EPIC Encounter for ICM Monitoring  Patient Name: Tracy Mills is a 76 y.o. female Date: 10/18/2021 Primary Care Physican: Ignatius Specking, MD Primary Cardiologist: Diona Browner Electrophysiologist: Allred Bi-V Pacing:  97.2%    10/18/2021 Weight: 134 lbs   Time in AT/AF  0.0 hr/day (0.0%)          Spoke with patient and heart failure questions reviewed.  Pt asymptomatic for fluid accumulation.  Reports feeling well at this time and voices no complaints.          Optivol Thoracic impedance normal but was suggesting possible fluid accumulation from 5/15-5/18.   Prescribed: Furosemide 20 mg take 1 tablet daily    Labs: 05/22/2021 Creatinine 1.17, BUN 15, Potassium 3.8, Sodium 138, GFR 49 A complete set of results can be found in Results Review.   Recommendations:  No changes and encouraged to call if experiencing any fluid symptoms.   Follow-up plan: ICM clinic phone appointment on 11/19/2021.   91 day device clinic remote transmission 11/28/2021.     EP/Cardiology Office Visits:.  02/13/2022 with Diona Browner.     Copy of ICM check sent to Dr. Johney Frame.   3 month ICM trend: 10/15/2021.    12-14 Month ICM trend:     [name], RN 10/18/2021 10:40 AM

## 2021-10-23 ENCOUNTER — Other Ambulatory Visit (HOSPITAL_COMMUNITY): Payer: Self-pay | Admitting: Student

## 2021-10-23 ENCOUNTER — Other Ambulatory Visit: Payer: Self-pay | Admitting: Student

## 2021-10-23 DIAGNOSIS — F17210 Nicotine dependence, cigarettes, uncomplicated: Secondary | ICD-10-CM

## 2021-11-06 DIAGNOSIS — E2839 Other primary ovarian failure: Secondary | ICD-10-CM | POA: Diagnosis not present

## 2021-11-08 ENCOUNTER — Ambulatory Visit (HOSPITAL_COMMUNITY): Payer: No Typology Code available for payment source

## 2021-11-19 ENCOUNTER — Telehealth: Payer: Self-pay

## 2021-11-19 ENCOUNTER — Ambulatory Visit (INDEPENDENT_AMBULATORY_CARE_PROVIDER_SITE_OTHER): Payer: No Typology Code available for payment source

## 2021-11-19 DIAGNOSIS — I5022 Chronic systolic (congestive) heart failure: Secondary | ICD-10-CM | POA: Diagnosis not present

## 2021-11-19 DIAGNOSIS — Z95810 Presence of automatic (implantable) cardiac defibrillator: Secondary | ICD-10-CM

## 2021-11-19 NOTE — Telephone Encounter (Signed)
Remote ICM transmission received.  Attempted call to patient regarding ICM remote transmission and no voice mail set up. 

## 2021-11-19 NOTE — Progress Notes (Signed)
EPIC Encounter for ICM Monitoring  Patient Name: Tracy Mills is a 76 y.o. female Date: 11/19/2021 Primary Care Physican: Ignatius Specking, MD Primary Cardiologist: Diona Browner Electrophysiologist: Allred Bi-V Pacing:  94%    10/18/2021 Weight: 134 lbs   Time in AT/AF  0.0 hr/day (0.0%)          Attempted call to patient and unable to reach.   Transmission reviewed.          Optivol Thoracic impedance suggesting possible fluid accumulation from 5/31 and returned closed to normal 6/26.   Prescribed: Furosemide 20 mg take 1 tablet daily    Labs: 05/22/2021 Creatinine 1.17, BUN 15, Potassium 3.8, Sodium 138, GFR 49 A complete set of results can be found in Results Review.   Recommendations:  Unable to reach.     Follow-up plan: ICM clinic phone appointment on 11/28/2021 to recheck fluid levels.   91 day device clinic remote transmission 11/28/2021.     EP/Cardiology Office Visits:.  02/13/2022 with Diona Browner.     Copy of ICM check sent to Dr. Johney Frame.   3 month ICM trend: 11/19/2021.    12-14 Month ICM trend:     [name], RN 11/19/2021 3:22 PM

## 2021-11-28 ENCOUNTER — Other Ambulatory Visit: Payer: Self-pay | Admitting: *Deleted

## 2021-11-28 ENCOUNTER — Other Ambulatory Visit: Payer: Self-pay | Admitting: Cardiology

## 2021-11-28 ENCOUNTER — Ambulatory Visit (INDEPENDENT_AMBULATORY_CARE_PROVIDER_SITE_OTHER): Payer: No Typology Code available for payment source

## 2021-11-28 ENCOUNTER — Other Ambulatory Visit: Payer: Self-pay

## 2021-11-28 DIAGNOSIS — Z95810 Presence of automatic (implantable) cardiac defibrillator: Secondary | ICD-10-CM

## 2021-11-28 DIAGNOSIS — E785 Hyperlipidemia, unspecified: Secondary | ICD-10-CM

## 2021-11-28 DIAGNOSIS — I255 Ischemic cardiomyopathy: Secondary | ICD-10-CM | POA: Diagnosis not present

## 2021-11-28 MED ORDER — ENTRESTO 24-26 MG PO TABS: 1.0000 | ORAL_TABLET | Freq: Two times a day (BID) | ORAL | 1 refills | Status: DC

## 2021-11-28 MED ORDER — ROSUVASTATIN CALCIUM 20 MG PO TABS: 20.0000 mg | ORAL_TABLET | Freq: Every day | ORAL | 3 refills | Status: DC

## 2021-11-28 MED ORDER — CARVEDILOL 3.125 MG PO TABS: ORAL_TABLET | ORAL | 3 refills | Status: DC

## 2021-11-30 ENCOUNTER — Ambulatory Visit (INDEPENDENT_AMBULATORY_CARE_PROVIDER_SITE_OTHER): Payer: Self-pay

## 2021-11-30 DIAGNOSIS — Z95810 Presence of automatic (implantable) cardiac defibrillator: Secondary | ICD-10-CM

## 2021-11-30 DIAGNOSIS — I5022 Chronic systolic (congestive) heart failure: Secondary | ICD-10-CM

## 2021-11-30 NOTE — Progress Notes (Signed)
EPIC Encounter for ICM Monitoring  Patient Name: Tracy Mills is a 76 y.o. female Date: 11/30/2021 Primary Care Physican: Ignatius Specking, MD Primary Cardiologist: Diona Browner Electrophysiologist: Allred Bi-V Pacing:  93%    10/18/2021 Weight: 134 lbs   Time in AT/AF  <0.1 hr/day (<0.1%) Since 19-Nov-2021          Transmission reviewed.          Optivol Thoracic impedance suggesting fluid levels returned to normal.   Prescribed: Furosemide 20 mg take 1 tablet daily    Labs: 05/22/2021 Creatinine 1.17, BUN 15, Potassium 3.8, Sodium 138, GFR 49 A complete set of results can be found in Results Review.   Recommendations:  No Changes.    Follow-up plan: ICM clinic phone appointment on 12/31/2021.   91 day device clinic remote transmission 02/26/2022.     EP/Cardiology Office Visits:.  02/13/2022 with Diona Browner.     Copy of ICM check sent to Dr. Johney Frame.    3 month ICM trend: 11/28/2021.    12-14 Month ICM trend:     [name], RN 11/30/2021 8:14 AM

## 2021-12-01 LAB — CUP PACEART REMOTE DEVICE CHECK
Battery Remaining Longevity: 83 mo
Battery Voltage: 2.99 V
Brady Statistic AP VP Percent: 63.93 %
Brady Statistic AP VS Percent: 1.24 %
Brady Statistic AS VP Percent: 34.36 %
Brady Statistic AS VS Percent: 0.47 %
Brady Statistic RA Percent Paced: 65.01 %
Brady Statistic RV Percent Paced: 41.11 %
Date Time Interrogation Session: 20230705052826
HighPow Impedance: 50 Ohm
HighPow Impedance: 59 Ohm
Implantable Lead Implant Date: 20110119
Implantable Lead Implant Date: 20110119
Implantable Lead Implant Date: 20150623
Implantable Lead Location: 753858
Implantable Lead Location: 753859
Implantable Lead Location: 753860
Implantable Lead Model: 4598
Implantable Lead Model: 5076
Implantable Lead Model: 6947
Implantable Pulse Generator Implant Date: 20210930
Lead Channel Impedance Value: 1064 Ohm
Lead Channel Impedance Value: 1178 Ohm
Lead Channel Impedance Value: 1178 Ohm
Lead Channel Impedance Value: 237.865
Lead Channel Impedance Value: 240.906
Lead Channel Impedance Value: 256.983
Lead Channel Impedance Value: 324.377
Lead Channel Impedance Value: 330.057
Lead Channel Impedance Value: 342 Ohm
Lead Channel Impedance Value: 399 Ohm
Lead Channel Impedance Value: 456 Ohm
Lead Channel Impedance Value: 475 Ohm
Lead Channel Impedance Value: 589 Ohm
Lead Channel Impedance Value: 608 Ohm
Lead Channel Impedance Value: 722 Ohm
Lead Channel Impedance Value: 760 Ohm
Lead Channel Impedance Value: 874 Ohm
Lead Channel Impedance Value: 893 Ohm
Lead Channel Pacing Threshold Amplitude: 0.5 V
Lead Channel Pacing Threshold Amplitude: 1.125 V
Lead Channel Pacing Threshold Amplitude: 1.25 V
Lead Channel Pacing Threshold Pulse Width: 0.4 ms
Lead Channel Pacing Threshold Pulse Width: 0.4 ms
Lead Channel Pacing Threshold Pulse Width: 0.8 ms
Lead Channel Sensing Intrinsic Amplitude: 2.375 mV
Lead Channel Sensing Intrinsic Amplitude: 2.375 mV
Lead Channel Sensing Intrinsic Amplitude: 25.375 mV
Lead Channel Sensing Intrinsic Amplitude: 25.375 mV
Lead Channel Setting Pacing Amplitude: 2 V
Lead Channel Setting Pacing Amplitude: 2.25 V
Lead Channel Setting Pacing Amplitude: 2.5 V
Lead Channel Setting Pacing Pulse Width: 0.4 ms
Lead Channel Setting Pacing Pulse Width: 0.8 ms
Lead Channel Setting Sensing Sensitivity: 0.45 mV

## 2021-12-12 ENCOUNTER — Ambulatory Visit (HOSPITAL_COMMUNITY): Payer: No Typology Code available for payment source

## 2021-12-12 ENCOUNTER — Encounter (HOSPITAL_COMMUNITY): Payer: Self-pay

## 2021-12-16 ENCOUNTER — Telehealth: Payer: Self-pay | Admitting: Physician Assistant

## 2021-12-16 NOTE — Telephone Encounter (Signed)
Patient has a history of systolic heart failure, ischemic cardiomyopathy status post biventricular ICD.  ICD implantation in 2021.  Currently followed by Dr. Johney Frame and Dr. Diona Browner.  Patient called after our answering service complaining of 5-6 episodes of "buzz" coming out of from her device yesterday.  She did not feel bad.  She denies any chest pain, shortness of breath, dizziness, feeling of passing out or any significant palpitation.  She has any on remote transmission.  We were able to reach out to [employer] rep Kerby Moors, based on recent device interrogation, patient likely has 7 to 8 minutes of A-fib on 7/11, however that was 12 days ago.  There was no alert from yesterday.  The device did not deliver any therapy, there was no ventricular ectopy or any other irregular rhythm yesterday.  According to Shari Prows, the device is not supposed to make "buzz", if it does alert it would be more of a siren that a buzz.  I called the patient and updated her, we will continue to monitor at this point.

## 2021-12-20 NOTE — Progress Notes (Signed)
Remote ICD transmission.   

## 2021-12-26 ENCOUNTER — Telehealth: Payer: Self-pay

## 2021-12-26 NOTE — Telephone Encounter (Signed)
Patient stated that she is feeling well and due to send transmission in on Monday, patient denied hearing any more "buzzing" Sounds since 12/16/21

## 2021-12-26 NOTE — Telephone Encounter (Signed)
Return call to patient as requested by voice mail message.  She stated her device was making a buzzing sound and contacted after hours cardiology doctor on 7/23 (see phone note 7/23).  She is concerned about the buzzing and had the same problem in December 2022.  Advised the device clinic nurse can set up appointment and check her device in the office for any abnormalities and she agreed to schedule an appointment.    Routed to device clinic triage to contact patient to set up in office device check for buzzing sound.

## 2021-12-31 ENCOUNTER — Ambulatory Visit (INDEPENDENT_AMBULATORY_CARE_PROVIDER_SITE_OTHER): Payer: No Typology Code available for payment source

## 2021-12-31 DIAGNOSIS — Z95810 Presence of automatic (implantable) cardiac defibrillator: Secondary | ICD-10-CM

## 2021-12-31 DIAGNOSIS — I5022 Chronic systolic (congestive) heart failure: Secondary | ICD-10-CM

## 2022-01-01 ENCOUNTER — Telehealth: Payer: Self-pay

## 2022-01-01 NOTE — Telephone Encounter (Signed)
Remote ICM transmission received.  Attempted call to patient regarding ICM remote transmission and left message per DPR to return call.   

## 2022-01-01 NOTE — Progress Notes (Signed)
Spoke with patient and heart failure questions reviewed.  Pt asymptomatic for fluid accumulation.  Weight stable at 134 lbs.  Reports feeling well at this time and voices no complaints.  Transmission reviewed.  She had not had any further buzzing from device.  Advised if buzzing returns to call the office and make an appointment.  No changes and encouraged to call if experiencing any fluid symptoms.

## 2022-01-01 NOTE — Progress Notes (Signed)
EPIC Encounter for ICM Monitoring  Patient Name: Tracy Mills is a 76 y.o. female Date: 01/01/2022 Primary Care Physican: Ignatius Specking, MD Primary Cardiologist: Diona Browner Electrophysiologist: Allred Bi-V Pacing:  96.9%    10/18/2021 Weight: 134 lbs   Time in AT/AF  <0.1 hr/day (<0.1%) Since 19-Nov-2021          Attempted call to patient and unable to reach.  Left message to return call. Transmission reviewed.          Optivol Thoracic impedance suggesting possible fluid accumulation since 7/17 but trending back toward baseline.   Prescribed: Furosemide 20 mg take 1 tablet daily    Labs: 05/22/2021 Creatinine 1.17, BUN 15, Potassium 3.8, Sodium 138, GFR 49 A complete set of results can be found in Results Review.   Recommendations:  Unable to reach.     Follow-up plan: ICM clinic phone appointment on 02/04/2022.   91 day device clinic remote transmission 02/26/2022.     EP/Cardiology Office Visits:.  02/13/2022 with Diona Browner.     Copy of ICM check sent to Dr. Johney Frame.    3 month ICM trend: 12/31/2021.    12-14 Month ICM trend:     [name], RN 01/01/2022 10:09 AM

## 2022-02-04 ENCOUNTER — Ambulatory Visit (INDEPENDENT_AMBULATORY_CARE_PROVIDER_SITE_OTHER): Payer: No Typology Code available for payment source

## 2022-02-04 DIAGNOSIS — I5022 Chronic systolic (congestive) heart failure: Secondary | ICD-10-CM

## 2022-02-04 DIAGNOSIS — Z95810 Presence of automatic (implantable) cardiac defibrillator: Secondary | ICD-10-CM | POA: Diagnosis not present

## 2022-02-04 NOTE — Progress Notes (Signed)
EPIC Encounter for ICM Monitoring  Patient Name: Tracy Mills is a 76 y.o. female Date: 02/04/2022 Primary Care Physican: Ignatius Specking, MD Primary Cardiologist: Diona Browner Electrophysiologist: Mealor Bi-V Pacing:  94.9%    10/18/2021 Weight: 134 lbs 02/04/2022 Weight: 134 lbs   Since 31-Dec-2021 Time in AT/AF <0.1 hr/day (<0.1%) Longest AT/AF 64 seconds          Spoke with patient and heart failure questions reviewed.  Pt asymptomatic for fluid accumulation.  Reports feeling well at this time and voices no complaints.  She does not review the amount of salt in her foods.           Optivol Thoracic impedance suggesting possible fluid accumulation since 8/15.   Prescribed: Furosemide 20 mg take 1 tablet daily    Labs: 05/22/2021 Creatinine 1.17, BUN 15, Potassium 3.8, Sodium 138, GFR 49 A complete set of results can be found in Results Review.   Recommendations:  Advised to take extra Furosemide x 1 day and then return to prescribed dosage.  Also advised to limit salt intake to 2000 mg daily.    Follow-up plan: ICM clinic phone appointment on 02/11/2022 to recheck fluid levels.   91 day device clinic remote transmission 02/26/2022.     EP/Cardiology Office Visits:.  02/13/2022 with Diona Browner.     Copy of ICM check sent to Dr. [name] and Dr Diona Browner for review.    3 month ICM trend: 02/04/2022.    12-14 Month ICM trend:     [name], RN 02/04/2022 8:35 AM

## 2022-02-11 ENCOUNTER — Ambulatory Visit (INDEPENDENT_AMBULATORY_CARE_PROVIDER_SITE_OTHER): Payer: No Typology Code available for payment source

## 2022-02-11 DIAGNOSIS — I5022 Chronic systolic (congestive) heart failure: Secondary | ICD-10-CM

## 2022-02-11 DIAGNOSIS — Z95810 Presence of automatic (implantable) cardiac defibrillator: Secondary | ICD-10-CM

## 2022-02-12 NOTE — Progress Notes (Signed)
EPIC Encounter for ICM Monitoring  Patient Name: Tracy Mills is a 76 y.o. female Date: 02/12/2022 Primary Care Physican: [name], MD Primary Cardiologist: Domenic Polite Electrophysiologist: Mealor Bi-V Pacing:  95.5%    02/04/2022 Weight: 124 lbs 02/12/2022 Weight: 124.4 lbs   Clinical Status (04-Feb-2022 to 12-Feb-2022) Time in AT/AF   0.0 hr/day (0.0%)          Spoke with patient and heart failure questions reviewed.  Pt asymptomatic for fluid accumulation. She reports a cough she has had for the past year and will talk to Dr Domenic Polite about it at [place] office visit.          Optivol Thoracic impedance suggesting possible fluid accumulation since 8/15 with exception of 2 days at baseline 9/5 & 9/6.   Prescribed: Furosemide 20 mg take 1 tablet daily    Labs: 05/22/2021 Creatinine 1.17, BUN 15, Potassium 3.8, Sodium 138, GFR 49 A complete set of results can be found in Results Review.   Recommendations:  Advised any recommendations will be given at 9/20 OV.    Follow-up plan: ICM clinic phone appointment on 02/11/2022 to recheck fluid levels.   91 day device clinic remote transmission 02/26/2022.     EP/Cardiology Office Visits:.  02/13/2022 with Domenic Polite.     Copy of ICM check sent to Dr. Myles Gip and Dr Domenic Polite as Juluis Rainier for 9/20 OV.    3 month ICM trend: 02/12/2022.    12-14 Month ICM trend:     Rosalene Billings, RN 02/12/2022 2:59 PM

## 2022-02-13 ENCOUNTER — Encounter: Payer: Self-pay | Admitting: Cardiology

## 2022-02-13 ENCOUNTER — Ambulatory Visit: Payer: No Typology Code available for payment source | Attending: Cardiology | Admitting: Cardiology

## 2022-02-13 VITALS — BP 115/68 | HR 71 | Ht 63.0 in | Wt 125.2 lb

## 2022-02-13 DIAGNOSIS — I5020 Unspecified systolic (congestive) heart failure: Secondary | ICD-10-CM | POA: Diagnosis not present

## 2022-02-13 DIAGNOSIS — Z79899 Other long term (current) drug therapy: Secondary | ICD-10-CM | POA: Diagnosis not present

## 2022-02-13 DIAGNOSIS — I5022 Chronic systolic (congestive) heart failure: Secondary | ICD-10-CM

## 2022-02-13 DIAGNOSIS — I25119 Atherosclerotic heart disease of native coronary artery with unspecified angina pectoris: Secondary | ICD-10-CM

## 2022-02-13 NOTE — Progress Notes (Signed)
Cardiology Office Note  Date: 02/13/2022   ID: Tracy Mills, DOB Jul 28, 1945, MRN [id number]  PCP:  Ignatius Specking, MD  Cardiologist:  Nona Dell, MD Electrophysiologist:  Hillis Range, MD   Chief Complaint  Patient presents with   Cardiac follow-up    History of Present Illness: Tracy Mills is a 76 y.o. female last seen in March.  She is here for a follow-up visit.  Reports NYHA class II dyspnea at present.  Her weight is down about 7 pounds from March.  We went over her medications.  She stopped Jardiance thinking that it may have been leading to a chronic cough, but this did not change her symptoms.  She also increased her Lasix to 40 mg daily.  Otherwise no change in regimen.  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  Device check in July showed normal function.  She has had some interval fluid accumulation.  Echocardiogram from April revealed LVEF 25 to 30% with wall motion abnormalities consistent with ischemic cardiomyopathy, normal RV contraction, mild mitral regurgitation.  Past Medical History:  Diagnosis Date   Chronic systolic congestive heart failure (HCC)    Coronary artery disease    Anterior MI in 2010. LAD stent placement 2010, [place].   Essential hypertension    History of ventricular fibrillation 2013   Ischemic cardiomyopathy    LBBB (left bundle branch block)    MR (mitral regurgitation)     Past Surgical History:  Procedure Laterality Date   BI-VENTRICULAR IMPLANTABLE CARDIOVERTER DEFIBRILLATOR UPGRADE  11-16-2013   upgrade of previously implanted dual chamber ICD to MDT Ovidio Kin XT CRTD by Dr Johney Frame   BI-VENTRICULAR IMPLANTABLE CARDIOVERTER DEFIBRILLATOR UPGRADE N/A 11/16/2013   Procedure: BI-VENTRICULAR IMPLANTABLE CARDIOVERTER DEFIBRILLATOR UPGRADE;  Surgeon: Gardiner Rhyme, MD;  Location: [facility] CATH LAB;  Service: Cardiovascular;  Laterality: N/A;   BIV ICD GENERATOR CHANGEOUT N/A 02/24/2020   Procedure: BIV ICD  GENERATOR CHANGEOUT;  Surgeon: Hillis Range, MD;  Location: [facility] INVASIVE CV LAB;  Service: Cardiovascular;  Laterality: N/A;   CARDIAC CATHETERIZATION  2010   LAD PCI and stent placement   CARDIAC DEFIBRILLATOR PLACEMENT  06/14/09   MDT [facility] Johney Frame   TONSILLECTOMY AND ADENOIDECTOMY     TUBAL LIGATION      Current Outpatient Medications  Medication Sig Dispense Refill   acetaminophen (TYLENOL) 500 MG tablet Take 1,000 mg by mouth every 6 (six) hours as needed for moderate pain.      aspirin 81 MG tablet Take 81 mg by mouth daily.     carvedilol (COREG) 3.125 MG tablet TAKE 1 TABLET TWICE DAILY 180 tablet 3   cetirizine (ZYRTEC) 10 MG tablet Take 10 mg by mouth daily as needed for allergies.      colchicine 0.6 MG tablet Take 0.6 mg by mouth daily as needed (gout).     furosemide (LASIX) 20 MG tablet Take 1 tablet (20 mg total) by mouth daily. (Patient taking differently: Take 40 mg by mouth daily.) 90 tablet 3   Naphazoline-Pheniramine (ALLERGY EYE OP) Place 1 drop into both eyes daily as needed (itchy eyes/allergies).     nitroGLYCERIN (NITROSTAT) 0.4 MG SL tablet DISSOLVE 1 TABLET UNDER THE TONGUE EVERY 5 MINUTES UP TO 3 DOSES AS NEEDED FOR CHEST PAIN. IF NO RELIEF CALL 911 OR GO TO ER (Patient taking differently: Place 0.4 mg under the tongue every 5 (five) minutes x 3 doses as needed for chest pain.) 25 tablet 3  pantoprazole (PROTONIX) 40 MG tablet Take 40 mg by mouth daily as needed (heartburn).     rosuvastatin (CRESTOR) 20 MG tablet Take 1 tablet (20 mg total) by mouth daily. 90 tablet 3   sacubitril-valsartan (ENTRESTO) 24-26 MG Take 1 tablet by mouth 2 (two) times daily. 180 tablet 1   spironolactone (ALDACTONE) 25 MG tablet TAKE 1 TABLET DAILY 90 tablet 1   triamcinolone cream (KENALOG) 0.5 % Apply 1 application topically 2 (two) times daily as needed (rash/irritation).     empagliflozin (JARDIANCE) 10 MG TABS tablet Take 1 tablet (10 mg total) by mouth daily before breakfast.  (Patient not taking: Reported on 02/13/2022) 90 tablet 3   No current facility-administered medications for this visit.   Allergies:  Penicillins, Sulfa antibiotics, and Tetracyclines & related   ROS: No device shocks or syncope.  Physical Exam: VS:  BP 115/68   Pulse 71   Ht 5\' 3"  (1.6 m)   Wt 125 lb 3.2 oz (56.8 kg)   SpO2 98%   BMI 22.18 kg/m , BMI Body mass index is 22.18 kg/m.  Wt Readings from Last 3 Encounters:  02/13/22 125 lb 3.2 oz (56.8 kg)  08/10/21 132 lb 9.6 oz (60.1 kg)  06/01/21 131 lb (59.4 kg)    General: Patient appears comfortable at rest. HEENT: Conjunctiva and lids normal. Neck: Supple, no elevated JVP or carotid bruits, no thyromegaly. Lungs: Clear to auscultation, nonlabored breathing at rest. Cardiac: Regular rate and rhythm, no S3, 2/6 systolic murmur, no pericardial rub. Extremities: No pitting edema.  ECG:  An ECG dated 05/22/2021 was personally reviewed today and demonstrated:  Atrial paced rhythm with PVC.  Recent Labwork: 05/22/2021: ALT 10; AST 16; BUN 15; Creatinine, Ser 1.17; Hemoglobin 12.3; Magnesium 2.1; Platelets 300; Potassium 3.8; Sodium 138   Other Studies Reviewed Today:  Echocardiogram 09/06/2021:  1. Mid to distal anteroseptal, anterior, anteropateral,inferoseptal walls  are akinetic. The apex is akinetic. 09/08/2021 Left ventricular ejection fraction,  by estimation, is 25 to 30%. The left ventricle has severely decreased  function. The left ventricle  demonstrates regional wall motion abnormalities (see scoring  diagram/findings for description). The left ventricular internal cavity  size was mildly dilated. Left ventricular diastolic parameters are  consistent with Grade I diastolic dysfunction  (impaired relaxation). Elevated left atrial pressure. The average left  ventricular global longitudinal strain is -11.7 %. The global longitudinal  strain is abnormal.   2. Right ventricular systolic function is normal. The right ventricular   size is normal. There is normal pulmonary artery systolic pressure.   3. The mitral valve is abnormal. Mild mitral valve regurgitation. No  evidence of mitral stenosis.   4. The tricuspid valve is abnormal.   5. The aortic valve was not well visualized. Aortic valve regurgitation  is not visualized.   6. The inferior vena cava is normal in size with greater than 50%  respiratory variability, suggesting right atrial pressure of 3 mmHg.   Assessment and Plan:  1.  HFrEF with ischemic cardiomyopathy, LVEF 25 to 30% by most recent assessment and RV contraction normal.  She reports NYHA class II dyspnea.  Weight is down about 7 pounds.  She did stop Jardiance in the interim and uptitrated her Lasix.  Plan to check BMET and make medication adjustments from there.  She is otherwise on Coreg, Entresto, and Aldactone.  2.  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  3.  CAD status post anterior infarct in 2010 treated  with stent intervention of the LAD.  She does not describe any active angina and remains on aspirin along with Crestor.  Medication Adjustments/Labs and Tests Ordered: Current medicines are reviewed at length with the patient today.  Concerns regarding medicines are outlined above.   Tests Ordered: Orders Placed This Encounter  Procedures   Basic metabolic panel    Medication Changes: No orders of the defined types were placed in this encounter.   Disposition:  Follow up  6 months.  Signed, Satira Sark, MD, [facility] 02/13/2022 12:08 PM    [facility] [place], [place], [place] [address] Phone: [phone]; Fax: [phone]

## 2022-02-13 NOTE — Addendum Note (Signed)
Addended by: Merlene Laughter on: 02/13/2022 01:19 PM   Modules accepted: Orders

## 2022-02-13 NOTE — Patient Instructions (Signed)
Medication Instructions:  Your physician recommends that you continue on your current medications as directed. Please refer to the Current Medication list given to you today.   Labwork: BMET (asap @ [facility])  Testing/Procedures: none  Follow-Up:  Your physician recommends that you schedule a follow-up appointment in: 6 months  Any Other Special Instructions Will Be Listed Below (If Applicable).  If you need a refill on your cardiac medications before your next appointment, please call your pharmacy.

## 2022-02-18 ENCOUNTER — Ambulatory Visit (INDEPENDENT_AMBULATORY_CARE_PROVIDER_SITE_OTHER): Payer: No Typology Code available for payment source

## 2022-02-18 DIAGNOSIS — I5022 Chronic systolic (congestive) heart failure: Secondary | ICD-10-CM

## 2022-02-18 DIAGNOSIS — Z95810 Presence of automatic (implantable) cardiac defibrillator: Secondary | ICD-10-CM

## 2022-02-19 ENCOUNTER — Telehealth: Payer: Self-pay | Admitting: *Deleted

## 2022-02-19 ENCOUNTER — Telehealth: Payer: Self-pay

## 2022-02-19 DIAGNOSIS — I5022 Chronic systolic (congestive) heart failure: Secondary | ICD-10-CM

## 2022-02-19 MED ORDER — FUROSEMIDE 20 MG PO TABS: 20.0000 mg | ORAL_TABLET | Freq: Every day | ORAL | 3 refills | Status: DC

## 2022-02-19 MED ORDER — EMPAGLIFLOZIN 10 MG PO TABS: 10.0000 mg | ORAL_TABLET | Freq: Every day | ORAL | 6 refills | Status: DC

## 2022-02-19 NOTE — Telephone Encounter (Signed)
Patient informed and verbalized understanding of plan. Copy sent to PCP Says she has jardiance at home and did not need a new prescription.

## 2022-02-19 NOTE — Telephone Encounter (Signed)
-----   Message from Satira Sark, MD sent at 02/18/2022  4:26 PM EDT ----- Results reviewed.  Creatinine improved somewhat, down to 1.14 potassium normal.  See if she would be willing to resume Jardiance 10 mg daily.  Could then take Lasix 20 mg alternating with 40 mg every other day.

## 2022-02-19 NOTE — Progress Notes (Signed)
EPIC Encounter for ICM Monitoring  Patient Name: Tracy Mills is a 76 y.o. female Date: 02/19/2022 Primary Care Physican: [name], MD Primary Cardiologist: Domenic Polite Electrophysiologist: Mealor Bi-V Pacing:  96.8%    02/04/2022 Weight: 124 lbs 02/12/2022 Weight: 124.4 lbs 02/19/2022 Weight: 124 lbs   Clinical Status (12-Feb-2022 to 18-Feb-2022) Time in AT/AF   0.0 hr/day (0.0%)          Spoke with patient and heart failure questions reviewed.  Transmission results reviewed.  Pt reports she continues to have a cough especially at night and discussed with Dr Domenic Polite at 9/20 OV.          Optivol Thoracic impedance suggesting fluid levels trending back toward baseline but continues to suggest possible fluid accumulation since 8/15 with exception of 2 days at baseline 9/5 & 9/6.   Prescribed: Furosemide 20 mg taking 2 tablets (40 mg total) by mouth daily   Labs: 02/13/2022 Pending BMET 05/22/2021 Creatinine 1.17, BUN 15, Potassium 3.8, Sodium 138, GFR 49 A complete set of results can be found in Results Review.   Recommendations:  Advised to limit salt intake and to call if she experiences any fluid symptoms.   Follow-up plan: ICM clinic phone appointment on 02/26/2022 to recheck fluid levels.   91 day device clinic remote transmission 02/26/2022.     EP/Cardiology Office Visits:.  06/14/2022 with Dr Myles Gip.     Copy of ICM check sent to Dr. Myles Gip.  3 month ICM trend: 02/18/2022.    12-14 Month ICM trend:     Rosalene Billings, RN 02/19/2022 10:58 AM

## 2022-02-19 NOTE — Telephone Encounter (Signed)
Remote ICM transmission received.  Spoke with patient.   

## 2022-02-26 ENCOUNTER — Ambulatory Visit (INDEPENDENT_AMBULATORY_CARE_PROVIDER_SITE_OTHER): Payer: No Typology Code available for payment source

## 2022-02-26 ENCOUNTER — Ambulatory Visit (INDEPENDENT_AMBULATORY_CARE_PROVIDER_SITE_OTHER): Payer: Medicare HMO

## 2022-02-26 DIAGNOSIS — Z95810 Presence of automatic (implantable) cardiac defibrillator: Secondary | ICD-10-CM

## 2022-02-26 DIAGNOSIS — I255 Ischemic cardiomyopathy: Secondary | ICD-10-CM

## 2022-02-26 DIAGNOSIS — I5022 Chronic systolic (congestive) heart failure: Secondary | ICD-10-CM

## 2022-02-26 NOTE — Progress Notes (Signed)
EPIC Encounter for ICM Monitoring  Patient Name: Tracy Mills is a 76 y.o. female Date: 02/26/2022 Primary Care Physican: [name], MD Primary Cardiologist: Domenic Polite Electrophysiologist: Mealor Bi-V Pacing:  95.5%    02/04/2022 Weight: 124 lbs 02/12/2022 Weight: 124.4 lbs 02/19/2022 Weight: 124 lbs   Clinical Status (12-Feb-2022 to 18-Feb-2022) Time in AT/AF   0.0 hr/day (0.0%)          Attempted call to patient and unable to reach.  Left detailed message per DPR regarding transmission. Transmission reviewed.          Optivol Thoracic impedance suggesting fluid levels returned to baseline normal.   Prescribed: Furosemide 20 mg Take 1-2 tablets (20-40 mg total) by mouth daily. Alternated 20 mg and 40 mg every other day Spironolactone 25 mg take 1 tablet daily Jardiance 10 mg take 1 tablet by mouth daily before breakfast   Labs: 02/13/2022 Creatinine 1.14, BUN 7,   Potassium 4.0, Sodium 139, GFR 50 05/22/2021 Creatinine 1.17, BUN 15, Potassium 3.8, Sodium 138, GFR 49 A complete set of results can be found in Results Review.   Recommendations:  Left voice mail with ICM number and encouraged to call if experiencing any fluid symptoms.   Follow-up plan: ICM clinic phone appointment on 03/25/2022.   91 day device clinic remote transmission 02/26/2022.     EP/Cardiology Office Visits:.  06/14/2022 with Dr Myles Gip.     Copy of ICM check sent to Dr. Myles Gip.   3 month ICM trend: 02/26/2022.    12-14 Month ICM trend:     Rosalene Billings, RN 02/26/2022 3:22 PM

## 2022-03-01 LAB — CUP PACEART REMOTE DEVICE CHECK
Battery Remaining Longevity: 77 mo
Battery Voltage: 3 V
Brady Statistic AP VP Percent: 56.03 %
Brady Statistic AP VS Percent: 1.2 %
Brady Statistic AS VP Percent: 42.09 %
Brady Statistic AS VS Percent: 0.68 %
Brady Statistic RA Percent Paced: 56.19 %
Brady Statistic RV Percent Paced: 3.19 %
Date Time Interrogation Session: 20231003033423
HighPow Impedance: 47 Ohm
HighPow Impedance: 55 Ohm
Implantable Lead Implant Date: 20110119
Implantable Lead Implant Date: 20110119
Implantable Lead Implant Date: 20150623
Implantable Lead Location: 753858
Implantable Lead Location: 753859
Implantable Lead Location: 753860
Implantable Lead Model: 4598
Implantable Lead Model: 5076
Implantable Lead Model: 6947
Implantable Pulse Generator Implant Date: 20210930
Lead Channel Impedance Value: 1026 Ohm
Lead Channel Impedance Value: 1083 Ohm
Lead Channel Impedance Value: 205.114
Lead Channel Impedance Value: 211.891
Lead Channel Impedance Value: 231.585
Lead Channel Impedance Value: 273.729
Lead Channel Impedance Value: 285.934
Lead Channel Impedance Value: 342 Ohm
Lead Channel Impedance Value: 361 Ohm
Lead Channel Impedance Value: 399 Ohm
Lead Channel Impedance Value: 456 Ohm
Lead Channel Impedance Value: 475 Ohm
Lead Channel Impedance Value: 513 Ohm
Lead Channel Impedance Value: 646 Ohm
Lead Channel Impedance Value: 703 Ohm
Lead Channel Impedance Value: 760 Ohm
Lead Channel Impedance Value: 836 Ohm
Lead Channel Impedance Value: 950 Ohm
Lead Channel Pacing Threshold Amplitude: 0.5 V
Lead Channel Pacing Threshold Amplitude: 1.25 V
Lead Channel Pacing Threshold Amplitude: 1.375 V
Lead Channel Pacing Threshold Pulse Width: 0.4 ms
Lead Channel Pacing Threshold Pulse Width: 0.4 ms
Lead Channel Pacing Threshold Pulse Width: 0.8 ms
Lead Channel Sensing Intrinsic Amplitude: 1.625 mV
Lead Channel Sensing Intrinsic Amplitude: 1.625 mV
Lead Channel Sensing Intrinsic Amplitude: 22.75 mV
Lead Channel Sensing Intrinsic Amplitude: 22.75 mV
Lead Channel Setting Pacing Amplitude: 2 V
Lead Channel Setting Pacing Amplitude: 2.5 V
Lead Channel Setting Pacing Amplitude: 2.5 V
Lead Channel Setting Pacing Pulse Width: 0.4 ms
Lead Channel Setting Pacing Pulse Width: 0.8 ms
Lead Channel Setting Sensing Sensitivity: 0.45 mV

## 2022-03-11 NOTE — Progress Notes (Signed)
Remote ICD transmission.   

## 2022-03-27 NOTE — Progress Notes (Signed)
No ICM remote transmission received for 03/25/2022 and next ICM transmission scheduled for 04/29/2022.   

## 2022-04-01 ENCOUNTER — Telehealth: Payer: Self-pay | Admitting: Cardiology

## 2022-04-01 DIAGNOSIS — I5022 Chronic systolic (congestive) heart failure: Secondary | ICD-10-CM

## 2022-04-01 MED ORDER — FUROSEMIDE 20 MG PO TABS: 20.0000 mg | ORAL_TABLET | Freq: Every day | ORAL | 3 refills | Status: DC

## 2022-04-01 NOTE — Telephone Encounter (Signed)
*  STAT* If patient is at the pharmacy, call can be transferred to refill team.   1. Which medications need to be refilled? (please list name of each medication and dose if known)   furosemide (LASIX) 20 MG tablet    2. Which pharmacy/location (including street and city if local pharmacy) is medication to be sent to? [facility] [place] [place] [facility]   3. Do they need a 30 day or 90 day supply? [place]

## 2022-04-02 ENCOUNTER — Other Ambulatory Visit: Payer: Self-pay | Admitting: Cardiology

## 2022-04-02 DIAGNOSIS — I5022 Chronic systolic (congestive) heart failure: Secondary | ICD-10-CM

## 2022-04-05 ENCOUNTER — Telehealth: Payer: Self-pay | Admitting: Cardiology

## 2022-04-05 NOTE — Telephone Encounter (Signed)
I verified lasix dose is 20 mg alternating with 40 mg qod with [facility].

## 2022-04-05 NOTE — Telephone Encounter (Signed)
Pt c/o medication issue:  1. Name of Medication:  furosemide (LASIX) 20 MG tablet  2. How are you currently taking this medication (dosage and times per day)?   3. Are you having a reaction (difficulty breathing--STAT)?   4. What is your medication issue?   Please clarify instructions on prescription.   Phone: [phone] (reference#: [id number])

## 2022-04-29 ENCOUNTER — Ambulatory Visit (INDEPENDENT_AMBULATORY_CARE_PROVIDER_SITE_OTHER): Payer: No Typology Code available for payment source

## 2022-04-29 DIAGNOSIS — Z95810 Presence of automatic (implantable) cardiac defibrillator: Secondary | ICD-10-CM

## 2022-04-29 DIAGNOSIS — I5022 Chronic systolic (congestive) heart failure: Secondary | ICD-10-CM | POA: Diagnosis not present

## 2022-04-29 NOTE — Progress Notes (Unsigned)
EPIC Encounter for ICM Monitoring  Patient Name: Tracy Mills is a 76 y.o. female Date: 04/29/2022 Primary Care Physican: Ignatius Specking, MD Primary Cardiologist: Diona Browner Electrophysiologist: Mealor Bi-V Pacing:  95.6%    02/04/2022 Weight: 124 lbs 02/12/2022 Weight: 124.4 lbs 02/19/2022 Weight: 124 lbs 04/30/2022 Weight: 126 lbs   Clinical Status Since 26-Feb-2022 Time in AT/AF   0.0 hr/day (0.0%)         Spoke with patient and heart failure questions reviewed.  Transmission results reviewed.  Pt asymptomatic for fluid accumulation.  Reports feeling well at this time and voices no complaints.   She thinks the fluid accumulation may be due to eating holiday foods with more salt.           Optivol Thoracic impedance suggesting possible fluid accumulation starting 11/24 and trending back to baseline normal.   Prescribed: Furosemide 20 mg Take 1-2 tablets (20-40 mg total) by mouth daily. Alternated 20 mg and 40 mg every other day Spironolactone 25 mg take 1 tablet daily Jardiance 10 mg take 1 tablet by mouth daily before breakfast   Labs: 02/13/2022 Creatinine 1.14, BUN 7,   Potassium 4.0, Sodium 139, GFR 50 05/22/2021 Creatinine 1.17, BUN 15, Potassium 3.8, Sodium 138, GFR 49 A complete set of results can be found in Results Review.   Recommendations:  Recommendation to limit salt intake to 2000 mg daily.  Encouraged to call if experiencing any fluid symptoms.    Follow-up plan: ICM clinic phone appointment on 05/06/2022 to recheck fluid levels.   91 day device clinic remote transmission 05/28/2022.     EP/Cardiology Office Visits:.  06/14/2022 with Dr [name].     Copy of ICM check sent to Dr. [name].   3 month ICM trend: 04/29/2022.    12-14 Month ICM trend:     [name], RN 04/29/2022 12:08 PM

## 2022-05-06 ENCOUNTER — Ambulatory Visit (INDEPENDENT_AMBULATORY_CARE_PROVIDER_SITE_OTHER): Payer: No Typology Code available for payment source

## 2022-05-06 DIAGNOSIS — I5022 Chronic systolic (congestive) heart failure: Secondary | ICD-10-CM

## 2022-05-06 DIAGNOSIS — Z95810 Presence of automatic (implantable) cardiac defibrillator: Secondary | ICD-10-CM

## 2022-05-08 ENCOUNTER — Telehealth: Payer: Self-pay

## 2022-05-08 NOTE — Telephone Encounter (Signed)
Remote ICM transmission received.  Attempted call to patient regarding ICM remote transmission and left detailed message per DPR.  Advised to return call for any fluid symptoms or questions. Next ICM remote transmission scheduled 06/17/2022.    

## 2022-05-08 NOTE — Progress Notes (Signed)
EPIC Encounter for ICM Monitoring  Patient Name: Tracy Mills is a 76 y.o. female Date: 05/08/2022 Primary Care Physican: Ignatius Specking, MD Primary Cardiologist: Diona Browner Electrophysiologist: Mealor Bi-V Pacing:  96.7%    02/04/2022 Weight: 124 lbs 02/12/2022 Weight: 124.4 lbs 02/19/2022 Weight: 124 lbs 04/30/2022 Weight: 126 lbs   Clinical Status (29-Apr-2022 to 06-May-2022) Time in AT/AF   0.0 hr/day (0.0%)         Attempted call to patient and unable to reach.  Left detailed message per DPR regarding transmission. Transmission reviewed.          Optivol Thoracic impedance suggesting fluid levels returned to baseline.   Prescribed: Furosemide 20 mg Take 1-2 tablets (20-40 mg total) by mouth daily. Alternated 20 mg and 40 mg every other day Spironolactone 25 mg take 1 tablet daily Jardiance 10 mg take 1 tablet by mouth daily before breakfast   Labs: 02/13/2022 Creatinine 1.14, BUN 7,   Potassium 4.0, Sodium 139, GFR 50 05/22/2021 Creatinine 1.17, BUN 15, Potassium 3.8, Sodium 138, GFR 49 A complete set of results can be found in Results Review.   Recommendations:  Left voice mail with ICM number and encouraged to call if experiencing any fluid symptoms.    Follow-up plan: ICM clinic phone appointment on 06/17/2022.   91 day device clinic remote transmission 05/28/2022.     EP/Cardiology Office Visits:.  06/14/2022 with Dr [name].     Copy of ICM check sent to Dr. [name].   3 month ICM trend: 05/06/2022.    12-14 Month ICM trend:     [name], RN 05/08/2022 7:59 AM

## 2022-05-28 ENCOUNTER — Ambulatory Visit (INDEPENDENT_AMBULATORY_CARE_PROVIDER_SITE_OTHER): Payer: No Typology Code available for payment source

## 2022-05-28 DIAGNOSIS — I5022 Chronic systolic (congestive) heart failure: Secondary | ICD-10-CM | POA: Diagnosis not present

## 2022-05-28 DIAGNOSIS — I255 Ischemic cardiomyopathy: Secondary | ICD-10-CM

## 2022-05-28 LAB — CUP PACEART REMOTE DEVICE CHECK
Battery Remaining Longevity: 72 mo
Battery Voltage: 2.99 V
Brady Statistic AP VP Percent: 53.58 %
Brady Statistic AP VS Percent: 1.09 %
Brady Statistic AS VP Percent: 44.69 %
Brady Statistic AS VS Percent: 0.64 %
Brady Statistic RA Percent Paced: 54.52 %
Brady Statistic RV Percent Paced: 4.37 %
Date Time Interrogation Session: 20240102033422
HighPow Impedance: 43 Ohm
HighPow Impedance: 53 Ohm
Implantable Lead Connection Status: 753985
Implantable Lead Connection Status: 753985
Implantable Lead Connection Status: 753985
Implantable Lead Implant Date: 20110119
Implantable Lead Implant Date: 20110119
Implantable Lead Implant Date: 20150623
Implantable Lead Location: 753858
Implantable Lead Location: 753859
Implantable Lead Location: 753860
Implantable Lead Model: 4598
Implantable Lead Model: 5076
Implantable Lead Model: 6947
Implantable Pulse Generator Implant Date: 20210930
Lead Channel Impedance Value: 1026 Ohm
Lead Channel Impedance Value: 211.891
Lead Channel Impedance Value: 215.064
Lead Channel Impedance Value: 226.51 Ohm
Lead Channel Impedance Value: 278.237
Lead Channel Impedance Value: 283.733
Lead Channel Impedance Value: 304 Ohm
Lead Channel Impedance Value: 361 Ohm
Lead Channel Impedance Value: 399 Ohm
Lead Channel Impedance Value: 418 Ohm
Lead Channel Impedance Value: 513 Ohm
Lead Channel Impedance Value: 532 Ohm
Lead Channel Impedance Value: 608 Ohm
Lead Channel Impedance Value: 722 Ohm
Lead Channel Impedance Value: 760 Ohm
Lead Channel Impedance Value: 779 Ohm
Lead Channel Impedance Value: 893 Ohm
Lead Channel Impedance Value: 988 Ohm
Lead Channel Pacing Threshold Amplitude: 0.5 V
Lead Channel Pacing Threshold Amplitude: 1.125 V
Lead Channel Pacing Threshold Amplitude: 1.375 V
Lead Channel Pacing Threshold Pulse Width: 0.4 ms
Lead Channel Pacing Threshold Pulse Width: 0.4 ms
Lead Channel Pacing Threshold Pulse Width: 0.8 ms
Lead Channel Sensing Intrinsic Amplitude: 0.875 mV
Lead Channel Sensing Intrinsic Amplitude: 0.875 mV
Lead Channel Sensing Intrinsic Amplitude: 1.125 mV
Lead Channel Sensing Intrinsic Amplitude: 1.125 mV
Lead Channel Setting Pacing Amplitude: 2 V
Lead Channel Setting Pacing Amplitude: 2.5 V
Lead Channel Setting Pacing Amplitude: 2.75 V
Lead Channel Setting Pacing Pulse Width: 0.4 ms
Lead Channel Setting Pacing Pulse Width: 0.8 ms
Lead Channel Setting Sensing Sensitivity: 0.45 mV
Zone Setting Status: 755011
Zone Setting Status: 755011

## 2022-05-31 ENCOUNTER — Encounter: Payer: No Typology Code available for payment source | Admitting: Internal Medicine

## 2022-06-06 DIAGNOSIS — R0981 Nasal congestion: Secondary | ICD-10-CM | POA: Diagnosis not present

## 2022-06-06 DIAGNOSIS — Z20822 Contact with and (suspected) exposure to covid-19: Secondary | ICD-10-CM | POA: Diagnosis not present

## 2022-06-06 DIAGNOSIS — R059 Cough, unspecified: Secondary | ICD-10-CM | POA: Diagnosis not present

## 2022-06-07 DIAGNOSIS — R0981 Nasal congestion: Secondary | ICD-10-CM | POA: Diagnosis not present

## 2022-06-07 DIAGNOSIS — Z20822 Contact with and (suspected) exposure to covid-19: Secondary | ICD-10-CM | POA: Diagnosis not present

## 2022-06-07 DIAGNOSIS — R059 Cough, unspecified: Secondary | ICD-10-CM | POA: Diagnosis not present

## 2022-06-09 ENCOUNTER — Other Ambulatory Visit: Payer: Self-pay | Admitting: Cardiology

## 2022-06-14 ENCOUNTER — Encounter: Payer: No Typology Code available for payment source | Admitting: Cardiovascular Disease

## 2022-06-17 ENCOUNTER — Ambulatory Visit: Payer: No Typology Code available for payment source | Attending: Cardiovascular Disease

## 2022-06-17 DIAGNOSIS — I5022 Chronic systolic (congestive) heart failure: Secondary | ICD-10-CM

## 2022-06-17 DIAGNOSIS — Z95810 Presence of automatic (implantable) cardiac defibrillator: Secondary | ICD-10-CM

## 2022-06-21 ENCOUNTER — Telehealth: Payer: Self-pay

## 2022-06-21 NOTE — Progress Notes (Signed)
EPIC Encounter for ICM Monitoring  Patient Name: Tracy Mills is a 77 y.o. female Date: 06/21/2022 Primary Care Physican: [name], MD Primary Cardiologist: Domenic Polite Electrophysiologist: Mealor Bi-V Pacing:  96.3%    02/04/2022 Weight: 124 lbs 02/12/2022 Weight: 124.4 lbs 02/19/2022 Weight: 124 lbs 04/30/2022 Weight: 126 lbs   Clinical Status (29-Apr-2022 to 06-May-2022) Time in AT/AF   0.0 hr/day (0.0%)         Attempted call to patient and unable to reach.  Left detailed message per DPR regarding transmission. Transmission reviewed.          Optivol Thoracic impedance suggesting possible fluid accumulation starting 05/11/2022 with exception of a few days at baseline within the last month.  Fluid index higher than normal threshold starting 05/23/22.   Prescribed: Furosemide 20 mg Take 1-2 tablets (20-40 mg total) by mouth daily. Alternated 20 mg and 40 mg every other day Spironolactone 25 mg take 1 tablet daily Jardiance 10 mg take 1 tablet by mouth daily before breakfast   Labs: 02/13/2022 Creatinine 1.14, BUN 7,   Potassium 4.0, Sodium 139, GFR 50 05/22/2021 Creatinine 1.17, BUN 15, Potassium 3.8, Sodium 138, GFR 49 A complete set of results can be found in Results Review.   Recommendations:  Left voice mail with ICM number and encouraged to call if experiencing any fluid symptoms.    Follow-up plan: ICM clinic phone appointment on 2/26/06/2022.   91 day device clinic remote transmission 08/27/2022.     EP/Cardiology Office Visits:.  07/11/2022 with Dr Myles Gip.     Copy of ICM check sent to Dr. Myles Gip.   3 month ICM trend: 06/18/2022.    12-14 Month ICM trend:     Rosalene Billings, RN 06/21/2022 3:37 PM

## 2022-06-21 NOTE — Telephone Encounter (Signed)
Remote ICM transmission received.  Attempted call to patient regarding ICM remote transmission and left detailed message per DPR.  Advised to return call for any fluid symptoms or questions. Next ICM remote transmission scheduled 07/22/2022.    

## 2022-07-02 NOTE — Progress Notes (Signed)
Remote ICD transmission.   

## 2022-07-11 ENCOUNTER — Ambulatory Visit: Payer: Medicare HMO | Attending: Cardiovascular Disease | Admitting: Cardiovascular Disease

## 2022-07-11 ENCOUNTER — Encounter: Payer: Self-pay | Admitting: Cardiovascular Disease

## 2022-07-11 VITALS — BP 114/75 | HR 68 | Ht 63.0 in | Wt 126.0 lb

## 2022-07-11 DIAGNOSIS — I10 Essential (primary) hypertension: Secondary | ICD-10-CM | POA: Diagnosis not present

## 2022-07-11 DIAGNOSIS — I472 Ventricular tachycardia, unspecified: Secondary | ICD-10-CM | POA: Diagnosis not present

## 2022-07-11 DIAGNOSIS — I5022 Chronic systolic (congestive) heart failure: Secondary | ICD-10-CM

## 2022-07-11 LAB — CUP PACEART INCLINIC DEVICE CHECK
Battery Remaining Longevity: 74 mo
Battery Voltage: 2.99 V
Brady Statistic AP VP Percent: 59.28 %
Brady Statistic AP VS Percent: 1.3 %
Brady Statistic AS VP Percent: 38.8 %
Brady Statistic AS VS Percent: 0.62 %
Brady Statistic RA Percent Paced: 60.43 %
Brady Statistic RV Percent Paced: 9.26 %
Date Time Interrogation Session: 20240215161444
HighPow Impedance: 47 Ohm
HighPow Impedance: 61 Ohm
Implantable Lead Connection Status: 753985
Implantable Lead Connection Status: 753985
Implantable Lead Connection Status: 753985
Implantable Lead Implant Date: 20110119
Implantable Lead Implant Date: 20110119
Implantable Lead Implant Date: 20150623
Implantable Lead Location: 753858
Implantable Lead Location: 753859
Implantable Lead Location: 753860
Implantable Lead Model: 4598
Implantable Lead Model: 5076
Implantable Lead Model: 6947
Implantable Pulse Generator Implant Date: 20210930
Lead Channel Impedance Value: 1026 Ohm
Lead Channel Impedance Value: 1064 Ohm
Lead Channel Impedance Value: 1121 Ohm
Lead Channel Impedance Value: 230.327
Lead Channel Impedance Value: 234.08 Ohm
Lead Channel Impedance Value: 262.136
Lead Channel Impedance Value: 296.578
Lead Channel Impedance Value: 302.831
Lead Channel Impedance Value: 399 Ohm
Lead Channel Impedance Value: 418 Ohm
Lead Channel Impedance Value: 418 Ohm
Lead Channel Impedance Value: 456 Ohm
Lead Channel Impedance Value: 513 Ohm
Lead Channel Impedance Value: 532 Ohm
Lead Channel Impedance Value: 703 Ohm
Lead Channel Impedance Value: 703 Ohm
Lead Channel Impedance Value: 817 Ohm
Lead Channel Impedance Value: 817 Ohm
Lead Channel Pacing Threshold Amplitude: 0.5 V
Lead Channel Pacing Threshold Amplitude: 1.125 V
Lead Channel Pacing Threshold Amplitude: 1.125 V
Lead Channel Pacing Threshold Pulse Width: 0.4 ms
Lead Channel Pacing Threshold Pulse Width: 0.4 ms
Lead Channel Pacing Threshold Pulse Width: 0.8 ms
Lead Channel Sensing Intrinsic Amplitude: 1.125 mV
Lead Channel Sensing Intrinsic Amplitude: 1.5 mV
Lead Channel Sensing Intrinsic Amplitude: 22.125 mV
Lead Channel Sensing Intrinsic Amplitude: 25 mV
Lead Channel Setting Pacing Amplitude: 2 V
Lead Channel Setting Pacing Amplitude: 2.25 V
Lead Channel Setting Pacing Amplitude: 2.5 V
Lead Channel Setting Pacing Pulse Width: 0.4 ms
Lead Channel Setting Pacing Pulse Width: 0.8 ms
Lead Channel Setting Sensing Sensitivity: 0.45 mV
Zone Setting Status: 755011
Zone Setting Status: 755011

## 2022-07-11 MED ORDER — CARVEDILOL 6.25 MG PO TABS: 6.2500 mg | ORAL_TABLET | Freq: Two times a day (BID) | ORAL | 6 refills | Status: DC

## 2022-07-11 NOTE — Patient Instructions (Addendum)
Medication Instructions:  Increase Coreg to 6.53m twice a day   Continue all other medications.     Labwork: none  Testing/Procedures: none  Follow-Up: 1 year - Dr.  MMyles Gip  Any Other Special Instructions Will Be Listed Below (If Applicable).   If you need a refill on your cardiac medications before your next appointment, please call your pharmacy.

## 2022-07-11 NOTE — Progress Notes (Signed)
PCP: [name], MD Primary Cardiologist: Dr Domenic Polite Primary EP: Dr Kallie Locks is a 77 y.o. female who presents today for routine electrophysiology followup.  Since last being seen in our clinic, the patient reports doing very well.  Today, she denies symptoms of palpitations, chest pain, shortness of breath,  lower extremity edema, dizziness, presyncope, syncope, or ICD shocks.  The patient is otherwise without complaint today.   Past Medical History:  Diagnosis Date   Chronic systolic congestive heart failure (HCC)    Coronary artery disease    Anterior MI in 2010. LAD stent placement 2010, [place].   Essential hypertension    History of ventricular fibrillation 2013   Ischemic cardiomyopathy    LBBB (left bundle branch block)    MR (mitral regurgitation)    Past Surgical History:  Procedure Laterality Date   BI-VENTRICULAR IMPLANTABLE CARDIOVERTER DEFIBRILLATOR UPGRADE  11-16-2013   upgrade of previously implanted dual chamber ICD to MDT Hillery Aldo XT CRTD by Dr [name]   BI-VENTRICULAR IMPLANTABLE CARDIOVERTER DEFIBRILLATOR UPGRADE N/A 11/16/2013   Procedure: BI-VENTRICULAR IMPLANTABLE CARDIOVERTER DEFIBRILLATOR UPGRADE;  Surgeon: Coralyn Mark, MD;  Location[facility] LAB;  Service: Cardiovascular;  Laterality: N/A;   BIV ICD GENERATOR CHANGEOUT N/A 02/24/2020   Procedure: BIV ICD GENERATOR CHANGEOUT;  Surgeon: [name], MD;  Location: [facility] CV LAB;  Service: Cardiovascular;  Laterality: N/A;   CARDIAC CATHETERIZATION  2010   LAD PCI and stent placement   CARDIAC DEFIBRILLATOR PLACEMENT  06/14/09   MDT [facility] [name]   TONSILLECTOMY AND ADENOIDECTOMY     TUBAL LIGATION      ROS- all systems are reviewed and negative except as per HPI above  Current Outpatient Medications  Medication Sig Dispense Refill   acetaminophen (TYLENOL) 500 MG tablet Take 1,000 mg by mouth every 6 (six) hours as needed for moderate pain.      aspirin 81 MG tablet Take 81  mg by mouth daily.     carvedilol (COREG) 3.125 MG tablet TAKE 1 TABLET TWICE DAILY 180 tablet 3   cetirizine (ZYRTEC) 10 MG tablet Take 10 mg by mouth daily as needed for allergies.      colchicine 0.6 MG tablet Take 0.6 mg by mouth daily as needed (gout).     empagliflozin (JARDIANCE) 10 MG TABS tablet Take 1 tablet (10 mg total) by mouth daily before breakfast. 30 tablet 6   furosemide (LASIX) 20 MG tablet Take 1-2 tablets (20-40 mg total) by mouth daily. Alternated 20 mg and 40 mg every other day 135 tablet 3   Naphazoline-Pheniramine (ALLERGY EYE OP) Place 1 drop into both eyes daily as needed (itchy eyes/allergies).     nitroGLYCERIN (NITROSTAT) 0.4 MG SL tablet DISSOLVE 1 TABLET UNDER THE TONGUE EVERY 5 MINUTES UP TO 3 DOSES AS NEEDED FOR CHEST PAIN. IF NO RELIEF CALL 911 OR GO TO ER (Patient taking differently: Place 0.4 mg under the tongue every 5 (five) minutes x 3 doses as needed for chest pain.) 25 tablet 3   pantoprazole (PROTONIX) 40 MG tablet Take 40 mg by mouth daily as needed (heartburn).     rosuvastatin (CRESTOR) 20 MG tablet Take 1 tablet (20 mg total) by mouth daily. 90 tablet 3   sacubitril-valsartan (ENTRESTO) 24-26 MG TAKE 1 TABLET TWICE A DAY 180 tablet 2   spironolactone (ALDACTONE) 25 MG tablet TAKE 1 TABLET DAILY 90 tablet 3   triamcinolone cream (KENALOG) 0.5 % Apply 1 application topically  2 (two) times daily as needed (rash/irritation).     No current facility-administered medications for this visit.    Physical Exam: Vitals:   07/11/22 1544  BP: 114/75  Pulse: 68  Weight: 126 lb (57.2 kg)  Height: 5' 3"$  (1.6 m)    Gen: Appears comfortable, well-nourished CV: RRR, no dependent edema The device site is normal -- no tenderness, edema, drainage, redness, threatened erosion. Pulm: breathing easily   ICD interrogation- reviewed in detail today,  See PACEART report. She has had a few brief atrial high rate episodes, longest was about 7 minutes in July  2023  ECG today shows AV dual paced rhythm    Wt Readings from Last 3 Encounters:  07/11/22 126 lb (57.2 kg)  02/13/22 125 lb 3.2 oz (56.8 kg)  08/10/21 132 lb 9.6 oz (60.1 kg)    Assessment and Plan:  1.  Chronic systolic dysfunction/ CAD/ ischemic CM/ prior VF euvolemic today Stable on an appropriate medical regimen Normal ICD function See Pace Art report No changes today she is not device dependant today followed in ICM device clinic  2. HTN Stable No change required today  3. Atrial high rate episodes Very brief, just a few minutes total over the past year; doesn't meet threshold to start anticoagulation. Will increase carvedilol to 6.25 BID  Risks, benefits and potential toxicities for medications prescribed and/or refilled reviewed with patient today.   Return in a year  Melida Quitter, MD 07/11/2022 4:21 PM

## 2022-07-22 ENCOUNTER — Ambulatory Visit: Payer: Medicare HMO | Attending: Cardiovascular Disease

## 2022-07-22 DIAGNOSIS — Z95810 Presence of automatic (implantable) cardiac defibrillator: Secondary | ICD-10-CM | POA: Diagnosis not present

## 2022-07-22 DIAGNOSIS — I5022 Chronic systolic (congestive) heart failure: Secondary | ICD-10-CM

## 2022-07-26 NOTE — Progress Notes (Signed)
EPIC Encounter for ICM Monitoring  Patient Name: Tracy Mills is a 77 y.o. female Date: 07/26/2022 Primary Care Physican: [name], MD Primary Cardiologist: Domenic Polite Electrophysiologist: Mealor Bi-V Pacing:  96.6%    04/30/2022 Weight: 126 lbs 07/26/2022 Weight: 128 lbs   Clinical Status (11-Jul-2022 to 22-Jul-2022) Time in AT/AF   0.0 hr/day (0.0%)         Spoke with patient and heart failure questions reviewed.  Transmission results reviewed.  Pt asymptomatic for fluid accumulation.  Reports feeling well at this time and voices no complaints.           Optivol Thoracic impedance suggesting possible fluid accumulation starting 2/18 and returned to baseline normal 2/26.   Prescribed: Furosemide 20 mg Take 1-2 tablets (20-40 mg total) by mouth daily. Alternated 20 mg and 40 mg every other day Spironolactone 25 mg take 1 tablet daily Jardiance 10 mg take 1 tablet by mouth daily before breakfast   Labs: 02/13/2022 Creatinine 1.14, BUN 7,   Potassium 4.0, Sodium 139, GFR 50 05/22/2021 Creatinine 1.17, BUN 15, Potassium 3.8, Sodium 138, GFR 49 A complete set of results can be found in Results Review.   Recommendations: No changes and encouraged to call if experiencing any fluid symptoms.   Follow-up plan: ICM clinic phone appointment on 08/28/2022.   91 day device clinic remote transmission 08/27/2022.     EP/Cardiology Office Visits:.  08/12/2022 Finis Bud, NP.    07/04/2023 with Dr Myles Gip.     Copy of ICM check sent to Dr. Myles Gip.   3 month ICM trend: 07/22/2022.    12-14 Month ICM trend:     Rosalene Billings, RN 07/26/2022 3:51 PM

## 2022-08-05 DIAGNOSIS — H5203 Hypermetropia, bilateral: Secondary | ICD-10-CM | POA: Diagnosis not present

## 2022-08-05 DIAGNOSIS — H52223 Regular astigmatism, bilateral: Secondary | ICD-10-CM | POA: Diagnosis not present

## 2022-08-05 DIAGNOSIS — H524 Presbyopia: Secondary | ICD-10-CM | POA: Diagnosis not present

## 2022-08-06 DIAGNOSIS — H52223 Regular astigmatism, bilateral: Secondary | ICD-10-CM | POA: Diagnosis not present

## 2022-08-06 DIAGNOSIS — H524 Presbyopia: Secondary | ICD-10-CM | POA: Diagnosis not present

## 2022-08-11 IMAGING — DX DG CHEST 2V
2 series · 2 of 2 positions shown · non-contrast
Comparison: November 17, 2013

CLINICAL DATA: Beeping sound from pacemaker since today.

EXAM:
CHEST - 2 VIEW

[chest pa]
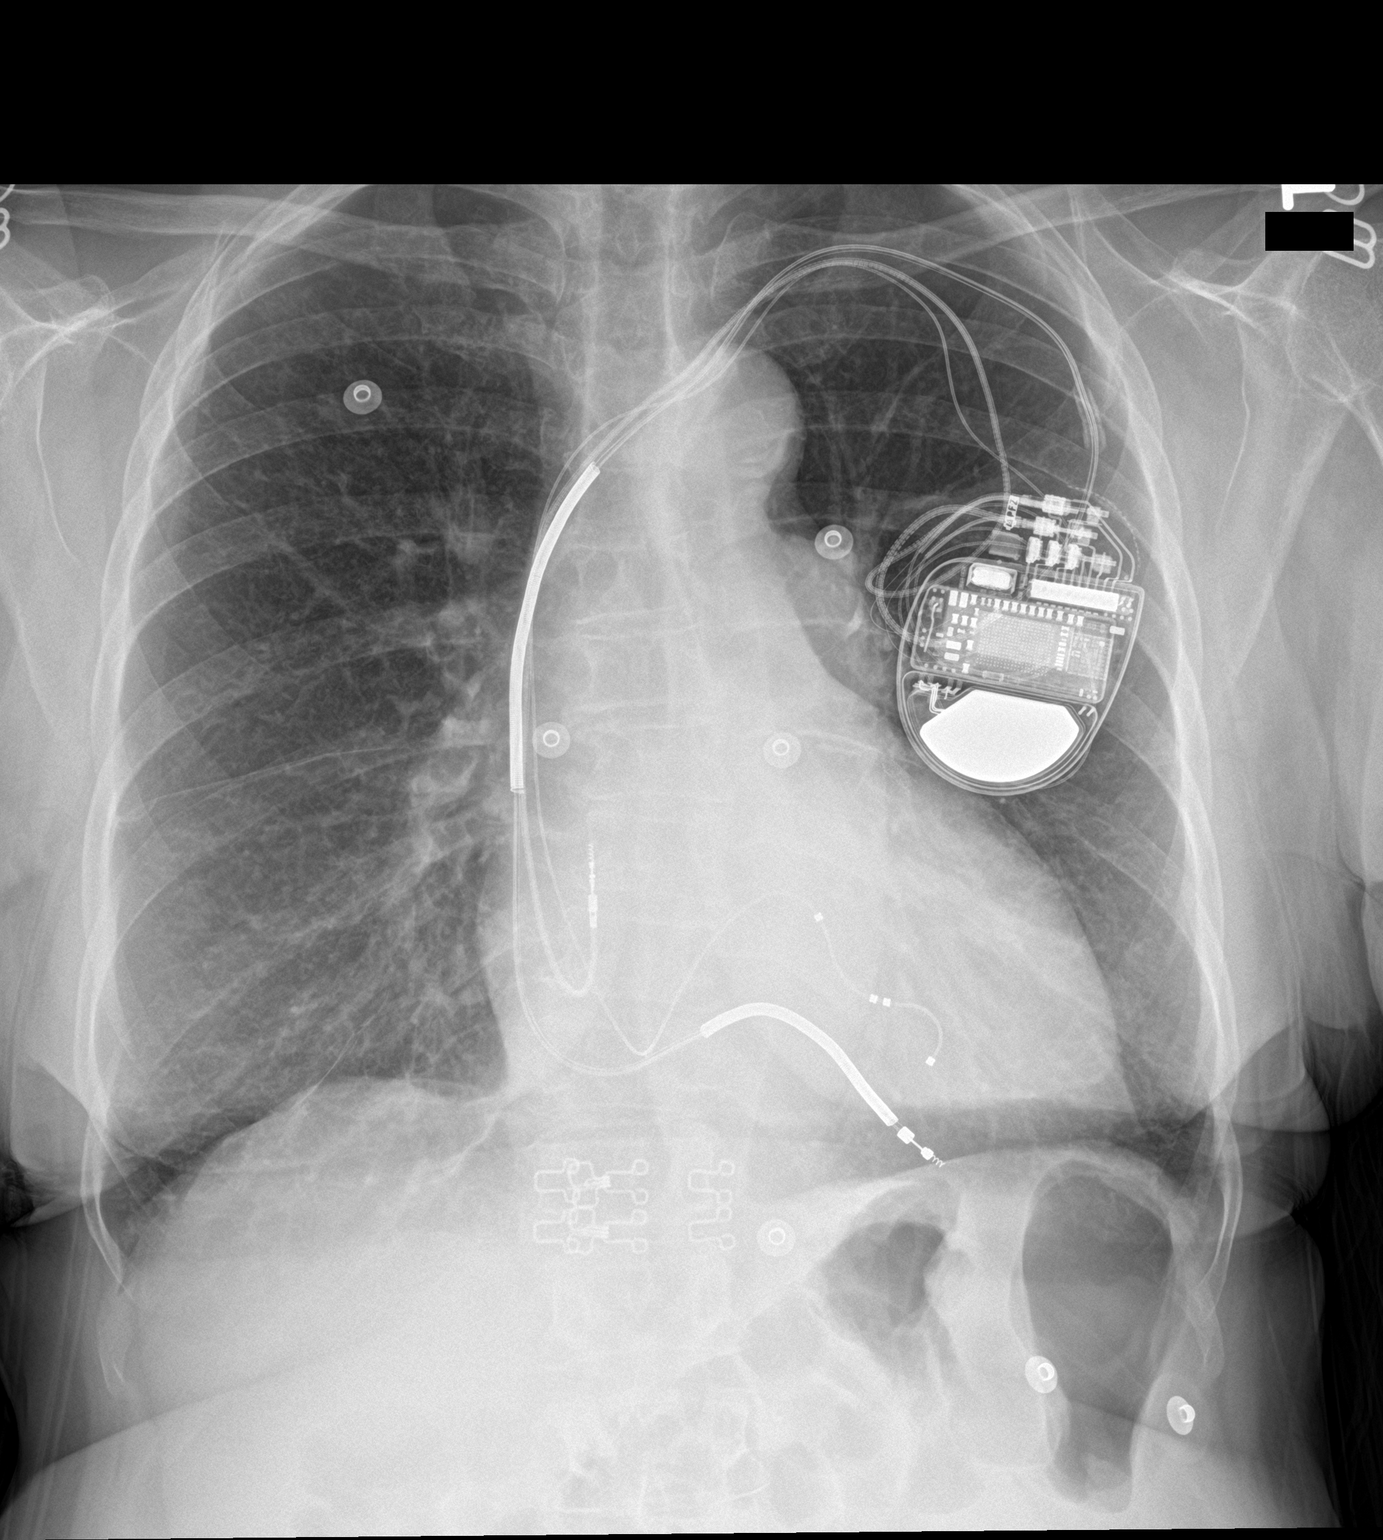

[chest lat]
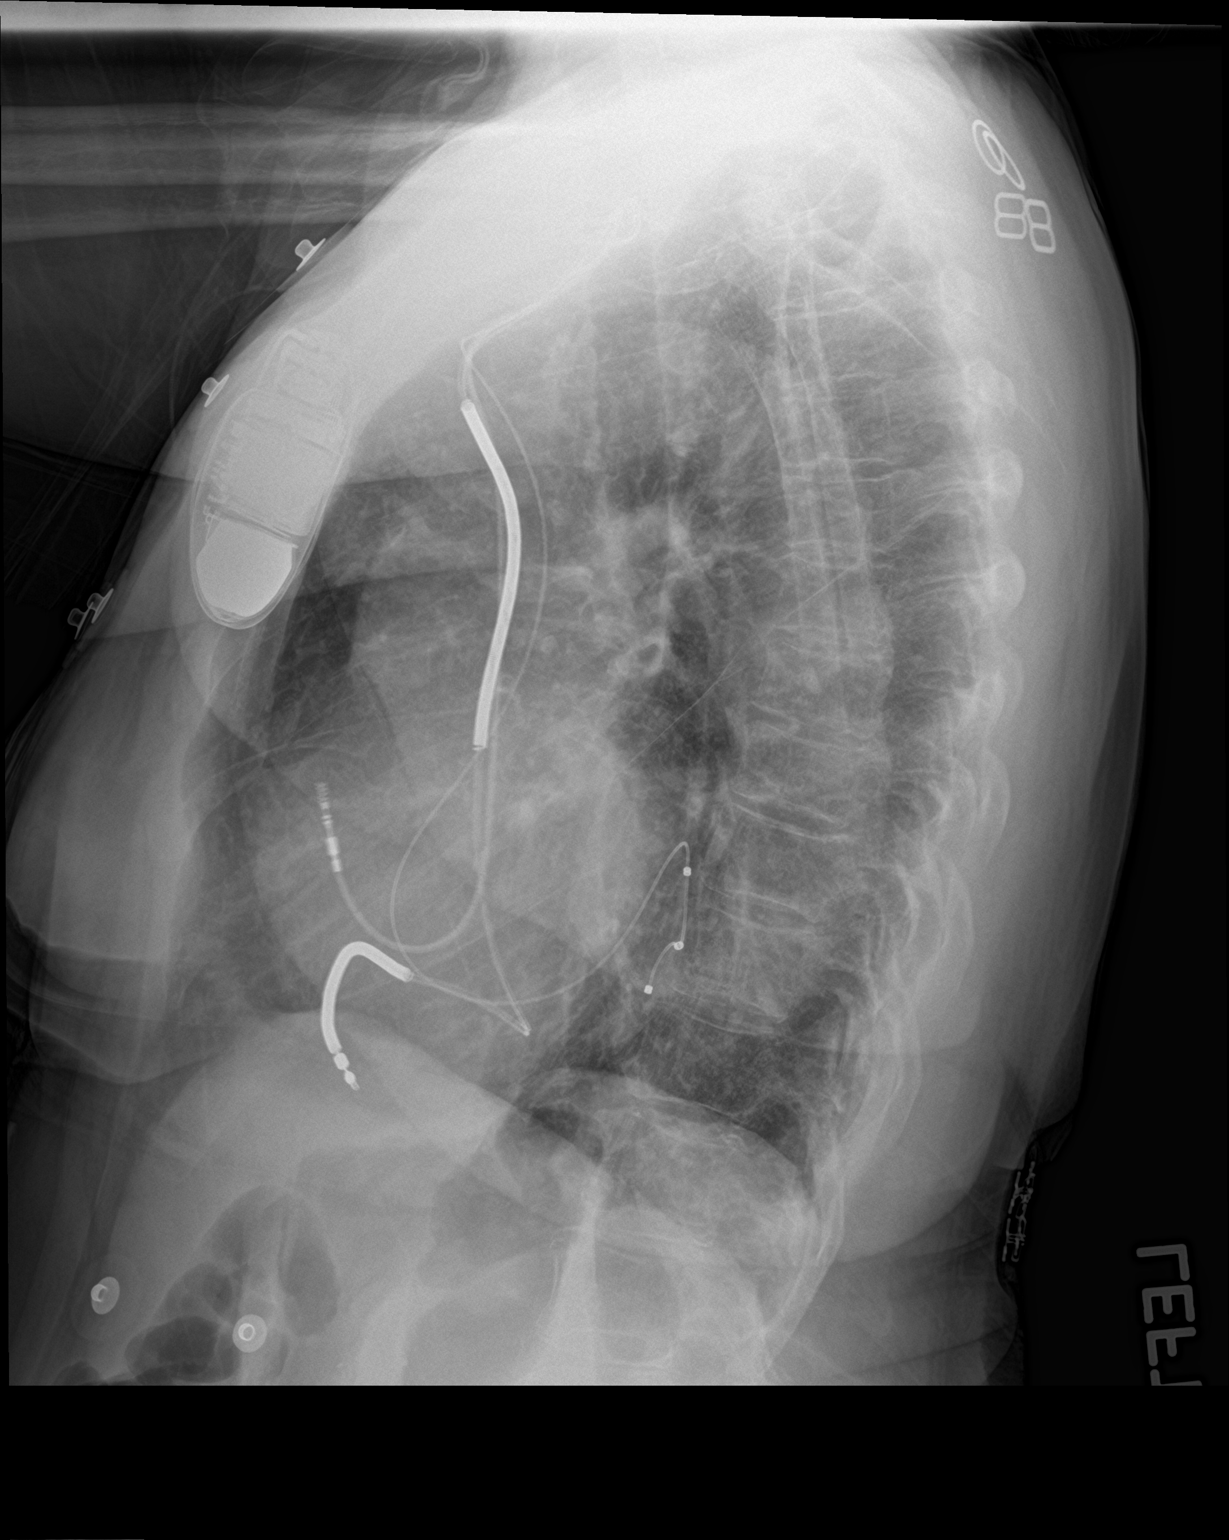

[2 of 2 positions shown; findings below may reference images not displayed]

FINDINGS: A multi lead AICD is noted with stable lead wire positioning. The
cardiac silhouette is mildly enlarged. Both lungs are clear. The
visualized skeletal structures are unremarkable.
IMPRESSION: Stable exam without active cardiopulmonary disease.

## 2022-08-12 ENCOUNTER — Ambulatory Visit: Payer: No Typology Code available for payment source | Admitting: Nurse Practitioner

## 2022-08-27 ENCOUNTER — Ambulatory Visit (INDEPENDENT_AMBULATORY_CARE_PROVIDER_SITE_OTHER): Payer: Medicare HMO

## 2022-08-27 ENCOUNTER — Encounter: Payer: Self-pay | Admitting: Nurse Practitioner

## 2022-08-27 ENCOUNTER — Ambulatory Visit: Payer: Medicare HMO | Attending: Nurse Practitioner | Admitting: Nurse Practitioner

## 2022-08-27 VITALS — BP 118/62 | HR 82 | Ht 63.0 in | Wt 127.2 lb

## 2022-08-27 DIAGNOSIS — I255 Ischemic cardiomyopathy: Secondary | ICD-10-CM | POA: Diagnosis not present

## 2022-08-27 DIAGNOSIS — I340 Nonrheumatic mitral (valve) insufficiency: Secondary | ICD-10-CM

## 2022-08-27 DIAGNOSIS — Z95810 Presence of automatic (implantable) cardiac defibrillator: Secondary | ICD-10-CM | POA: Diagnosis not present

## 2022-08-27 DIAGNOSIS — R053 Chronic cough: Secondary | ICD-10-CM

## 2022-08-27 DIAGNOSIS — I2510 Atherosclerotic heart disease of native coronary artery without angina pectoris: Secondary | ICD-10-CM | POA: Diagnosis not present

## 2022-08-27 DIAGNOSIS — I5022 Chronic systolic (congestive) heart failure: Secondary | ICD-10-CM

## 2022-08-27 DIAGNOSIS — I10 Essential (primary) hypertension: Secondary | ICD-10-CM | POA: Diagnosis not present

## 2022-08-27 NOTE — Progress Notes (Unsigned)
Office Visit    Patient Name: Tracy Mills Date of Encounter: 08/27/2022  PCP:  [name], MD   [place]  Cardiologist:  Rozann Lesches, MD *** Advanced Practice Provider:  No care team member to display Electrophysiologist:  [name], MD (Inactive)  {Press F2 to show EP APP, CHF, sleep or structural heart MD               :[id number]  { Click here to update then REFRESH NOTE - MD (PCP) or APP (Team Member)  Change PCP Type for MD, Specialty for APP is either Cardiology or Clinical Cardiac Electrophysiology  :[id number]  Chief Complaint    Tracy Mills is a 77 y.o. female with a hx of chronic systolic CHF/ICM, CAD, hypertension, history of V-fib, s/p ICD, MR, LBBB, who presents today for 82-month follow-up.  Past Medical History    Past Medical History:  Diagnosis Date   Chronic systolic congestive heart failure (HCC)    Coronary artery disease    Anterior MI in 2010. LAD stent placement 2010, [place].   Essential hypertension    History of ventricular fibrillation 2013   Ischemic cardiomyopathy    LBBB (left bundle branch block)    MR (mitral regurgitation)    Past Surgical History:  Procedure Laterality Date   BI-VENTRICULAR IMPLANTABLE CARDIOVERTER DEFIBRILLATOR UPGRADE  11-16-2013   upgrade of previously implanted dual chamber ICD to MDT Hillery Aldo XT CRTD by Dr [name]   BI-VENTRICULAR IMPLANTABLE CARDIOVERTER DEFIBRILLATOR UPGRADE N/A 11/16/2013   Procedure: BI-VENTRICULAR IMPLANTABLE CARDIOVERTER DEFIBRILLATOR UPGRADE;  Surgeon: Coralyn Mark, MD;  Location: [facility] CATH LAB;  Service: Cardiovascular;  Laterality: N/A;   BIV ICD GENERATOR CHANGEOUT N/A 02/24/2020   Procedure: BIV ICD GENERATOR CHANGEOUT;  Surgeon: [name], MD;  Location: [facility] CV LAB;  Service: Cardiovascular;  Laterality: N/A;   CARDIAC CATHETERIZATION  2010   LAD PCI and stent placement   CARDIAC DEFIBRILLATOR PLACEMENT  06/14/09   MDT [place] Dr [name]    TONSILLECTOMY AND ADENOIDECTOMY     TUBAL LIGATION      Allergies  Allergies  Allergen Reactions   Penicillins Hives   Sulfa Antibiotics Hives   Tetracyclines & Related Other (See Comments)    "yeast infections"    History of Present Illness    Tracy Mills is a 77 y.o. female with a PMH as mentioned above.  Last seen by Dr. Domenic Polite on September 2023.  At that time, patient stopped Jardiance thinking this was causing chronic cough, did not change her symptoms after stopping.  She self increased her Lasix to 40 mg daily.  Overall was doing well, weight was down 7 pounds from previous office visit.  However, patient did note some interval fluid accumulation.  TTE from April 2023 revealed EF 25 to 30%, wall motion abnormalities consistent with ischemic cardiomyopathy, mild MR, normal RV contraction.  Today she presents for 35-month follow-up.  She states EKGs/Labs/Other Studies Reviewed:   The following studies were reviewed today: ***  EKG:  EKG is *** ordered today.  The ekg ordered today demonstrates ***  Recent Labs: No results found for requested labs within last 365 days.  Recent Lipid Panel No results found for: "CHOL", "TRIG", "HDL", "CHOLHDL", "VLDL", "LDLCALC", "LDLDIRECT"  Risk Assessment/Calculations:  {Does this patient have ATRIAL FIBRILLATION?[phone]}  Home Medications   No outpatient medications have been marked as taking for the 08/27/22 encounter (Appointment) with Finis Bud, NP.  Review of Systems   ***   All other systems reviewed and are otherwise negative except as noted above.  Physical Exam    VS:  There were no vitals taken for this visit. , BMI There is no height or weight on file to calculate BMI.  Wt Readings from Last 3 Encounters:  07/11/22 126 lb (57.2 kg)  02/13/22 125 lb 3.2 oz (56.8 kg)  08/10/21 132 lb 9.6 oz (60.1 kg)     GEN: Well nourished, well developed, in no acute distress. HEENT: normal. Neck: Supple, no  JVD, carotid bruits, or masses. Cardiac: ***RRR, no murmurs, rubs, or gallops. No clubbing, cyanosis, edema.  ***Radials/PT 2+ and equal bilaterally.  Respiratory:  ***Respirations regular and unlabored, clear to auscultation bilaterally. GI: Soft, nontender, nondistended. MS: No deformity or atrophy. Skin: Warm and dry, no rash. Neuro:  Strength and sensation are intact. Psych: Normal affect.  Assessment & Plan    ***  {Are you ordering a CV Procedure (e.g. stress test, cath, DCCV, TEE, etc)?   Press F2        :[id number]      Disposition: Follow up {follow up:15908} with Rozann Lesches, MD or APP.  Signed, Finis Bud, NP 08/27/2022, 8:25 AM [place]

## 2022-08-27 NOTE — Patient Instructions (Addendum)
Medication Instructions:  Your physician recommends that you continue on your current medications as directed. Please refer to the Current Medication list given to you today.  Take carvedilol if systolic blood pressure is greater than 120  Labwork: none  Testing/Procedures: Your physician has requested that you have an echocardiogram. Echocardiography is a painless test that uses sound waves to create images of your heart. It provides your doctor with information about the size and shape of your heart and how well your heart's chambers and valves are working. This procedure takes approximately one hour. There are no restrictions for this procedure. Please do NOT wear cologne, perfume, aftershave, or lotions (deodorant is allowed). Please arrive 15 minutes prior to your appointment time.   Follow-Up:  Your physician recommends that you schedule a follow-up appointment in: 6 months  Any Other Special Instructions Will Be Listed Below (If Applicable).  You have been referred to Pulmonology   If you need a refill on your cardiac medications before your next appointment, please call your pharmacy.

## 2022-08-28 ENCOUNTER — Ambulatory Visit: Payer: Medicare HMO | Attending: Cardiovascular Disease

## 2022-08-28 DIAGNOSIS — I5022 Chronic systolic (congestive) heart failure: Secondary | ICD-10-CM | POA: Diagnosis not present

## 2022-08-28 DIAGNOSIS — Z95810 Presence of automatic (implantable) cardiac defibrillator: Secondary | ICD-10-CM

## 2022-08-28 LAB — CUP PACEART REMOTE DEVICE CHECK
Battery Remaining Longevity: 71 mo
Battery Remaining Longevity: 70 mo
Battery Voltage: 2.99 V
Battery Voltage: 2.99 V
Brady Statistic AP VP Percent: 67.44 %
Brady Statistic AP VP Percent: 91.74 %
Brady Statistic AP VS Percent: 1.78 %
Brady Statistic AP VS Percent: 1.94 %
Brady Statistic AS VP Percent: 30.27 %
Brady Statistic AS VP Percent: 6.22 %
Brady Statistic AS VS Percent: 0.51 %
Brady Statistic AS VS Percent: 0.1 %
Brady Statistic RA Percent Paced: 69.04 %
Brady Statistic RA Percent Paced: 93.6 %
Brady Statistic RV Percent Paced: 8.76 %
Brady Statistic RV Percent Paced: 5.93 %
Date Time Interrogation Session: 20240402031606
Date Time Interrogation Session: 20240402121939
HighPow Impedance: 43 Ohm
HighPow Impedance: 52 Ohm
HighPow Impedance: 42 Ohm
HighPow Impedance: 54 Ohm
Implantable Lead Connection Status: 753985
Implantable Lead Connection Status: 753985
Implantable Lead Connection Status: 753985
Implantable Lead Connection Status: 753985
Implantable Lead Connection Status: 753985
Implantable Lead Connection Status: 753985
Implantable Lead Implant Date: 20110119
Implantable Lead Implant Date: 20110119
Implantable Lead Implant Date: 20150623
Implantable Lead Implant Date: 20110119
Implantable Lead Implant Date: 20110119
Implantable Lead Implant Date: 20150623
Implantable Lead Location: 753858
Implantable Lead Location: 753859
Implantable Lead Location: 753860
Implantable Lead Location: 753858
Implantable Lead Location: 753859
Implantable Lead Location: 753860
Implantable Lead Model: 4598
Implantable Lead Model: 5076
Implantable Lead Model: 6947
Implantable Lead Model: 4598
Implantable Lead Model: 5076
Implantable Lead Model: 6947
Implantable Pulse Generator Implant Date: 20210930
Implantable Pulse Generator Implant Date: 20210930
Lead Channel Impedance Value: 1007 Ohm
Lead Channel Impedance Value: 211.891
Lead Channel Impedance Value: 215.064
Lead Channel Impedance Value: 226.51 Ohm
Lead Channel Impedance Value: 278.237
Lead Channel Impedance Value: 283.733
Lead Channel Impedance Value: 304 Ohm
Lead Channel Impedance Value: 361 Ohm
Lead Channel Impedance Value: 399 Ohm
Lead Channel Impedance Value: 418 Ohm
Lead Channel Impedance Value: 513 Ohm
Lead Channel Impedance Value: 532 Ohm
Lead Channel Impedance Value: 608 Ohm
Lead Channel Impedance Value: 703 Ohm
Lead Channel Impedance Value: 760 Ohm
Lead Channel Impedance Value: 779 Ohm
Lead Channel Impedance Value: 874 Ohm
Lead Channel Impedance Value: 988 Ohm
Lead Channel Impedance Value: 201.488
Lead Channel Impedance Value: 205.114
Lead Channel Impedance Value: 231.585
Lead Channel Impedance Value: 267.31 Ohm
Lead Channel Impedance Value: 273.729
Lead Channel Impedance Value: 304 Ohm
Lead Channel Impedance Value: 361 Ohm
Lead Channel Impedance Value: 418 Ohm
Lead Channel Impedance Value: 418 Ohm
Lead Channel Impedance Value: 456 Ohm
Lead Channel Impedance Value: 475 Ohm
Lead Channel Impedance Value: 646 Ohm
Lead Channel Impedance Value: 646 Ohm
Lead Channel Impedance Value: 722 Ohm
Lead Channel Impedance Value: 722 Ohm
Lead Channel Impedance Value: 893 Ohm
Lead Channel Impedance Value: 988 Ohm
Lead Channel Impedance Value: 988 Ohm
Lead Channel Pacing Threshold Amplitude: 0.5 V
Lead Channel Pacing Threshold Amplitude: 1.25 V
Lead Channel Pacing Threshold Amplitude: 1.25 V
Lead Channel Pacing Threshold Amplitude: 0.5 V
Lead Channel Pacing Threshold Amplitude: 1.25 V
Lead Channel Pacing Threshold Amplitude: 1.25 V
Lead Channel Pacing Threshold Pulse Width: 0.4 ms
Lead Channel Pacing Threshold Pulse Width: 0.4 ms
Lead Channel Pacing Threshold Pulse Width: 0.8 ms
Lead Channel Pacing Threshold Pulse Width: 0.4 ms
Lead Channel Pacing Threshold Pulse Width: 0.4 ms
Lead Channel Pacing Threshold Pulse Width: 0.8 ms
Lead Channel Sensing Intrinsic Amplitude: 1.125 mV
Lead Channel Sensing Intrinsic Amplitude: 1.125 mV
Lead Channel Sensing Intrinsic Amplitude: 22.875 mV
Lead Channel Sensing Intrinsic Amplitude: 22.875 mV
Lead Channel Sensing Intrinsic Amplitude: 1.125 mV
Lead Channel Sensing Intrinsic Amplitude: 1.125 mV
Lead Channel Sensing Intrinsic Amplitude: 22.875 mV
Lead Channel Sensing Intrinsic Amplitude: 22.875 mV
Lead Channel Setting Pacing Amplitude: 2 V
Lead Channel Setting Pacing Amplitude: 2.25 V
Lead Channel Setting Pacing Amplitude: 2.5 V
Lead Channel Setting Pacing Amplitude: 2 V
Lead Channel Setting Pacing Amplitude: 2.25 V
Lead Channel Setting Pacing Amplitude: 2.5 V
Lead Channel Setting Pacing Pulse Width: 0.4 ms
Lead Channel Setting Pacing Pulse Width: 0.8 ms
Lead Channel Setting Pacing Pulse Width: 0.4 ms
Lead Channel Setting Pacing Pulse Width: 0.8 ms
Lead Channel Setting Sensing Sensitivity: 0.45 mV
Lead Channel Setting Sensing Sensitivity: 0.45 mV
Zone Setting Status: 755011
Zone Setting Status: 755011
Zone Setting Status: 755011
Zone Setting Status: 755011

## 2022-08-28 NOTE — Progress Notes (Signed)
EPIC Encounter for ICM Monitoring  Patient Name: Tracy Mills is a 77 y.o. female Date: 08/28/2022 Primary Care Physican: Ignatius Specking, MD Primary Cardiologist: Diona Browner Electrophysiologist: Mealor Bi-V Pacing:  94.7%    08/30/2022 Weight: 128 lbs   Clinical Status Since 22-Jul-2022 Time in AT/AF   0.0 hr/day (0.0%)         Spoke with patient and heart failure questions reviewed.  Transmission results reviewed.  Pt asymptomatic for fluid accumulation.  Reports feeling well at this time and voices no complaints.           Optivol Thoracic impedance suggesting normal fluid levels.   Prescribed: Furosemide 20 mg Take 1-2 tablets (20-40 mg total) by mouth daily. Alternated 20 mg and 40 mg every other day Spironolactone 25 mg take 1 tablet daily Jardiance 10 mg take 1 tablet by mouth daily before breakfast   Labs: 02/13/2022 Creatinine 1.14, BUN 7,   Potassium 4.0, Sodium 139, GFR 50 05/22/2021 Creatinine 1.17, BUN 15, Potassium 3.8, Sodium 138, GFR 49 A complete set of results can be found in Results Review.   Recommendations: No changes and encouraged to call if experiencing any fluid symptoms.   Follow-up plan: ICM clinic phone appointment on5/10/2022.   91 day device clinic remote transmission 11/26/2022.     EP/Cardiology Office Visits:.  11/26/2022 Sharlene Dory, NP.    07/04/2023 with Dr [name].     Copy of ICM check sent to Dr. [name].    3 month ICM trend: 08/27/2022.    12-14 Month ICM trend:     [name], RN 08/28/2022 8:12 AM

## 2022-09-18 DIAGNOSIS — Z0000 Encounter for general adult medical examination without abnormal findings: Secondary | ICD-10-CM | POA: Diagnosis not present

## 2022-09-18 DIAGNOSIS — E261 Secondary hyperaldosteronism: Secondary | ICD-10-CM | POA: Diagnosis not present

## 2022-09-18 DIAGNOSIS — I25700 Atherosclerosis of coronary artery bypass graft(s), unspecified, with unstable angina pectoris: Secondary | ICD-10-CM | POA: Diagnosis not present

## 2022-09-18 DIAGNOSIS — Z7189 Other specified counseling: Secondary | ICD-10-CM | POA: Diagnosis not present

## 2022-09-18 DIAGNOSIS — Z1339 Encounter for screening examination for other mental health and behavioral disorders: Secondary | ICD-10-CM | POA: Diagnosis not present

## 2022-09-18 DIAGNOSIS — Z1331 Encounter for screening for depression: Secondary | ICD-10-CM | POA: Diagnosis not present

## 2022-09-18 DIAGNOSIS — I509 Heart failure, unspecified: Secondary | ICD-10-CM | POA: Diagnosis not present

## 2022-09-18 DIAGNOSIS — I10 Essential (primary) hypertension: Secondary | ICD-10-CM | POA: Diagnosis not present

## 2022-09-18 DIAGNOSIS — Z299 Encounter for prophylactic measures, unspecified: Secondary | ICD-10-CM | POA: Diagnosis not present

## 2022-09-19 ENCOUNTER — Institutional Professional Consult (permissible substitution): Payer: Medicare HMO | Admitting: Internal Medicine

## 2022-09-20 DIAGNOSIS — R5383 Other fatigue: Secondary | ICD-10-CM | POA: Diagnosis not present

## 2022-09-20 DIAGNOSIS — Z79899 Other long term (current) drug therapy: Secondary | ICD-10-CM | POA: Diagnosis not present

## 2022-09-20 DIAGNOSIS — E7800 Pure hypercholesterolemia, unspecified: Secondary | ICD-10-CM | POA: Diagnosis not present

## 2022-09-22 ENCOUNTER — Other Ambulatory Visit: Payer: Self-pay | Admitting: Cardiology

## 2022-09-22 DIAGNOSIS — E785 Hyperlipidemia, unspecified: Secondary | ICD-10-CM

## 2022-09-23 ENCOUNTER — Ambulatory Visit: Payer: Medicare HMO | Attending: Nurse Practitioner

## 2022-09-23 DIAGNOSIS — I5022 Chronic systolic (congestive) heart failure: Secondary | ICD-10-CM

## 2022-09-23 LAB — ECHOCARDIOGRAM COMPLETE
AR max vel: 2.04 cm2
AV Area VTI: 1.85 cm2
AV Area mean vel: 1.89 cm2
AV Mean grad: 4 mmHg
AV Peak grad: 7.4 mmHg
Ao pk vel: 1.36 m/s
Area-P 1/2: 2.55 cm2
Calc EF: 27.6 %
MV M vel: 4.61 m/s
MV Peak grad: 84.9 mmHg
S' Lateral: 3.9 cm
Single Plane A2C EF: 28 %
Single Plane A4C EF: 30.3 %

## 2022-09-30 ENCOUNTER — Ambulatory Visit: Payer: Medicare HMO | Attending: Cardiovascular Disease

## 2022-09-30 DIAGNOSIS — Z95810 Presence of automatic (implantable) cardiac defibrillator: Secondary | ICD-10-CM

## 2022-09-30 DIAGNOSIS — I5022 Chronic systolic (congestive) heart failure: Secondary | ICD-10-CM | POA: Diagnosis not present

## 2022-10-04 NOTE — Progress Notes (Signed)
EPIC Encounter for ICM Monitoring  Patient Name: Tracy Mills is a 77 y.o. female Date: 10/04/2022 Primary Care Physican: Ignatius Specking, MD Primary Cardiologist: Diona Browner Electrophysiologist: Mealor Bi-V Pacing:  93.5%    08/30/2022 Weight: 128 lbs 10/04/2022 Weight: 128 lbs   Since 27-Aug-2022 Time in AT/AF   0.0 hr/day (0.0%)         Spoke with patient and heart failure questions reviewed.  Transmission results reviewed.  Pt asymptomatic for fluid accumulation.  Reports feeling well at this time and voices no complaints.           Optivol Thoracic impedance suggesting intermittent days with possible fluid accumulation within the last month.   Prescribed: Furosemide 20 mg Take 1-2 tablets (20-40 mg total) by mouth daily. Alternated 20 mg and 40 mg every other day.  10/04/2022 pt reports taking 1 tablet daily Spironolactone 25 mg take 1 tablet daily Jardiance 10 mg take 1 tablet by mouth daily before breakfast   Labs: 02/13/2022 Creatinine 1.14, BUN 7,   Potassium 4.0, Sodium 139, GFR 50 05/22/2021 Creatinine 1.17, BUN 15, Potassium 3.8, Sodium 138, GFR 49 A complete set of results can be found in Results Review.   Recommendations: No changes and encouraged to call if experiencing any fluid symptoms.   Follow-up plan: ICM clinic phone appointment on 11/04/2022.   91 day device clinic remote transmission 11/26/2022.     EP/Cardiology Office Visits:.  11/26/2022 Sharlene Dory, NP.    07/04/2023 with Dr [name].     Copy of ICM check sent to Dr. [name].    3 month ICM trend: 09/30/2022.    12-14 Month ICM trend:     [name], RN 10/04/2022 3:33 PM

## 2022-10-09 NOTE — Progress Notes (Signed)
Remote ICD transmission.   

## 2022-10-22 ENCOUNTER — Other Ambulatory Visit: Payer: Self-pay | Admitting: *Deleted

## 2022-10-22 MED ORDER — EMPAGLIFLOZIN 10 MG PO TABS: 10.0000 mg | ORAL_TABLET | Freq: Every day | ORAL | 3 refills | Status: DC

## 2022-10-30 NOTE — Progress Notes (Deleted)
   Tracy Mills, female    DOB: January 27, 1946    MRN: [id number]   Brief patient profile:  ***  yo*** *** referred to pulmonary clinic in [place]  10/31/2022 by *** for ***      History of Present Illness  10/31/2022  Pulmonary/ 1st office eval/ Sherene S[facility] / [facility]  No chief complaint on file.    Dyspnea:  *** Cough: *** Sleep: *** SABA use: *** 02: *** Lung cancer screen: ***  Past Medical History:  Diagnosis Date   Chronic systolic congestive heart failure (HCC)    Coronary artery disease    Anterior MI in 2010. LAD stent placement 2010, [place].   Essential hypertension    History of ventricular fibrillation 2013   Ischemic cardiomyopathy    LBBB (left bundle branch block)    MR (mitral regurgitation)     Outpatient Medications Prior to Visit  Medication Sig Dispense Refill   acetaminophen (TYLENOL) 500 MG tablet Take 1,000 mg by mouth every 6 (six) hours as needed for moderate pain.      aspirin 81 MG tablet Take 81 mg by mouth daily.     carvedilol (COREG) 3.125 MG tablet TAKE 1 TABLET TWICE A DAY 180 tablet 1   carvedilol (COREG) 6.25 MG tablet Take 1 tablet (6.25 mg total) by mouth 2 (two) times daily. 60 tablet 6   cetirizine (ZYRTEC) 10 MG tablet Take 10 mg by mouth daily as needed for allergies.      colchicine 0.6 MG tablet Take 0.6 mg by mouth daily as needed (gout).     empagliflozin (JARDIANCE) 10 MG TABS tablet Take 1 tablet (10 mg total) by mouth daily before breakfast. 90 tablet 3   furosemide (LASIX) 20 MG tablet Take 1-2 tablets (20-40 mg total) by mouth daily. Alternated 20 mg and 40 mg every other day 135 tablet 3   Naphazoline-Pheniramine (ALLERGY EYE OP) Place 1 drop into both eyes daily as needed (itchy eyes/allergies).     nitroGLYCERIN (NITROSTAT) 0.4 MG SL tablet DISSOLVE 1 TABLET UNDER THE TONGUE EVERY 5 MINUTES UP TO 3 DOSES AS NEEDED FOR CHEST PAIN. IF NO RELIEF CALL 911 OR GO TO ER (Patient taking differently: Place 0.4 mg under the  tongue every 5 (five) minutes x 3 doses as needed for chest pain.) 25 tablet 3   pantoprazole (PROTONIX) 40 MG tablet Take 40 mg by mouth daily as needed (heartburn).     rosuvastatin (CRESTOR) 20 MG tablet TAKE 1 TABLET DAILY 90 tablet 1   sacubitril-valsartan (ENTRESTO) 24-26 MG TAKE 1 TABLET TWICE A DAY 180 tablet 2   spironolactone (ALDACTONE) 25 MG tablet TAKE 1 TABLET DAILY 90 tablet 3   triamcinolone cream (KENALOG) 0.5 % Apply 1 application topically 2 (two) times daily as needed (rash/irritation).     No facility-administered medications prior to visit.     Objective:     There were no vitals taken for this visit.         Assessment   No problem-specific Assessment & Plan notes found for this encounter.     Sandrea Hughs, MD 10/30/2022

## 2022-10-31 ENCOUNTER — Institutional Professional Consult (permissible substitution): Payer: Medicare HMO | Admitting: Internal Medicine

## 2022-11-04 ENCOUNTER — Ambulatory Visit: Payer: Medicare HMO | Attending: Cardiovascular Disease

## 2022-11-04 DIAGNOSIS — I5022 Chronic systolic (congestive) heart failure: Secondary | ICD-10-CM | POA: Diagnosis not present

## 2022-11-04 DIAGNOSIS — Z95810 Presence of automatic (implantable) cardiac defibrillator: Secondary | ICD-10-CM | POA: Diagnosis not present

## 2022-11-06 ENCOUNTER — Telehealth: Payer: Self-pay

## 2022-11-06 NOTE — Telephone Encounter (Signed)
Remote ICM transmission received.  Attempted call to patient regarding ICM remote transmission and no answer.  No voice mail set up.

## 2022-11-06 NOTE — Progress Notes (Signed)
EPIC Encounter for ICM Monitoring  Patient Name: Tracy Mills is a 77 y.o. female Date: 11/06/2022 Primary Care Physican: Ignatius Specking, MD Primary Cardiologist: Diona Browner Electrophysiologist: Mealor Bi-V Pacing:  93.4%    08/30/2022 Weight: 128 lbs 10/04/2022 Weight: 128 lbs   Since 30-Sep-2022 Time in AT/AF   0.0 hr/day (0.0%)         Attempted call to patient and unable to reach.   Transmission reviewed.           Optivol Thoracic impedance suggesting intermittent days with possible fluid accumulation within the last month.   Prescribed: Furosemide 20 mg Take 1-2 tablets (20-40 mg total) by mouth daily. Alternated 20 mg and 40 mg every other day.  10/04/2022 pt reports taking 1 tablet daily Spironolactone 25 mg take 1 tablet daily Jardiance 10 mg take 1 tablet by mouth daily before breakfast   Labs: 02/13/2022 Creatinine 1.14, BUN 7,   Potassium 4.0, Sodium 139, GFR 50 05/22/2021 Creatinine 1.17, BUN 15, Potassium 3.8, Sodium 138, GFR 49 A complete set of results can be found in Results Review.   Recommendations: Unable to reach.     Follow-up plan: ICM clinic phone appointment on 12/09/2022.   91 day device clinic remote transmission 11/26/2022.     EP/Cardiology Office Visits:.  Recall 02/23/2023 with Dr Diona Browner.    07/04/2023 with Dr [name].     Copy of ICM check sent to Dr. [name].  3 month ICM trend: 11/04/2022.    12-14 Month ICM trend:     [name], RN 11/06/2022 12:56 PM

## 2022-11-07 ENCOUNTER — Telehealth: Payer: Self-pay | Admitting: Cardiology

## 2022-11-07 MED ORDER — CARVEDILOL 3.125 MG PO TABS: 3.1250 mg | ORAL_TABLET | Freq: Two times a day (BID) | ORAL | 0 refills | Status: DC

## 2022-11-07 NOTE — Telephone Encounter (Signed)
*  STAT* If patient is at the pharmacy, call can be transferred to refill team.   1. Which medications need to be refilled? (please list name of each medication and dose if known) carvedilol (COREG) 3.125 MG tablet   2. Which pharmacy/location (including street and city if local pharmacy) is medication to be sent to? [facility] in [address], [place] phone [phone]  3. Do they need a 30 day or 90 day supply? 30 day  Patient is in [place] and she left her pills at home, she needs a script sent to the [facility] in [place].  Patient would like a call when this done.

## 2022-11-18 ENCOUNTER — Telehealth: Payer: Self-pay | Admitting: Cardiovascular Disease

## 2022-11-18 NOTE — Telephone Encounter (Signed)
Spoke with patient - is not having active nose bleed - had issue this morning, but had never happened to her before.  She is not on any true blood thinners, only ASA.  She does state that he nose has been running & possibly could be allergies.  Informed her that sometimes you can get a little broken capillary form blowing too hard or from dryness due to allergies.  Suggested that she may use saline nasal wash as needed.  And if she should have nose bleed in the future she can use Afrin NS - small amount on cotton ball and place in that nostril.  If that does not stop the bleeding, then she should go to ED as they may need to cauterize the area.  She verbalized understanding.

## 2022-11-18 NOTE — Telephone Encounter (Signed)
Patient said that she has been experiencing nose bleeds and want to know a solutions to make it stop

## 2022-11-25 ENCOUNTER — Telehealth: Payer: Self-pay | Admitting: Cardiology

## 2022-11-25 NOTE — Telephone Encounter (Signed)
I did not need this encounter. °

## 2022-11-26 ENCOUNTER — Ambulatory Visit: Payer: Medicare HMO

## 2022-11-26 DIAGNOSIS — I255 Ischemic cardiomyopathy: Secondary | ICD-10-CM | POA: Diagnosis not present

## 2022-11-26 LAB — CUP PACEART REMOTE DEVICE CHECK
Battery Remaining Longevity: 68 mo
Battery Voltage: 2.98 V
Brady Statistic AP VP Percent: 84.97 %
Brady Statistic AP VS Percent: 1.75 %
Brady Statistic AS VP Percent: 13.08 %
Brady Statistic AS VS Percent: 0.2 %
Brady Statistic RA Percent Paced: 85.14 %
Brady Statistic RV Percent Paced: 8.95 %
Date Time Interrogation Session: 20240702063428
HighPow Impedance: 45 Ohm
HighPow Impedance: 54 Ohm
Implantable Lead Connection Status: 753985
Implantable Lead Connection Status: 753985
Implantable Lead Connection Status: 753985
Implantable Lead Implant Date: 20110119
Implantable Lead Implant Date: 20110119
Implantable Lead Implant Date: 20150623
Implantable Lead Location: 753858
Implantable Lead Location: 753859
Implantable Lead Location: 753860
Implantable Lead Model: 4598
Implantable Lead Model: 5076
Implantable Lead Model: 6947
Implantable Pulse Generator Implant Date: 20210930
Lead Channel Impedance Value: 1007 Ohm
Lead Channel Impedance Value: 1026 Ohm
Lead Channel Impedance Value: 205.114
Lead Channel Impedance Value: 211.891
Lead Channel Impedance Value: 233.981
Lead Channel Impedance Value: 277.083
Lead Channel Impedance Value: 289.597
Lead Channel Impedance Value: 361 Ohm
Lead Channel Impedance Value: 361 Ohm
Lead Channel Impedance Value: 418 Ohm
Lead Channel Impedance Value: 456 Ohm
Lead Channel Impedance Value: 475 Ohm
Lead Channel Impedance Value: 513 Ohm
Lead Channel Impedance Value: 646 Ohm
Lead Channel Impedance Value: 665 Ohm
Lead Channel Impedance Value: 703 Ohm
Lead Channel Impedance Value: 760 Ohm
Lead Channel Impedance Value: 931 Ohm
Lead Channel Pacing Threshold Amplitude: 0.5 V
Lead Channel Pacing Threshold Amplitude: 1.125 V
Lead Channel Pacing Threshold Amplitude: 1.25 V
Lead Channel Pacing Threshold Pulse Width: 0.4 ms
Lead Channel Pacing Threshold Pulse Width: 0.4 ms
Lead Channel Pacing Threshold Pulse Width: 0.8 ms
Lead Channel Sensing Intrinsic Amplitude: 1.25 mV
Lead Channel Sensing Intrinsic Amplitude: 1.25 mV
Lead Channel Sensing Intrinsic Amplitude: 24.375 mV
Lead Channel Sensing Intrinsic Amplitude: 24.375 mV
Lead Channel Setting Pacing Amplitude: 2 V
Lead Channel Setting Pacing Amplitude: 2.25 V
Lead Channel Setting Pacing Amplitude: 2.5 V
Lead Channel Setting Pacing Pulse Width: 0.4 ms
Lead Channel Setting Pacing Pulse Width: 0.8 ms
Lead Channel Setting Sensing Sensitivity: 0.45 mV
Zone Setting Status: 755011
Zone Setting Status: 755011

## 2022-12-02 ENCOUNTER — Other Ambulatory Visit: Payer: Self-pay

## 2022-12-02 DIAGNOSIS — E785 Hyperlipidemia, unspecified: Secondary | ICD-10-CM

## 2022-12-02 MED ORDER — ROSUVASTATIN CALCIUM 20 MG PO TABS
20.0000 mg | ORAL_TABLET | Freq: Every day | ORAL | 1 refills | Status: DC
Start: 2022-12-02 — End: 2023-04-23

## 2022-12-14 ENCOUNTER — Other Ambulatory Visit: Payer: Self-pay | Admitting: Cardiovascular Disease

## 2022-12-14 DIAGNOSIS — I10 Essential (primary) hypertension: Secondary | ICD-10-CM

## 2022-12-14 DIAGNOSIS — I5022 Chronic systolic (congestive) heart failure: Secondary | ICD-10-CM

## 2022-12-14 DIAGNOSIS — I472 Ventricular tachycardia, unspecified: Secondary | ICD-10-CM

## 2022-12-15 NOTE — Progress Notes (Signed)
No ICM remote transmission received for 12/09/2022 and next ICM transmission scheduled for 01/06/2023.

## 2022-12-20 NOTE — Progress Notes (Signed)
Remote ICD transmission.   

## 2023-01-06 ENCOUNTER — Ambulatory Visit: Payer: Medicare HMO | Attending: Cardiovascular Disease

## 2023-01-06 DIAGNOSIS — I5022 Chronic systolic (congestive) heart failure: Secondary | ICD-10-CM | POA: Diagnosis not present

## 2023-01-06 DIAGNOSIS — Z95810 Presence of automatic (implantable) cardiac defibrillator: Secondary | ICD-10-CM | POA: Diagnosis not present

## 2023-01-06 DIAGNOSIS — Z0000 Encounter for general adult medical examination without abnormal findings: Secondary | ICD-10-CM | POA: Diagnosis not present

## 2023-01-06 DIAGNOSIS — Z299 Encounter for prophylactic measures, unspecified: Secondary | ICD-10-CM | POA: Diagnosis not present

## 2023-01-06 DIAGNOSIS — F17210 Nicotine dependence, cigarettes, uncomplicated: Secondary | ICD-10-CM | POA: Diagnosis not present

## 2023-01-06 DIAGNOSIS — I25700 Atherosclerosis of coronary artery bypass graft(s), unspecified, with unstable angina pectoris: Secondary | ICD-10-CM | POA: Diagnosis not present

## 2023-01-06 DIAGNOSIS — I10 Essential (primary) hypertension: Secondary | ICD-10-CM | POA: Diagnosis not present

## 2023-01-06 DIAGNOSIS — I509 Heart failure, unspecified: Secondary | ICD-10-CM | POA: Diagnosis not present

## 2023-01-07 NOTE — Progress Notes (Signed)
EPIC Encounter for ICM Monitoring  Patient Name: Tracy Mills is a 77 y.o. female Date: 01/07/2023 Primary Care Physican: Ignatius Specking, MD Primary Cardiologist: Diona Browner Electrophysiologist: Mealor Bi-V Pacing:  93.5%    08/30/2022 Weight: 128 lbs 10/04/2022 Weight: 128 lbs 01/07/2023 Weight: 128 lbs   Since 26-Nov-2022 Time in AT/AF   0.0 hr/day (0.0%)         Spoke with patient and heart failure questions reviewed.  Transmission results reviewed.  Pt asymptomatic for fluid accumulation.  Reports feeling well at this time and voices no complaints.            Optivol Thoracic impedance suggesting possible fluid accumulation starting 7/22.   Prescribed: Furosemide 20 mg Take 1-2 tablets (20-40 mg total) by mouth daily. Alternated 20 mg and 40 mg every other day.  01/07/2023 pt reports taking 1 tablet daily Spironolactone 25 mg take 1 tablet daily Jardiance 10 mg take 1 tablet by mouth daily before breakfast   Labs: 02/13/2022 Creatinine 1.14, BUN 7,   Potassium 4.0, Sodium 139, GFR 50 05/22/2021 Creatinine 1.17, BUN 15, Potassium 3.8, Sodium 138, GFR 49 A complete set of results can be found in Results Review.   Recommendations: Advised to take Furosemide 2 tablets daily x 2 days and then return to prescribed dosage.     Follow-up plan: ICM clinic phone appointment on 01/14/2023.   91 day device clinic remote transmission 02/25/2023.     EP/Cardiology Office Visits:.  Recall 02/23/2023 with Dr Diona Browner.    07/11/2023 with Dr [name].     Copy of ICM check sent to Dr. [name].  3 month ICM trend: 01/06/2023.    12-14 Month ICM trend:     [name], RN 01/07/2023 3:26 PM

## 2023-01-14 ENCOUNTER — Ambulatory Visit: Payer: Medicare HMO

## 2023-01-14 DIAGNOSIS — Z95810 Presence of automatic (implantable) cardiac defibrillator: Secondary | ICD-10-CM

## 2023-01-14 DIAGNOSIS — I5022 Chronic systolic (congestive) heart failure: Secondary | ICD-10-CM

## 2023-01-15 NOTE — Progress Notes (Signed)
EPIC Encounter for ICM Monitoring  Patient Name: Tracy Mills is a 77 y.o. female Date: 01/15/2023 Primary Care Physican: Ignatius Specking, MD Primary Cardiologist: Diona Browner Electrophysiologist: Mealor Bi-V Pacing:  91.7%    08/30/2022 Weight: 128 lbs 10/04/2022 Weight: 128 lbs 01/07/2023 Weight: 128 lbs   Clinical Status Since 06-Jan-2023 Time in AT/AF   0.0 hr/day (0.0%)         Spoke with patient and heart failure questions reviewed.  Transmission results reviewed.  Pt asymptomatic for fluid accumulation.  Reports feeling well at this time and voices no complaints.            Optivol Thoracic impedance suggesting fluid levels returned to normal after taking 2 Furosemide tablets daily x 2 days.   Prescribed: Furosemide 20 mg Take 1-2 tablets (20-40 mg total) by mouth daily. Alternated 20 mg and 40 mg every other day.  01/07/2023 pt reports taking 1 tablet daily Spironolactone 25 mg take 1 tablet daily Jardiance 10 mg take 1 tablet by mouth daily before breakfast   Labs: 02/13/2022 Creatinine 1.14, BUN 7,   Potassium 4.0, Sodium 139, GFR 50 05/22/2021 Creatinine 1.17, BUN 15, Potassium 3.8, Sodium 138, GFR 49 A complete set of results can be found in Results Review.   Recommendations: No changes and encouraged to call if experiencing any fluid symptoms.   Follow-up plan: ICM clinic phone appointment on 02/10/2023.   91 day device clinic remote transmission 02/25/2023.     EP/Cardiology Office Visits:.  Recall 02/23/2023 with Dr Diona Browner.    07/11/2023 with Dr [name].     Copy of ICM check sent to Dr. [name].  3 month ICM trend: 01/13/2023.    12-14 Month ICM trend:     [name], RN 01/15/2023 4:05 PM

## 2023-02-10 ENCOUNTER — Ambulatory Visit: Payer: Medicare HMO | Attending: Cardiovascular Disease

## 2023-02-10 DIAGNOSIS — I5022 Chronic systolic (congestive) heart failure: Secondary | ICD-10-CM | POA: Diagnosis not present

## 2023-02-10 DIAGNOSIS — Z95810 Presence of automatic (implantable) cardiac defibrillator: Secondary | ICD-10-CM | POA: Diagnosis not present

## 2023-02-13 ENCOUNTER — Telehealth: Payer: Self-pay

## 2023-02-13 NOTE — Progress Notes (Signed)
EPIC Encounter for ICM Monitoring  Patient Name: Tracy Mills is a 77 y.o. female Date: 02/13/2023 Primary Care Physican: Ignatius Specking, MD Primary Cardiologist: Diona Browner Electrophysiologist: Mealor Bi-V Pacing:  93%    08/30/2022 Weight: 128 lbs 10/04/2022 Weight: 128 lbs 01/07/2023 Weight: 128 lbs   Clinical Status Since 14-Jan-2023 Time in AT/AF   0.0 hr/day (0.0%)         Attempted call to patient and unable to reach.   Transmission reviewed.          Optivol Thoracic impedance suggesting intermittent days with possible fluid accumulation within the last month.   Prescribed: Furosemide 20 mg Take 1-2 tablets (20-40 mg total) by mouth daily. Alternated 20 mg and 40 mg every other day.  01/07/2023 pt reports taking 1 tablet daily Spironolactone 25 mg take 1 tablet daily Jardiance 10 mg take 1 tablet by mouth daily before breakfast   Labs: 02/13/2022 Creatinine 1.14, BUN 7,   Potassium 4.0, Sodium 139, GFR 50 05/22/2021 Creatinine 1.17, BUN 15, Potassium 3.8, Sodium 138, GFR 49 A complete set of results can be found in Results Review.   Recommendations:   Unable to reach.     Follow-up plan: ICM clinic phone appointment on 03/17/2023.   91 day device clinic remote transmission 02/25/2023.     EP/Cardiology Office Visits:.  Recall 02/23/2023 with Dr Diona Browner.    07/11/2023 with Dr [name].     Copy of ICM check sent to Dr. [name].  3 month ICM trend: 02/10/2023.    12-14 Month ICM trend:     [name], RN 02/13/2023 12:19 PM

## 2023-02-13 NOTE — Telephone Encounter (Signed)
Remote ICM transmission received.  Attempted call to patient on both home and cell phone numbers regarding ICM remote transmission and no answer or voice mail option.

## 2023-02-25 ENCOUNTER — Ambulatory Visit (INDEPENDENT_AMBULATORY_CARE_PROVIDER_SITE_OTHER): Payer: Medicare HMO

## 2023-02-25 DIAGNOSIS — I255 Ischemic cardiomyopathy: Secondary | ICD-10-CM

## 2023-02-25 DIAGNOSIS — I5022 Chronic systolic (congestive) heart failure: Secondary | ICD-10-CM | POA: Diagnosis not present

## 2023-02-26 LAB — CUP PACEART REMOTE DEVICE CHECK
Battery Remaining Longevity: 63 mo
Battery Voltage: 2.99 V
Brady Statistic AP VP Percent: 69.76 %
Brady Statistic AP VS Percent: 2.56 %
Brady Statistic AS VP Percent: 27.26 %
Brady Statistic AS VS Percent: 0.42 %
Brady Statistic RA Percent Paced: 72.27 %
Brady Statistic RV Percent Paced: 9.31 %
Date Time Interrogation Session: 20241002124723
HighPow Impedance: 46 Ohm
HighPow Impedance: 56 Ohm
Implantable Lead Connection Status: 753985
Implantable Lead Connection Status: 753985
Implantable Lead Connection Status: 753985
Implantable Lead Implant Date: 20110119
Implantable Lead Implant Date: 20110119
Implantable Lead Implant Date: 20150623
Implantable Lead Location: 753858
Implantable Lead Location: 753859
Implantable Lead Location: 753860
Implantable Lead Model: 4598
Implantable Lead Model: 5076
Implantable Lead Model: 6947
Implantable Pulse Generator Implant Date: 20210930
Lead Channel Impedance Value: 1083 Ohm
Lead Channel Impedance Value: 1121 Ohm
Lead Channel Impedance Value: 215.064
Lead Channel Impedance Value: 218.104
Lead Channel Impedance Value: 233.981
Lead Channel Impedance Value: 295.556
Lead Channel Impedance Value: 301.328
Lead Channel Impedance Value: 342 Ohm
Lead Channel Impedance Value: 361 Ohm
Lead Channel Impedance Value: 399 Ohm
Lead Channel Impedance Value: 456 Ohm
Lead Channel Impedance Value: 532 Ohm
Lead Channel Impedance Value: 551 Ohm
Lead Channel Impedance Value: 665 Ohm
Lead Channel Impedance Value: 665 Ohm
Lead Channel Impedance Value: 779 Ohm
Lead Channel Impedance Value: 817 Ohm
Lead Channel Impedance Value: 988 Ohm
Lead Channel Pacing Threshold Amplitude: 0.375 V
Lead Channel Pacing Threshold Amplitude: 1.125 V
Lead Channel Pacing Threshold Amplitude: 1.375 V
Lead Channel Pacing Threshold Pulse Width: 0.4 ms
Lead Channel Pacing Threshold Pulse Width: 0.4 ms
Lead Channel Pacing Threshold Pulse Width: 0.8 ms
Lead Channel Sensing Intrinsic Amplitude: 1.125 mV
Lead Channel Sensing Intrinsic Amplitude: 1.125 mV
Lead Channel Sensing Intrinsic Amplitude: 16 mV
Lead Channel Sensing Intrinsic Amplitude: 16 mV
Lead Channel Setting Pacing Amplitude: 2 V
Lead Channel Setting Pacing Amplitude: 2.25 V
Lead Channel Setting Pacing Amplitude: 2.75 V
Lead Channel Setting Pacing Pulse Width: 0.4 ms
Lead Channel Setting Pacing Pulse Width: 0.8 ms
Lead Channel Setting Sensing Sensitivity: 0.45 mV
Zone Setting Status: 755011
Zone Setting Status: 755011

## 2023-03-13 NOTE — Progress Notes (Signed)
Remote ICD transmission.   

## 2023-03-17 ENCOUNTER — Ambulatory Visit: Payer: Medicare HMO | Attending: Cardiovascular Disease

## 2023-03-17 DIAGNOSIS — I5022 Chronic systolic (congestive) heart failure: Secondary | ICD-10-CM

## 2023-03-17 DIAGNOSIS — Z95810 Presence of automatic (implantable) cardiac defibrillator: Secondary | ICD-10-CM | POA: Diagnosis not present

## 2023-03-20 NOTE — Progress Notes (Signed)
EPIC Encounter for ICM Monitoring  Patient Name: Tracy Mills is a 77 y.o. female Date: 03/20/2023 Primary Care Physican: Ignatius Specking, MD Primary Cardiologist: Diona Browner Electrophysiologist: Mealor Bi-V Pacing:  90.8%    03/20/2023 Weight: 128 lbs   Clinical Status (26-Feb-2023 to 17-Mar-2023) Time in AT/AF   0.0 hr/day (0.0%)         Spoke with patient and heart failure questions reviewed.  Transmission results reviewed.  Pt asymptomatic for fluid accumulation.  Reports feeling well at this time and voices no complaints.           Optivol Thoracic impedance suggesting possible fluid accumulation with the exception of possible fluid accumulation from 9/23-10/4.   Prescribed: Furosemide 20 mg Take 1-2 tablets (20-40 mg total) by mouth daily. Alternated 20 mg and 40 mg every other day.  01/07/2023 pt reports taking 1 tablet daily Spironolactone 25 mg take 1 tablet daily Jardiance 10 mg take 1 tablet by mouth daily before breakfast   Labs: 02/13/2022 Creatinine 1.14, BUN 7,   Potassium 4.0, Sodium 139, GFR 50 05/22/2021 Creatinine 1.17, BUN 15, Potassium 3.8, Sodium 138, GFR 49 A complete set of results can be found in Results Review.   Recommendations:   No changes and encouraged to call if experiencing any fluid symptoms.   Follow-up plan: ICM clinic phone appointment on 04/21/2023.   91 day device clinic remote transmission 05/27/2023.     EP/Cardiology Office Visits:.  04/23/2023 with Dr Diona Browner.    07/11/2023 with Dr [name].     Copy of ICM check sent to Dr. [name].  3 month ICM trend: 03/17/2023.    12-14 Month ICM trend:     [name], RN 03/20/2023 3:05 PM

## 2023-04-21 ENCOUNTER — Telehealth: Payer: Self-pay

## 2023-04-21 ENCOUNTER — Ambulatory Visit: Payer: Medicare HMO | Attending: Cardiovascular Disease

## 2023-04-21 DIAGNOSIS — I5022 Chronic systolic (congestive) heart failure: Secondary | ICD-10-CM

## 2023-04-21 DIAGNOSIS — Z95810 Presence of automatic (implantable) cardiac defibrillator: Secondary | ICD-10-CM

## 2023-04-21 NOTE — Progress Notes (Signed)
EPIC Encounter for ICM Monitoring  Patient Name: Tracy Mills is a 77 y.o. female Date: 04/21/2023 Primary Care Physican: Ignatius Specking, MD Primary Cardiologist: Diona Browner Electrophysiologist: Mealor Bi-V Pacing:  90.3%    03/20/2023 Weight: 128 lbs   Clinical Status Since 17-Mar-2023 Time in AT/AF   <0.1 hr/day (<0.1%))         Attempted call to patient and unable to reach.   Transmission reviewed.            Optivol Thoracic impedance suggesting normal fluid levels with the exception of possible fluid accumulation from 11/5-11/10.   Prescribed: Furosemide 20 mg Take 1-2 tablets (20-40 mg total) by mouth daily. Alternated 20 mg and 40 mg every other day.  01/07/2023 pt reports taking 1 tablet daily Spironolactone 25 mg take 1 tablet daily Jardiance 10 mg take 1 tablet by mouth daily before breakfast   Labs: 02/13/2022 Creatinine 1.14, BUN 7,   Potassium 4.0, Sodium 139, GFR 50 05/22/2021 Creatinine 1.17, BUN 15, Potassium 3.8, Sodium 138, GFR 49 A complete set of results can be found in Results Review.   Recommendations:   Unable to reach.     Follow-up plan: ICM clinic phone appointment on 05/26/2023.   91 day device clinic remote transmission 05/27/2023.     EP/Cardiology Office Visits:.  04/23/2023 with Dr Diona Browner.    07/11/2023 with Dr [name].     Copy of ICM check sent to Dr. [name].   3 month ICM trend: 04/21/2023.    12-14 Month ICM trend:     [name], RN 04/21/2023 2:44 PM

## 2023-04-21 NOTE — Telephone Encounter (Signed)
Remote ICM transmission received.  Attempted call to patient on cell and home number regarding ICM remote transmission and left no answer or voice mail option.

## 2023-04-23 ENCOUNTER — Telehealth: Payer: Self-pay | Admitting: Cardiology

## 2023-04-23 ENCOUNTER — Encounter: Payer: Self-pay | Admitting: Cardiology

## 2023-04-23 ENCOUNTER — Ambulatory Visit: Payer: Medicare HMO | Attending: Cardiology | Admitting: Cardiology

## 2023-04-23 VITALS — BP 110/70 | HR 68 | Ht 63.0 in | Wt 122.4 lb

## 2023-04-23 DIAGNOSIS — I25119 Atherosclerotic heart disease of native coronary artery with unspecified angina pectoris: Secondary | ICD-10-CM

## 2023-04-23 DIAGNOSIS — I340 Nonrheumatic mitral (valve) insufficiency: Secondary | ICD-10-CM | POA: Diagnosis not present

## 2023-04-23 DIAGNOSIS — I5020 Unspecified systolic (congestive) heart failure: Secondary | ICD-10-CM

## 2023-04-23 DIAGNOSIS — E782 Mixed hyperlipidemia: Secondary | ICD-10-CM | POA: Diagnosis not present

## 2023-04-23 DIAGNOSIS — R053 Chronic cough: Secondary | ICD-10-CM | POA: Diagnosis not present

## 2023-04-23 MED ORDER — ROSUVASTATIN CALCIUM 40 MG PO TABS: 40.0000 mg | ORAL_TABLET | Freq: Every day | ORAL | 2 refills | Status: DC

## 2023-04-23 NOTE — Telephone Encounter (Signed)
Checking percert on the following patient   CT CHEST WO CONTRAST   **Patient has requested to have procedure done at [facility])  They will not schedule without percert.

## 2023-04-23 NOTE — Progress Notes (Signed)
Cardiology Office Note  Date: 04/23/2023   ID: Tracy Mills, DOB [birth year], MRN [id number]  History of Present Illness: Tracy Mills is a 77 y.o. female last seen in April by [name] NP, I reviewed the note.  She is here for a follow-up visit.  Reports NYHA class II dyspnea, no exertional chest pain, no palpitations or syncope.  She describes a chronic cough, mildly productive of phlegm.  No hemoptysis.  No unusual weight loss.  She quit smoking about a year ago.  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 [name].  Device interrogation in October revealed normal function.  No device shocks.  I reviewed her medications.  Current cardiac regimen includes aspirin, Coreg, Jardiance, Lasix, Aldactone, Entresto, Crestor, and as needed nitroglycerin.  I reviewed her lab work, LDL was 87 in April of this year.  Physical Exam: VS:  BP 110/70   Pulse 68   Ht 5\' 3"  (1.6 m)   Wt 122 lb 6.4 oz (55.5 kg)   SpO2 96%   BMI 21.68 kg/m , BMI Body mass index is 21.68 kg/m.  Wt Readings from Last 3 Encounters:  04/23/23 122 lb 6.4 oz (55.5 kg)  08/27/22 127 lb 3.2 oz (57.7 kg)  07/11/22 126 lb (57.2 kg)    General: Patient appears comfortable at rest. HEENT: Conjunctiva and lids normal. Neck: Supple, no elevated JVP or carotid bruits. Lungs: Decreased breath sounds without wheezing or rhonchi, no crackles. Cardiac: Regular rate and rhythm, no S3 or significant systolic murmur. Extremities: No pitting edema.  ECG:  An ECG dated 07/11/2022 was personally reviewed today and demonstrated:  Dual-chamber pacing.  Labwork:  April 2024: Hemoglobin 13, platelets 285, BUN 15, creatinine 1.19, potassium 4.4, AST 15, ALT 6, cholesterol 188, triglycerides 77, HDL 87, LDL 87, TSH 1.8  Other Studies Reviewed Today:  Echocardiogram 09/23/2022:  1. Left ventricular ejection fraction, by estimation, is 30 to 35%. The  left ventricle has moderately decreased function. The left  ventricle  demonstrates regional wall motion abnormalities (see scoring  diagram/findings for description). There is mild  asymmetric left ventricular hypertrophy of the basal segment. Left  ventricular diastolic parameters are consistent with Grade I diastolic  dysfunction (impaired relaxation). The average left ventricular global  longitudinal strain is -13.0 %. The global  longitudinal strain is abnormal.   2. Right ventricular systolic function is normal. The right ventricular  size is normal. There is normal pulmonary artery systolic pressure. The  estimated right ventricular systolic pressure is 29.8 mmHg.   3. The mitral valve is grossly normal. Mild to moderate mitral valve  regurgitation.   4. The aortic valve is tricuspid. Aortic valve regurgitation is not  visualized. Aortic valve sclerosis is present, with no evidence of aortic  valve stenosis. Aortic valve mean gradient measures 4.0 mmHg.   5. The inferior vena cava is normal in size with greater than 50%  respiratory variability, suggesting right atrial pressure of 3 mmHg.   Assessment and Plan:  1.  HFrEF with ischemic cardiomyopathy, LVEF 30 to 35% by echocardiogram in April of this year.  Reports NYHA class II dyspnea, no fluid retention.  Continue medical therapy including Coreg, Jardiance, Aldactone, Entresto, and Lasix.  2.  Chronic cough, mildly productive as discussed above.  Has prior tobacco use history, quit 1 year ago.  Plan to check screening chest CT.   3.  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 [name].  No device shocks  or syncope.  4.  Mitral regurgitation, mild to moderate by echocardiogram in April of this year.   5.  CAD status post anterior infarct in 2010 treated with stent intervention of the LAD.  No active angina.  Continue aspirin, Crestor, and as needed nitroglycerin.  6.  Primary hypertension.  Blood pressure is well-controlled today on current regimen.  No changes were  made.  7.  Mixed hyperlipidemia, LDL 87 in April of this year.  Increase Crestor to 40 mg daily.  Disposition:  Follow up  6 months.  Signed, [name], M.D., F.A.C.C. [facility]

## 2023-04-23 NOTE — Patient Instructions (Addendum)
Medication Instructions:  Your physician has recommended you make the following change in your medication:  Increase rosuvastatin to 40 mg daily Continue all other medications as prescribed  Labwork: none  Testing/Procedures: Chest CT at [facility]  Follow-Up: Your physician recommends that you schedule a follow-up appointment in: 6 months  Any Other Special Instructions Will Be Listed Below (If Applicable).  If you need a refill on your cardiac medications before your next appointment, please call your pharmacy.

## 2023-05-07 DIAGNOSIS — I7123 Aneurysm of the descending thoracic aorta, without rupture: Secondary | ICD-10-CM | POA: Diagnosis not present

## 2023-05-07 DIAGNOSIS — R918 Other nonspecific abnormal finding of lung field: Secondary | ICD-10-CM | POA: Diagnosis not present

## 2023-05-07 DIAGNOSIS — R053 Chronic cough: Secondary | ICD-10-CM | POA: Diagnosis not present

## 2023-05-09 ENCOUNTER — Encounter: Payer: Self-pay | Admitting: *Deleted

## 2023-05-16 ENCOUNTER — Other Ambulatory Visit: Payer: Self-pay | Admitting: *Deleted

## 2023-05-16 DIAGNOSIS — R053 Chronic cough: Secondary | ICD-10-CM

## 2023-05-23 ENCOUNTER — Telehealth: Payer: Self-pay | Admitting: Cardiology

## 2023-05-23 NOTE — Telephone Encounter (Signed)
Pt returning call for Ct results

## 2023-05-26 ENCOUNTER — Telehealth: Payer: Self-pay

## 2023-05-26 ENCOUNTER — Ambulatory Visit: Payer: Medicare HMO | Attending: Cardiovascular Disease

## 2023-05-26 DIAGNOSIS — I5022 Chronic systolic (congestive) heart failure: Secondary | ICD-10-CM

## 2023-05-26 DIAGNOSIS — Z95810 Presence of automatic (implantable) cardiac defibrillator: Secondary | ICD-10-CM

## 2023-05-26 DIAGNOSIS — R053 Chronic cough: Secondary | ICD-10-CM

## 2023-05-26 NOTE — Telephone Encounter (Signed)
-----   Message from Tracy Mills sent at 05/16/2023  9:08 AM EST ----- Results reviewed.  Chest CT shows question of chronic bronchitis, not able to determine infectious versus inflammatory, but she has had a chronic cough per symptom review at last visit.  No pneumonia.  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  The aortic findings can be reimaged in 1 year.  Given her chronic cough and CT findings, would get her scheduled for Pulmonary evaluation at [facility].

## 2023-05-26 NOTE — Telephone Encounter (Signed)
Patient informed and verbalized understanding of plan. 

## 2023-05-26 NOTE — Telephone Encounter (Signed)
Unable to leave message. No answer

## 2023-05-27 ENCOUNTER — Ambulatory Visit (INDEPENDENT_AMBULATORY_CARE_PROVIDER_SITE_OTHER): Payer: Medicare HMO

## 2023-05-27 DIAGNOSIS — I255 Ischemic cardiomyopathy: Secondary | ICD-10-CM

## 2023-05-27 DIAGNOSIS — I5022 Chronic systolic (congestive) heart failure: Secondary | ICD-10-CM | POA: Diagnosis not present

## 2023-05-29 LAB — CUP PACEART REMOTE DEVICE CHECK
Battery Remaining Longevity: 62 mo
Battery Voltage: 2.96 V
Brady Statistic AP VP Percent: 57.28 %
Brady Statistic AP VS Percent: 2.73 %
Brady Statistic AS VP Percent: 39.2 %
Brady Statistic AS VS Percent: 0.8 %
Brady Statistic RA Percent Paced: 59.84 %
Brady Statistic RV Percent Paced: 1.41 %
Date Time Interrogation Session: 20250101024347
HighPow Impedance: 49 Ohm
HighPow Impedance: 61 Ohm
Implantable Lead Connection Status: 753985
Implantable Lead Connection Status: 753985
Implantable Lead Connection Status: 753985
Implantable Lead Implant Date: 20110119
Implantable Lead Implant Date: 20110119
Implantable Lead Implant Date: 20150623
Implantable Lead Location: 753858
Implantable Lead Location: 753859
Implantable Lead Location: 753860
Implantable Lead Model: 4598
Implantable Lead Model: 5076
Implantable Lead Model: 6947
Implantable Pulse Generator Implant Date: 20210930
Lead Channel Impedance Value: 1007 Ohm
Lead Channel Impedance Value: 1121 Ohm
Lead Channel Impedance Value: 1140 Ohm
Lead Channel Impedance Value: 215.064
Lead Channel Impedance Value: 215.064
Lead Channel Impedance Value: 238.518
Lead Channel Impedance Value: 302.831
Lead Channel Impedance Value: 302.831
Lead Channel Impedance Value: 342 Ohm
Lead Channel Impedance Value: 361 Ohm
Lead Channel Impedance Value: 456 Ohm
Lead Channel Impedance Value: 456 Ohm
Lead Channel Impedance Value: 532 Ohm
Lead Channel Impedance Value: 532 Ohm
Lead Channel Impedance Value: 703 Ohm
Lead Channel Impedance Value: 703 Ohm
Lead Channel Impedance Value: 817 Ohm
Lead Channel Impedance Value: 817 Ohm
Lead Channel Pacing Threshold Amplitude: 0.5 V
Lead Channel Pacing Threshold Amplitude: 1.125 V
Lead Channel Pacing Threshold Amplitude: 1.25 V
Lead Channel Pacing Threshold Pulse Width: 0.4 ms
Lead Channel Pacing Threshold Pulse Width: 0.4 ms
Lead Channel Pacing Threshold Pulse Width: 0.8 ms
Lead Channel Sensing Intrinsic Amplitude: 1.125 mV
Lead Channel Sensing Intrinsic Amplitude: 1.125 mV
Lead Channel Sensing Intrinsic Amplitude: 17 mV
Lead Channel Sensing Intrinsic Amplitude: 17 mV
Lead Channel Setting Pacing Amplitude: 2 V
Lead Channel Setting Pacing Amplitude: 2.25 V
Lead Channel Setting Pacing Amplitude: 2.75 V
Lead Channel Setting Pacing Pulse Width: 0.4 ms
Lead Channel Setting Pacing Pulse Width: 0.8 ms
Lead Channel Setting Sensing Sensitivity: 0.45 mV
Zone Setting Status: 755011
Zone Setting Status: 755011

## 2023-06-30 ENCOUNTER — Ambulatory Visit: Payer: No Typology Code available for payment source | Attending: Cardiovascular Disease

## 2023-06-30 DIAGNOSIS — Z95810 Presence of automatic (implantable) cardiac defibrillator: Secondary | ICD-10-CM

## 2023-06-30 DIAGNOSIS — I5022 Chronic systolic (congestive) heart failure: Secondary | ICD-10-CM | POA: Diagnosis not present

## 2023-07-01 ENCOUNTER — Telehealth: Payer: Self-pay

## 2023-07-01 ENCOUNTER — Other Ambulatory Visit: Payer: Self-pay | Admitting: Cardiology

## 2023-07-01 MED ORDER — SPIRONOLACTONE 25 MG PO TABS: ORAL_TABLET | ORAL | 1 refills | Status: DC

## 2023-07-01 NOTE — Progress Notes (Signed)
EPIC Encounter for ICM Monitoring  Patient Name: Tracy Mills is a 78 y.o. female Date: 07/01/2023 Primary Care Physican: Ignatius Specking, MD Primary Cardiologist: Diona Browner Electrophysiologist: Mealor Bi-V Pacing:  90.6%    03/20/2023 Weight: 128 lbs 05/30/2023 Weight:  128 lbs   Since 28-May-2023 Time in AT/AF  <0.1 hr/day (<0.1%) Longest AT/AF 4 minutes        Attempted call to patient and unable to reach.   Transmission results reviewed.          Optivol Thoracic impedance suggesting possible fluid accumulation starting 1/14.   Prescribed: Furosemide 20 mg Take 1-2 tablets (20-40 mg total) by mouth daily. Alternated 20 mg and 40 mg every other day.   Spironolactone 25 mg take 1 tablet daily Jardiance 10 mg take 1 tablet by mouth daily before breakfast   Labs: 02/13/2022 Creatinine 1.14, BUN 7,   Potassium 4.0, Sodium 139, GFR 50 05/22/2021 Creatinine 1.17, BUN 15, Potassium 3.8, Sodium 138, GFR 49 A complete set of results can be found in Results Review.   Recommendations:   Unable to reach.     Follow-up plan: ICM clinic phone appointment on 07/07/2022 (manual) to recheck fluid levels.   91 day device clinic remote transmission 08/26/2023.     EP/Cardiology Office Visits:.  10/23/2023 Sharlene Dory, NP.    07/11/2023 with Dr [name].     Copy of ICM check sent to Dr. [name].  3 month ICM trend: 06/30/2023.    12-14 Month ICM trend:     [name], RN 07/01/2023 4:18 PM

## 2023-07-01 NOTE — Telephone Encounter (Signed)
Remote ICM transmission received.  Attempted call to patient regarding ICM remote transmission and no answer or voice mail set up. 

## 2023-07-04 ENCOUNTER — Encounter: Payer: Medicare HMO | Admitting: Cardiovascular Disease

## 2023-07-08 ENCOUNTER — Ambulatory Visit: Payer: No Typology Code available for payment source | Attending: Cardiovascular Disease

## 2023-07-08 DIAGNOSIS — Z95810 Presence of automatic (implantable) cardiac defibrillator: Secondary | ICD-10-CM

## 2023-07-08 DIAGNOSIS — I5022 Chronic systolic (congestive) heart failure: Secondary | ICD-10-CM

## 2023-07-08 NOTE — Progress Notes (Signed)
Remote ICD transmission.

## 2023-07-08 NOTE — Addendum Note (Signed)
Addended by: Geralyn Flash D on: 07/08/2023 03:39 PM   Modules accepted: Orders

## 2023-07-09 ENCOUNTER — Institutional Professional Consult (permissible substitution): Payer: Medicare HMO | Admitting: Internal Medicine

## 2023-07-10 ENCOUNTER — Other Ambulatory Visit: Payer: Self-pay | Admitting: *Deleted

## 2023-07-10 DIAGNOSIS — I5022 Chronic systolic (congestive) heart failure: Secondary | ICD-10-CM

## 2023-07-10 MED ORDER — CARVEDILOL 3.125 MG PO TABS: 3.1250 mg | ORAL_TABLET | Freq: Two times a day (BID) | ORAL | 1 refills | Status: DC

## 2023-07-10 MED ORDER — FUROSEMIDE 20 MG PO TABS
20.0000 mg | ORAL_TABLET | Freq: Every day | ORAL | 1 refills | Status: DC
Start: 2023-07-10 — End: 2023-10-01

## 2023-07-11 ENCOUNTER — Ambulatory Visit
Payer: No Typology Code available for payment source | Attending: Cardiovascular Disease | Admitting: Cardiovascular Disease

## 2023-07-11 VITALS — BP 110/62 | HR 89 | Ht 63.0 in | Wt 123.6 lb

## 2023-07-11 DIAGNOSIS — I5022 Chronic systolic (congestive) heart failure: Secondary | ICD-10-CM

## 2023-07-11 DIAGNOSIS — I10 Essential (primary) hypertension: Secondary | ICD-10-CM

## 2023-07-11 LAB — CUP PACEART INCLINIC DEVICE CHECK
Battery Remaining Longevity: 59 mo
Battery Voltage: 2.97 V
Brady Statistic AP VP Percent: 69.68 %
Brady Statistic AP VS Percent: 2.26 %
Brady Statistic AS VP Percent: 27.6 %
Brady Statistic AS VS Percent: 0.46 %
Brady Statistic RA Percent Paced: 71.53 %
Brady Statistic RV Percent Paced: 6.69 %
Date Time Interrogation Session: 20250214152500
HighPow Impedance: 47 Ohm
HighPow Impedance: 59 Ohm
Implantable Lead Connection Status: 753985
Implantable Lead Connection Status: 753985
Implantable Lead Connection Status: 753985
Implantable Lead Implant Date: 20110119
Implantable Lead Implant Date: 20110119
Implantable Lead Implant Date: 20150623
Implantable Lead Location: 753858
Implantable Lead Location: 753859
Implantable Lead Location: 753860
Implantable Lead Model: 4598
Implantable Lead Model: 5076
Implantable Lead Model: 6947
Implantable Pulse Generator Implant Date: 20210930
Lead Channel Impedance Value: 1007 Ohm
Lead Channel Impedance Value: 1007 Ohm
Lead Channel Impedance Value: 211.891
Lead Channel Impedance Value: 215.064
Lead Channel Impedance Value: 231.585
Lead Channel Impedance Value: 285.934
Lead Channel Impedance Value: 291.742
Lead Channel Impedance Value: 361 Ohm
Lead Channel Impedance Value: 361 Ohm
Lead Channel Impedance Value: 418 Ohm
Lead Channel Impedance Value: 456 Ohm
Lead Channel Impedance Value: 513 Ohm
Lead Channel Impedance Value: 532 Ohm
Lead Channel Impedance Value: 646 Ohm
Lead Channel Impedance Value: 646 Ohm
Lead Channel Impedance Value: 779 Ohm
Lead Channel Impedance Value: 874 Ohm
Lead Channel Impedance Value: 988 Ohm
Lead Channel Pacing Threshold Amplitude: 0.5 V
Lead Channel Pacing Threshold Amplitude: 1 V
Lead Channel Pacing Threshold Amplitude: 1.125 V
Lead Channel Pacing Threshold Pulse Width: 0.4 ms
Lead Channel Pacing Threshold Pulse Width: 0.4 ms
Lead Channel Pacing Threshold Pulse Width: 0.8 ms
Lead Channel Sensing Intrinsic Amplitude: 0.75 mV
Lead Channel Sensing Intrinsic Amplitude: 0.75 mV
Lead Channel Sensing Intrinsic Amplitude: 14.625 mV
Lead Channel Sensing Intrinsic Amplitude: 16.875 mV
Lead Channel Setting Pacing Amplitude: 2 V
Lead Channel Setting Pacing Amplitude: 2.25 V
Lead Channel Setting Pacing Amplitude: 2.5 V
Lead Channel Setting Pacing Pulse Width: 0.4 ms
Lead Channel Setting Pacing Pulse Width: 0.8 ms
Lead Channel Setting Sensing Sensitivity: 0.45 mV
Zone Setting Status: 755011
Zone Setting Status: 755011

## 2023-07-11 MED ORDER — CARVEDILOL 6.25 MG PO TABS: 6.2500 mg | ORAL_TABLET | Freq: Two times a day (BID) | ORAL | 1 refills | Status: DC

## 2023-07-11 NOTE — Progress Notes (Signed)
   PCP: Ignatius Specking, MD Primary Cardiologist: Dr Diona Browner Primary EP: Dr Avie Arenas is a 78 y.o. female who presents today for routine electrophysiology followup.  Since last being seen in our clinic, the patient reports doing very well.    Today, she denies symptoms of palpitations, chest pain, shortness of breath,  lower extremity edema, dizziness, presyncope, syncope, or ICD shocks.    She did not increase her carvedilol to 6.25 mg as we discussed at the prior visit.  She has been under stress recently as her son-in-law has passed away this past week.  The patient is otherwise without complaint today.       Physical Exam: Vitals:   07/11/23 1326  BP: 110/62  Pulse: 89  SpO2: 100%  Weight: 123 lb 9.6 oz (56.1 kg)  Height: 5\' 3"  (1.6 m)    Gen: Appears comfortable, well-nourished CV: RRR, no dependent edema The device site is normal -- no tenderness, edema, drainage, redness, threatened erosion. Pulm: breathing easily   ICD interrogation- reviewed in detail today,  See PACEART report.   ECG today shows AV dual paced rhythm    Wt Readings from Last 3 Encounters:  07/11/23 123 lb 9.6 oz (56.1 kg)  04/23/23 122 lb 6.4 oz (55.5 kg)  08/27/22 127 lb 3.2 oz (57.7 kg)    Assessment and Plan:  1.  Chronic systolic dysfunction/ CAD/ ischemic CM/ prior VF euvolemic today Stable on an appropriate medical regimen Normal ICD function Effective BiV pacing is 88.1%.  She is having frequent PVCs or rapid AV conduction.  Will increase carvedilol to 6.25 mg.  If she does not tolerate the increased dose from a blood pressure perspective, we will need to switch to metoprolol. See Arita Miss Art report No changes today she is not device dependant today followed in ICM device clinic  2. HTN Stable No change required today  3. Atrial high rate episodes Very brief, just a few minutes total over the past year; doesn't meet threshold to start anticoagulation. Will  increase carvedilol to 6.25 BID  Risks, benefits and potential toxicities for medications prescribed and/or refilled reviewed with patient today.   Return in a year  Maurice Small, MD 07/11/2023 2:11 PM

## 2023-07-11 NOTE — Progress Notes (Signed)
EPIC Encounter for ICM Monitoring  Patient Name: Tracy Mills is a 78 y.o. female Date: 07/11/2023 Primary Care Physican: Ignatius Specking, MD Primary Cardiologist: Diona Browner Electrophysiologist: Mealor Bi-V Pacing:  85.0%    03/20/2023 Weight: 128 lbs 05/30/2023 Weight:  128 lbs   Since 03-Feb-20255 Time in AT/AF  0.0 hr/day (0.0%)         Transmission results reviewed.          Optivol Thoracic impedance suggesting fluid levels returned to normal.   Prescribed: Furosemide 20 mg Take 1-2 tablets (20-40 mg total) by mouth daily. Alternated 20 mg and 40 mg every other day.   Spironolactone 25 mg take 1 tablet daily Jardiance 10 mg take 1 tablet by mouth daily before breakfast   Labs: 02/13/2022 Creatinine 1.14, BUN 7,   Potassium 4.0, Sodium 139, GFR 50 05/22/2021 Creatinine 1.17, BUN 15, Potassium 3.8, Sodium 138, GFR 49 A complete set of results can be found in Results Review.   Recommendations:   Any recommendations will be given at 2/14 OV with Dr [name].   Follow-up plan: ICM clinic phone appointment on 08/04/2023.   91 day device clinic remote transmission 08/26/2023.     EP/Cardiology Office Visits:.  10/23/2023 Sharlene Dory, NP.    07/11/2023 with Dr [name].     Copy of ICM check sent to Dr. [name].   3 month ICM trend: 07/08/2023.    12-14 Month ICM trend:     [name], RN 07/11/2023 1:50 PM

## 2023-07-11 NOTE — Patient Instructions (Signed)
Medication Instructions:   Increase Coreg to 6.25mg  twice a day   Continue all other medications.     Labwork:  none  Testing/Procedures:  none  Follow-Up:  6 months   Any Other Special Instructions Will Be Listed Below (If Applicable).   If you need a refill on your cardiac medications before your next appointment, please call your pharmacy.

## 2023-08-04 ENCOUNTER — Ambulatory Visit: Payer: No Typology Code available for payment source | Attending: Cardiovascular Disease

## 2023-08-04 DIAGNOSIS — Z95810 Presence of automatic (implantable) cardiac defibrillator: Secondary | ICD-10-CM

## 2023-08-04 DIAGNOSIS — I5022 Chronic systolic (congestive) heart failure: Secondary | ICD-10-CM | POA: Diagnosis not present

## 2023-08-05 NOTE — Progress Notes (Addendum)
 Attempted return call and no answer or voice mail set up.

## 2023-08-05 NOTE — Progress Notes (Signed)
 EPIC Encounter for ICM Monitoring  Patient Name: Tracy Mills is a 78 y.o. female Date: 08/05/2023 Primary Care Physican: Ignatius Specking, MD Primary Cardiologist: Diona Browner Electrophysiologist: Mealor Bi-V Pacing: 94.1%    03/20/2023 Weight: 128 lbs 05/30/2023 Weight:  128 lbs   Since 11-Jul-2023 AT/AF  3 Time in AT/AF   <0.1 hr/day (0.3%) Longest AT/AF   66 minutes         Attempted call to patient and unable to reach.    Transmission results reviewed.           Optivol Thoracic impedance suggesting possible fluid accumulation starting 3/2 but trending back close to baseline.   Prescribed: Furosemide 20 mg Take 1-2 tablets (20-40 mg total) by mouth daily. Alternated 20 mg and 40 mg every other day.   Spironolactone 25 mg take 1 tablet daily Jardiance 10 mg take 1 tablet by mouth daily before breakfast   Labs: 02/13/2022 Creatinine 1.14, BUN 7,   Potassium 4.0, Sodium 139, GFR 50 05/22/2021 Creatinine 1.17, BUN 15, Potassium 3.8, Sodium 138, GFR 49 A complete set of results can be found in Results Review.   Recommendations:   Unable to reach.     Follow-up plan: ICM clinic phone appointment on 09/08/2023.   91 day device clinic remote transmission 08/26/2023.     EP/Cardiology Office Visits:.  10/23/2023 Sharlene Dory, NP.    12/26/2023 with Dr [name].     Copy of ICM check sent to Dr. [name].   3 month ICM trend: 08/04/2023.    12-14 Month ICM trend:     [name], RN 08/05/2023 7:23 AM

## 2023-08-11 ENCOUNTER — Telehealth: Payer: Self-pay | Admitting: Cardiology

## 2023-08-11 MED ORDER — EMPAGLIFLOZIN 10 MG PO TABS: 10.0000 mg | ORAL_TABLET | Freq: Every day | ORAL | 1 refills | Status: DC

## 2023-08-11 NOTE — Telephone Encounter (Signed)
 Done

## 2023-08-11 NOTE — Telephone Encounter (Signed)
*  STAT* If patient is at the pharmacy, call can be transferred to refill team.   1. Which medications need to be refilled? (please list name of each medication and dose if known)   empagliflozin (JARDIANCE) 10 MG TABS tablet   2. Which pharmacy/location (including street and city if local pharmacy) is medication to be sent to?  empagliflozin (JARDIANCE) 10 MG TABS tablet   3. Do they need a 30 day or 90 day supply? 90

## 2023-08-12 ENCOUNTER — Encounter

## 2023-08-26 ENCOUNTER — Ambulatory Visit (INDEPENDENT_AMBULATORY_CARE_PROVIDER_SITE_OTHER): Payer: Medicare HMO

## 2023-08-26 DIAGNOSIS — I255 Ischemic cardiomyopathy: Secondary | ICD-10-CM

## 2023-08-27 LAB — CUP PACEART REMOTE DEVICE CHECK
Battery Remaining Longevity: 56 mo
Battery Voltage: 2.98 V
Brady Statistic AP VP Percent: 78.02 %
Brady Statistic AP VS Percent: 1.55 %
Brady Statistic AS VP Percent: 20.14 %
Brady Statistic AS VS Percent: 0.29 %
Brady Statistic RA Percent Paced: 78.89 %
Brady Statistic RV Percent Paced: 8.53 %
Date Time Interrogation Session: 20250401043825
HighPow Impedance: 48 Ohm
HighPow Impedance: 58 Ohm
Implantable Lead Connection Status: 753985
Implantable Lead Connection Status: 753985
Implantable Lead Connection Status: 753985
Implantable Lead Implant Date: 20110119
Implantable Lead Implant Date: 20110119
Implantable Lead Implant Date: 20150623
Implantable Lead Location: 753858
Implantable Lead Location: 753859
Implantable Lead Location: 753860
Implantable Lead Model: 4598
Implantable Lead Model: 5076
Implantable Lead Model: 6947
Implantable Pulse Generator Implant Date: 20210930
Lead Channel Impedance Value: 1026 Ohm
Lead Channel Impedance Value: 1140 Ohm
Lead Channel Impedance Value: 228 Ohm
Lead Channel Impedance Value: 231.42 Ohm
Lead Channel Impedance Value: 256.983
Lead Channel Impedance Value: 306.303
Lead Channel Impedance Value: 312.507
Lead Channel Impedance Value: 342 Ohm
Lead Channel Impedance Value: 399 Ohm
Lead Channel Impedance Value: 399 Ohm
Lead Channel Impedance Value: 456 Ohm
Lead Channel Impedance Value: 532 Ohm
Lead Channel Impedance Value: 551 Ohm
Lead Channel Impedance Value: 665 Ohm
Lead Channel Impedance Value: 722 Ohm
Lead Channel Impedance Value: 779 Ohm
Lead Channel Impedance Value: 817 Ohm
Lead Channel Impedance Value: 988 Ohm
Lead Channel Pacing Threshold Amplitude: 0.5 V
Lead Channel Pacing Threshold Amplitude: 1.125 V
Lead Channel Pacing Threshold Amplitude: 1.25 V
Lead Channel Pacing Threshold Pulse Width: 0.4 ms
Lead Channel Pacing Threshold Pulse Width: 0.4 ms
Lead Channel Pacing Threshold Pulse Width: 0.8 ms
Lead Channel Sensing Intrinsic Amplitude: 1 mV
Lead Channel Sensing Intrinsic Amplitude: 1 mV
Lead Channel Sensing Intrinsic Amplitude: 17.375 mV
Lead Channel Sensing Intrinsic Amplitude: 17.375 mV
Lead Channel Setting Pacing Amplitude: 2 V
Lead Channel Setting Pacing Amplitude: 2.25 V
Lead Channel Setting Pacing Amplitude: 2.5 V
Lead Channel Setting Pacing Pulse Width: 0.4 ms
Lead Channel Setting Pacing Pulse Width: 0.8 ms
Lead Channel Setting Sensing Sensitivity: 0.45 mV
Zone Setting Status: 755011
Zone Setting Status: 755011

## 2023-08-28 ENCOUNTER — Telehealth: Payer: Self-pay

## 2023-08-28 NOTE — Telephone Encounter (Signed)
 Scheduled Remote:  3 AHR episodes on 07/18/23, longest 1 hr 6 min, AF with RVR, V rates > 110 bpm  80%, not on OAC per Epic;  AF appears to be new,  Hx. Of low burden Atrial events per Dr. Morrie Sheldon 07/11/23 OV note.  Previous events only minutes, now we have 1 hour 6 min event. Still low but with CHAD2DS2 VASC score of 5-6 will flag for Mealor's review for anticoagulation initiation consideration.

## 2023-08-29 NOTE — Telephone Encounter (Signed)
 Patient states she was not having any symptoms other than emotional stress.  She lost her son in law in February and she was depressed majority of the month.   Will continue to monitor.  No OAC initiation at this time per

## 2023-09-08 ENCOUNTER — Ambulatory Visit: Attending: Cardiovascular Disease

## 2023-09-08 DIAGNOSIS — Z95810 Presence of automatic (implantable) cardiac defibrillator: Secondary | ICD-10-CM | POA: Diagnosis not present

## 2023-09-08 DIAGNOSIS — I5022 Chronic systolic (congestive) heart failure: Secondary | ICD-10-CM

## 2023-09-12 ENCOUNTER — Telehealth: Payer: Self-pay

## 2023-09-12 NOTE — Progress Notes (Signed)
 EPIC Encounter for ICM Monitoring  Patient Name: Tracy Mills is a 78 y.o. female Date: 09/12/2023 Primary Care Physican: Orlena Bitters, MD Primary Cardiologist: Londa Rival Electrophysiologist: Mealor Bi-V Pacing: 95.6%    03/20/2023 Weight: 128 lbs 05/30/2023 Weight:  128 lbs   Since 26-Aug-2023 Time in AT/AF  0.0 hr/day (0.0%) (no OAC)         Attempted call to patient and unable to reach.    Transmission results reviewed.           Optivol Thoracic impedance suggesting possible fluid accumulation starting 4/6 and returning to baseline 4/14.  Also suggested possible fluid accumulation from 3/3-3/28.   Prescribed: Furosemide  20 mg Take 1-2 tablets (20-40 mg total) by mouth daily. Alternated 20 mg and 40 mg every other day.   Spironolactone  25 mg take 1 tablet daily Jardiance  10 mg take 1 tablet by mouth daily before breakfast   Labs: 02/13/2022 Creatinine 1.14, BUN 7,   Potassium 4.0, Sodium 139, GFR 50 05/22/2021 Creatinine 1.17, BUN 15, Potassium 3.8, Sodium 138, GFR 49 A complete set of results can be found in Results Review.   Recommendations:   Unable to reach.     Follow-up plan: ICM clinic phone appointment on 10/13/2023.   91 day device clinic remote transmission 11/25/2023.     EP/Cardiology Office Visits:.  10/23/2023 Lasalle Pointer, NP.    12/26/2023 with Dr Arlester Ladd.     Copy of ICM check sent to Dr. Arlester Ladd.   3 month ICM trend: 09/08/2023.    12-14 Month ICM trend:     Almyra Jain, RN 09/12/2023 1:26 PM

## 2023-09-12 NOTE — Telephone Encounter (Signed)
 Remote ICM transmission received.  Attempted call to patient regarding ICM remote transmission and recording stated mailbox is not currently accepting messages.

## 2023-10-01 ENCOUNTER — Other Ambulatory Visit: Payer: Self-pay | Admitting: Cardiology

## 2023-10-01 DIAGNOSIS — I5022 Chronic systolic (congestive) heart failure: Secondary | ICD-10-CM

## 2023-10-01 MED ORDER — EMPAGLIFLOZIN 10 MG PO TABS: 10.0000 mg | ORAL_TABLET | Freq: Every day | ORAL | 1 refills | Status: DC

## 2023-10-01 MED ORDER — CARVEDILOL 6.25 MG PO TABS: 6.2500 mg | ORAL_TABLET | Freq: Two times a day (BID) | ORAL | 1 refills | Status: DC

## 2023-10-01 MED ORDER — FUROSEMIDE 20 MG PO TABS: 20.0000 mg | ORAL_TABLET | Freq: Every day | ORAL | 4 refills | Status: DC

## 2023-10-01 MED ORDER — SPIRONOLACTONE 25 MG PO TABS: ORAL_TABLET | ORAL | 1 refills | Status: DC

## 2023-10-01 MED ORDER — ROSUVASTATIN CALCIUM 40 MG PO TABS: 40.0000 mg | ORAL_TABLET | Freq: Every day | ORAL | 2 refills | Status: DC

## 2023-10-01 MED ORDER — ENTRESTO 24-26 MG PO TABS: 1.0000 | ORAL_TABLET | Freq: Two times a day (BID) | ORAL | 2 refills | Status: DC

## 2023-10-09 NOTE — Addendum Note (Signed)
 Addended by: Lott Rouleau A on: 10/09/2023 10:35 AM   Modules accepted: Orders

## 2023-10-09 NOTE — Progress Notes (Signed)
 Remote ICD transmission.

## 2023-10-13 ENCOUNTER — Ambulatory Visit: Attending: Cardiovascular Disease

## 2023-10-13 DIAGNOSIS — I5022 Chronic systolic (congestive) heart failure: Secondary | ICD-10-CM | POA: Diagnosis not present

## 2023-10-13 DIAGNOSIS — Z95810 Presence of automatic (implantable) cardiac defibrillator: Secondary | ICD-10-CM | POA: Diagnosis not present

## 2023-10-14 ENCOUNTER — Telehealth: Payer: Self-pay

## 2023-10-14 NOTE — Progress Notes (Signed)
 EPIC Encounter for ICM Monitoring  Patient Name: Tracy Mills is a 78 y.o. female Date: 10/14/2023 Primary Care Physican: Orlena Bitters, MD Primary Cardiologist: Londa Rival Electrophysiologist: Mealor Bi-V Pacing: 95.1%    03/20/2023 Weight: 128 lbs 05/30/2023 Weight:  128 lbs   Since 08-Sep-2023 Time in AT/AF   <0.1 hr/day (0.3%) (no OAC) Longest AT/AF   2 hours         Attempted call to patient and unable to reach.   Transmission results reviewed.          Optivol Thoracic impedance suggesting possible fluid accumulation starting 5/15 but trending back toward baseline.  Also suggested possible fluid accumulation from 4/16-5/9.  Message sent to device clinic triage 5/20 requesting review of AT/AF.     Prescribed: Furosemide  20 mg Take 1-2 tablets (20-40 mg total) by mouth daily. Alternated 20 mg and 40 mg every other day.   Spironolactone  25 mg take 1 tablet daily Jardiance  10 mg take 1 tablet by mouth daily before breakfast   Labs: 02/13/2022 Creatinine 1.14, BUN 7,   Potassium 4.0, Sodium 139, GFR 50 05/22/2021 Creatinine 1.17, BUN 15, Potassium 3.8, Sodium 138, GFR 49 A complete set of results can be found in Results Review.   Recommendations:   Unable to reach.     Follow-up plan: ICM clinic phone appointment on 10/22/2023 to recheck fluid levels.   91 day device clinic remote transmission 11/25/2023.     EP/Cardiology Office Visits:.  10/23/2023 Lasalle Pointer, NP.    12/26/2023 with Dr Arlester Ladd.     Copy of ICM check sent to Dr. Arlester Ladd.   AT/AF    3 month ICM trend: 10/13/2023.    12-14 Month ICM trend:     Almyra Jain, RN 10/14/2023 9:08 AM

## 2023-10-14 NOTE — Progress Notes (Signed)
 From: Glorianne Largo, RN  Sent: 10/14/2023   9:20 AM EDT  To: Almyra Jain, RN  Subject: RE: AT/AF no OAC                               Dr. Donnella Gain note 4/3 advised if no symptoms will not OAC.   Thanks,  Cleave Curling

## 2023-10-14 NOTE — Telephone Encounter (Signed)
 Remote ICM transmission received.  Attempted call to patient regarding ICM remote transmission and unable to leave message due to voice mail is not activated.

## 2023-10-22 ENCOUNTER — Ambulatory Visit: Attending: Cardiovascular Disease

## 2023-10-22 DIAGNOSIS — Z95810 Presence of automatic (implantable) cardiac defibrillator: Secondary | ICD-10-CM

## 2023-10-22 DIAGNOSIS — I5022 Chronic systolic (congestive) heart failure: Secondary | ICD-10-CM

## 2023-10-23 ENCOUNTER — Ambulatory Visit: Payer: Medicare HMO | Attending: Nurse Practitioner | Admitting: Nurse Practitioner

## 2023-10-23 ENCOUNTER — Encounter: Payer: Self-pay | Admitting: Nurse Practitioner

## 2023-10-23 VITALS — BP 116/62 | HR 96 | Ht 63.0 in | Wt 120.0 lb

## 2023-10-23 DIAGNOSIS — I480 Paroxysmal atrial fibrillation: Secondary | ICD-10-CM

## 2023-10-23 DIAGNOSIS — I4891 Unspecified atrial fibrillation: Secondary | ICD-10-CM

## 2023-10-23 DIAGNOSIS — I5022 Chronic systolic (congestive) heart failure: Secondary | ICD-10-CM | POA: Diagnosis not present

## 2023-10-23 DIAGNOSIS — I340 Nonrheumatic mitral (valve) insufficiency: Secondary | ICD-10-CM | POA: Diagnosis not present

## 2023-10-23 DIAGNOSIS — I2510 Atherosclerotic heart disease of native coronary artery without angina pectoris: Secondary | ICD-10-CM | POA: Diagnosis not present

## 2023-10-23 DIAGNOSIS — I10 Essential (primary) hypertension: Secondary | ICD-10-CM

## 2023-10-23 DIAGNOSIS — I255 Ischemic cardiomyopathy: Secondary | ICD-10-CM | POA: Diagnosis not present

## 2023-10-23 DIAGNOSIS — Z95810 Presence of automatic (implantable) cardiac defibrillator: Secondary | ICD-10-CM

## 2023-10-23 MED ORDER — SPIRONOLACTONE 25 MG PO TABS: 12.5000 mg | ORAL_TABLET | Freq: Every day | ORAL | 1 refills | Status: DC

## 2023-10-23 MED ORDER — CARVEDILOL 6.25 MG PO TABS: 6.2500 mg | ORAL_TABLET | Freq: Two times a day (BID) | ORAL | 1 refills | Status: DC

## 2023-10-23 MED ORDER — APIXABAN 5 MG PO TABS: 5.0000 mg | ORAL_TABLET | Freq: Two times a day (BID) | ORAL | 3 refills | Status: DC

## 2023-10-23 NOTE — Progress Notes (Signed)
 Office Visit    Patient Name: Tracy Mills Date of Encounter: 10/23/2023 PCP:  Orlena Bitters, MD [facility]  Cardiologist:  Teddie Favre, MD  Advanced Practice Provider:  Lasalle Pointer, NP Electrophysiologist:  Efraim Grange, MD   Chief Complaint and HPI    Tracy Mills is a 78 y.o. female with a hx of chronic systolic CHF/ICM, CAD, hypertension, history of V-fib, s/p ICD, MR, LBBB, who presents today for 65-month follow-up.  Followed by EP.  Last seen by Dr. Londa Rival on April 23, 2023.  She did describe a chronic cough with mild production of phlegm, denied any unusual weight loss or hemoptysis.  Quit smoking 1 year ago.  Overall doing well from a cardiac perspective.  A chest CT was arranged to check screening.  This was not completed.  Today she presents for 82-month follow-up appointment.  Tells me her cough has been resolved.  Reviewed her most recent remote device check with her.  New atrial fibrillation was diagnosed with most recent remote device check.  Tells me she has not been diagnosed with atrial fibrillation before. Denies any chest pain, shortness of breath, palpitations, syncope, presyncope, dizziness, orthopnea, PND, swelling or significant weight changes, acute bleeding, or claudication.  ROS: Negative. See HPI.    EKGs/Labs/Other Studies Reviewed:   The following studies were reviewed today:   EKG: AV dual paced rhythm, 73 bpm, no A-fib noted on EKG.  Echo 08/2022:   1. Left ventricular ejection fraction, by estimation, is 30 to 35%. The  left ventricle has moderately decreased function. The left ventricle  demonstrates regional wall motion abnormalities (see scoring  diagram/findings for description). There is mild  asymmetric left ventricular hypertrophy of the basal segment. Left  ventricular diastolic parameters are consistent with Grade I diastolic  dysfunction (impaired relaxation). The average left ventricular  global  longitudinal strain is -13.0 %. The global  longitudinal strain is abnormal.   2. Right ventricular systolic function is normal. The right ventricular  size is normal. There is normal pulmonary artery systolic pressure. The  estimated right ventricular systolic pressure is 29.8 mmHg.   3. The mitral valve is grossly normal. Mild to moderate mitral valve  regurgitation.   4. The aortic valve is tricuspid. Aortic valve regurgitation is not  visualized. Aortic valve sclerosis is present, with no evidence of aortic  valve stenosis. Aortic valve mean gradient measures 4.0 mmHg.   5. The inferior vena cava is normal in size with greater than 50%  respiratory variability, suggesting right atrial pressure of 3 mmHg.   Comparison(s): Prior images reviewed side by side. LVEF 30-35% is somewhat improved in comparison, wall motion abnormalities consistent with ischemic cardiomyopathy.   Echo 08/2021: 1. Mid to distal anteroseptal, anterior, anteropateral,inferoseptal walls  are akinetic. The apex is akinetic. Aaron Aas Left ventricular ejection fraction,  by estimation, is 25 to 30%. The left ventricle has severely decreased  function. The left ventricle  demonstrates regional wall motion abnormalities (see scoring  diagram/findings for description). The left ventricular internal cavity  size was mildly dilated. Left ventricular diastolic parameters are  consistent with Grade I diastolic dysfunction  (impaired relaxation). Elevated left atrial pressure. The average left  ventricular global longitudinal strain is -11.7 %. The global longitudinal  strain is abnormal.   2. Right ventricular systolic function is normal. The right ventricular  size is normal. There is normal pulmonary artery systolic pressure.   3. The mitral valve is abnormal.  Mild mitral valve regurgitation. No  evidence of mitral stenosis.   4. The tricuspid valve is abnormal.   5. The aortic valve was not well visualized. Aortic  valve regurgitation  is not visualized.   6. The inferior vena cava is normal in size with greater than 50%  respiratory variability, suggesting right atrial pressure of 3 mmHg.   Comparison(s): LVEF 25-30%, Moderate MR.  Review of Systems    All other systems reviewed and are otherwise negative except as noted above.  Physical Exam    VS:  BP 116/62   Pulse 96   Ht 5\' 3"  (1.6 m)   Wt 120 lb (54.4 kg)   SpO2 98%   BMI 21.26 kg/m  , BMI Body mass index is 21.26 kg/m.  Wt Readings from Last 3 Encounters:  10/23/23 120 lb (54.4 kg)  07/11/23 123 lb 9.6 oz (56.1 kg)  04/23/23 122 lb 6.4 oz (55.5 kg)     GEN: Well nourished, well developed, in no acute distress. HEENT: normal. Neck: Supple, no JVD, carotid bruits, or masses. Cardiac: S1/S2, RRR, no murmurs, rubs, or gallops. No clubbing, cyanosis, edema.  Radials/PT 2+ and equal bilaterally.  Respiratory:  Respirations regular and unlabored, clear to auscultation bilaterally. GI: Soft, nontender, nondistended. MS: No deformity or atrophy. Skin: Warm and dry, no rash. Neuro:  Strength and sensation are intact. Psych: Normal affect.  Assessment & Plan    New onset A-fib, PAF? Most recent remote device check revealed new onset A-fib.  Longest episode was 1 hour with rates not well-controlled.  Asymptomatic. Increasing carvedilol  as noted below.  CHA2DS2-VASc score 6.  Discussed OAC to reduce stroke risk.  Will initiate Eliquis  5 mg twice daily for stroke prevention and stopping aspirin  as noted below.  Discussed avoiding triggers to A-fib.  Already following up with EP and continue follow-up with EP.  Will check CBC in 1 week.  Chronic systolic CHF/ICM TTE 08/2021 revealed EF 25-30% with EF to 30 to 35% in April 2024. Euvolemic and well compensated on exam. Weight is stable.  Will increase carvedilol  to 6.25 mg twice daily to help improve her heart rate.  Will reduce Aldactone  to 12.5 mg daily.  Continue rest of GDMT. Low sodium  diet, fluid restriction <2L, and daily weights encouraged. Educated to contact our office for weight gain of 2 lbs overnight or 5 lbs in one week.   CAD Stable with no anginal symptoms. No indication for ischemic evaluation.  Stopping aspirin  in order to start Eliquis  as noted above.  No other medication changes besides as outlined above.  Heart healthy diet and regular cardiovascular exercise encouraged.   Hypertension BP stable.  See medication changes outlined above.  Discussed to monitor BP at home at least 2 hours after medications and sitting for 5-10 minutes. Heart healthy diet and regular cardiovascular exercise encouraged.   History of V-fib, s/p ICD, MR Followed by EP. Denies any issues or symptoms. Continue to follow-up with EP.  Echo from April 2024 showed mild to moderate MR.  Plan to update echocardiogram and 1 to 2 years or sooner if clinically indicated.   Disposition: Follow up in 2-3 months with Teddie Favre, MD or APP.  Signed, Lasalle Pointer, NP

## 2023-10-23 NOTE — Progress Notes (Signed)
 EPIC Encounter for ICM Monitoring  Patient Name: Tracy Mills is a 78 y.o. female Date: 10/23/2023 Primary Care Physican: Orlena Bitters, MD Primary Cardiologist: Londa Rival Electrophysiologist: Mealor Bi-V Pacing: 94.9%    03/20/2023 Weight: 128 lbs 05/30/2023 Weight:  128 lbs   Clinical Status  Since 13-Oct-2023 Time in AT/AF   0.0 hr/day (0.0%)(no OAC)          Transmission results reviewed.          Optivol Thoracic impedance suggesting possible fluid accumulation starting 5/15.  Also suggested possible fluid accumulation from 4/16-5/9.     Prescribed: Furosemide  20 mg Take 1-2 tablets (20-40 mg total) by mouth daily. Alternated 20 mg and 40 mg every other day.   Spironolactone  25 mg take 1 tablet daily Jardiance  10 mg take 1 tablet by mouth daily before breakfast   Labs: 02/13/2022 Creatinine 1.14, BUN 7,   Potassium 4.0, Sodium 139, GFR 50 05/22/2021 Creatinine 1.17, BUN 15, Potassium 3.8, Sodium 138, GFR 49 A complete set of results can be found in Results Review.   Recommendations:  Sent to Lasalle Pointer, NP for review at 10/23/2023 OV.      Follow-up plan: ICM clinic phone appointment on 10/28/2023 to recheck fluid levels.   91 day device clinic remote transmission 11/25/2023.     EP/Cardiology Office Visits:.  10/23/2023 Lasalle Pointer, NP.    12/26/2023 with Dr Arlester Ladd.     Copy of ICM check sent to Dr. Arlester Ladd.   3 month ICM trend: 10/23/2023.    12-14 Month ICM trend:     Almyra Jain, RN 10/23/2023 8:19 AM

## 2023-10-23 NOTE — Patient Instructions (Addendum)
 Medication Instructions:  Your physician has recommended you make the following change in your medication:  Please stop Aspirin   Please reduce Aldactone  to 12.5 Mg daily  Please start Eliquis 5 Mg Twice daily  Please Increase Coreg  6.25 Mg Twice daily   Labwork: In 1-2 weeks with [facility]   Testing/Procedures: None   Follow-Up: Your physician recommends that you schedule a follow-up appointment in: 2-3 months   Any Other Special Instructions Will Be Listed Below (If Applicable).  If you need a refill on your cardiac medications before your next appointment, please call your pharmacy.

## 2023-10-28 ENCOUNTER — Telehealth: Payer: Self-pay

## 2023-10-28 ENCOUNTER — Ambulatory Visit: Attending: Cardiovascular Disease

## 2023-10-28 DIAGNOSIS — I5022 Chronic systolic (congestive) heart failure: Secondary | ICD-10-CM

## 2023-10-28 DIAGNOSIS — Z95810 Presence of automatic (implantable) cardiac defibrillator: Secondary | ICD-10-CM

## 2023-10-28 NOTE — Progress Notes (Signed)
 EPIC Encounter for ICM Monitoring  Patient Name: Tracy Mills is a 78 y.o. female Date: 10/28/2023 Primary Care Physican: Orlena Bitters, MD Primary Cardiologist: Londa Rival Electrophysiologist: Mealor Bi-V Pacing: 96.5%    03/20/2023 Weight: 128 lbs 05/30/2023 Weight:  128 lbs 10/23/2023 Office Weight: 120 lbs   Clinical Status  Since 22-Oct-2023 Time in AT/AF   0.0 hr/day (0.0%)(Eliquis )           Attempted call to patient and unable to reach.    Transmission results reviewed.          Optivol Thoracic impedance suggesting fluid levels returned close to baseline.       Prescribed: Furosemide  20 mg Take 1-2 tablets (20-40 mg total) by mouth daily. Alternated 20 mg and 40 mg every other day.   Spironolactone  25 mg take 1 tablet daily Jardiance  10 mg take 1 tablet by mouth daily before breakfast   Labs: 02/13/2022 Creatinine 1.14, BUN 7,   Potassium 4.0, Sodium 139, GFR 50 05/22/2021 Creatinine 1.17, BUN 15, Potassium 3.8, Sodium 138, GFR 49 A complete set of results can be found in Results Review.   Recommendations: Unable to reach.     Follow-up plan: ICM clinic phone appointment on 12/15/2023.   91 day device clinic remote transmission 11/25/2023.     EP/Cardiology Office Visits:.  01/22/2024 with Dr Londa Rival.    12/26/2023 with Dr Arlester Ladd.     Copy of ICM check sent to Dr. Arlester Ladd.   3 month ICM trend: 10/28/2023.    12-14 Month ICM trend:     Tracy Jain, RN 10/28/2023 2:54 PM

## 2023-10-28 NOTE — Telephone Encounter (Signed)
 Remote ICM transmission received.  Attempted call to patient regarding ICM remote transmission and no answer.

## 2023-11-17 ENCOUNTER — Other Ambulatory Visit: Payer: Self-pay

## 2023-11-17 ENCOUNTER — Telehealth: Payer: Self-pay | Admitting: Cardiology

## 2023-11-17 DIAGNOSIS — I5022 Chronic systolic (congestive) heart failure: Secondary | ICD-10-CM

## 2023-11-17 MED ORDER — SPIRONOLACTONE 25 MG PO TABS: 12.5000 mg | ORAL_TABLET | Freq: Every day | ORAL | 3 refills | Status: AC

## 2023-11-17 MED ORDER — EMPAGLIFLOZIN 10 MG PO TABS: 10.0000 mg | ORAL_TABLET | Freq: Every day | ORAL | 3 refills | Status: DC

## 2023-11-17 MED ORDER — FUROSEMIDE 20 MG PO TABS: 20.0000 mg | ORAL_TABLET | Freq: Every day | ORAL | 3 refills | Status: AC

## 2023-11-17 MED ORDER — ENTRESTO 24-26 MG PO TABS: 1.0000 | ORAL_TABLET | Freq: Two times a day (BID) | ORAL | 3 refills | Status: AC

## 2023-11-17 MED ORDER — CARVEDILOL 6.25 MG PO TABS: 6.2500 mg | ORAL_TABLET | Freq: Two times a day (BID) | ORAL | 3 refills | Status: DC

## 2023-11-17 MED ORDER — APIXABAN 5 MG PO TABS: 5.0000 mg | ORAL_TABLET | Freq: Two times a day (BID) | ORAL | 3 refills | Status: DC

## 2023-11-17 MED ORDER — ROSUVASTATIN CALCIUM 40 MG PO TABS: 40.0000 mg | ORAL_TABLET | Freq: Every day | ORAL | 3 refills | Status: AC

## 2023-11-17 NOTE — Telephone Encounter (Signed)
 Prescription refill request for Eliquis  received. Indication:AFIB Last office visit:5/25 Scr:1.27  4/25 Age: 78 Weight:54.4  kg  Prescription refilled

## 2023-11-17 NOTE — Telephone Encounter (Signed)
*  STAT* If patient is at the pharmacy, call can be transferred to refill team.   1. Which medications need to be refilled? (please list name of each medication and dose if known) empagliflozin  (JARDIANCE ) 10 MG TABS tablet   2. Which pharmacy/location (including street and city if local pharmacy) is medication to be sent to? [id number] - [place], [place] - [address] AT [place] [address] [phone]   3. Do they need a 30 day or 90 day supply? 30 waiting on mail order, took last one this morning

## 2023-11-18 MED ORDER — EMPAGLIFLOZIN 10 MG PO TABS
10.0000 mg | ORAL_TABLET | Freq: Every day | ORAL | 0 refills | Status: AC
Start: 2023-11-18 — End: ?

## 2023-11-18 NOTE — Telephone Encounter (Signed)
 Pt's medication was sent to pt's pharmacy as requested. Confirmation received.

## 2023-11-25 ENCOUNTER — Ambulatory Visit: Payer: Medicare HMO

## 2023-11-25 DIAGNOSIS — I5022 Chronic systolic (congestive) heart failure: Secondary | ICD-10-CM

## 2023-11-26 LAB — CUP PACEART REMOTE DEVICE CHECK
Battery Remaining Longevity: 50 mo
Battery Voltage: 2.96 V
Brady Statistic AP VP Percent: 76.75 %
Brady Statistic AP VS Percent: 1.61 %
Brady Statistic AS VP Percent: 21.33 %
Brady Statistic AS VS Percent: 0.31 %
Brady Statistic RA Percent Paced: 77.53 %
Brady Statistic RV Percent Paced: 12.77 %
Date Time Interrogation Session: 20250701211707
HighPow Impedance: 47 Ohm
HighPow Impedance: 59 Ohm
Implantable Lead Connection Status: 753985
Implantable Lead Connection Status: 753985
Implantable Lead Connection Status: 753985
Implantable Lead Implant Date: 20110119
Implantable Lead Implant Date: 20110119
Implantable Lead Implant Date: 20150623
Implantable Lead Location: 753858
Implantable Lead Location: 753859
Implantable Lead Location: 753860
Implantable Lead Model: 4598
Implantable Lead Model: 5076
Implantable Lead Model: 6947
Implantable Pulse Generator Implant Date: 20210930
Lead Channel Impedance Value: 1064 Ohm
Lead Channel Impedance Value: 224.438
Lead Channel Impedance Value: 228 Ohm
Lead Channel Impedance Value: 240.906
Lead Channel Impedance Value: 278.237
Lead Channel Impedance Value: 283.733
Lead Channel Impedance Value: 342 Ohm
Lead Channel Impedance Value: 399 Ohm
Lead Channel Impedance Value: 418 Ohm
Lead Channel Impedance Value: 418 Ohm
Lead Channel Impedance Value: 513 Ohm
Lead Channel Impedance Value: 532 Ohm
Lead Channel Impedance Value: 608 Ohm
Lead Channel Impedance Value: 646 Ohm
Lead Channel Impedance Value: 779 Ohm
Lead Channel Impedance Value: 836 Ohm
Lead Channel Impedance Value: 950 Ohm
Lead Channel Impedance Value: 988 Ohm
Lead Channel Pacing Threshold Amplitude: 0.5 V
Lead Channel Pacing Threshold Amplitude: 1.25 V
Lead Channel Pacing Threshold Amplitude: 1.375 V
Lead Channel Pacing Threshold Pulse Width: 0.4 ms
Lead Channel Pacing Threshold Pulse Width: 0.4 ms
Lead Channel Pacing Threshold Pulse Width: 0.8 ms
Lead Channel Sensing Intrinsic Amplitude: 0.875 mV
Lead Channel Sensing Intrinsic Amplitude: 0.875 mV
Lead Channel Sensing Intrinsic Amplitude: 21.125 mV
Lead Channel Sensing Intrinsic Amplitude: 21.125 mV
Lead Channel Setting Pacing Amplitude: 2 V
Lead Channel Setting Pacing Amplitude: 2.25 V
Lead Channel Setting Pacing Amplitude: 2.75 V
Lead Channel Setting Pacing Pulse Width: 0.4 ms
Lead Channel Setting Pacing Pulse Width: 0.8 ms
Lead Channel Setting Sensing Sensitivity: 0.45 mV
Zone Setting Status: 755011
Zone Setting Status: 755011

## 2023-11-30 ENCOUNTER — Ambulatory Visit: Payer: Self-pay | Admitting: Cardiovascular Disease

## 2023-12-15 ENCOUNTER — Ambulatory Visit: Attending: Cardiovascular Disease

## 2023-12-15 DIAGNOSIS — I5022 Chronic systolic (congestive) heart failure: Secondary | ICD-10-CM

## 2023-12-15 DIAGNOSIS — Z95810 Presence of automatic (implantable) cardiac defibrillator: Secondary | ICD-10-CM | POA: Diagnosis not present

## 2023-12-17 ENCOUNTER — Telehealth: Payer: Self-pay

## 2023-12-17 NOTE — Progress Notes (Signed)
 EPIC Encounter for ICM Monitoring  Patient Name: Tracy Mills is a 78 y.o. female Date: 12/17/2023 Primary Care Physican: Rosamond Leta NOVAK, MD Primary Cardiologist: Debera Electrophysiologist: Mealor Bi-V Pacing: 93.7%    03/20/2023 Weight: 128 lbs 05/30/2023 Weight:  128 lbs 10/23/2023 Office Weight: 120 lbs   Clinical Status  Since 26-Nov-2023 Time in AT/AF   0.0 hr/day (0.0%)(Eliquis )          Attempted call to patient and unable to reach.  Number is out of service.  Msg sent to [facility] office to update phone number at 8/1 OV with Dr Nancey.    Transmission results reviewed.          Optivol Thoracic impedance suggesting possible fluid accumulation starting 7/3.       Prescribed: Furosemide  20 mg Take 1-2 tablets (20-40 mg total) by mouth daily. Alternate 20 mg and 40 mg every other day.   Spironolactone  25 mg take 1 tablet daily   Labs: 02/13/2022 Creatinine 1.14, BUN 7,   Potassium 4.0, Sodium 139, GFR 50 05/22/2021 Creatinine 1.17, BUN 15, Potassium 3.8, Sodium 138, GFR 49 A complete set of results can be found in Results Review.   Recommendations: Unable to reach.     Follow-up plan: ICM clinic phone appointment on 01/02/2024 to recheck fluid levels (pt has OV 8/1 for defib check).   91 day device clinic remote transmission 02/24/2024.     EP/Cardiology Office Visits:.  01/22/2024 with Dr Debera.    12/26/2023 with Dr Nancey.     Copy of ICM check sent to Dr. Nancey.   3 month ICM trend: 12/15/2023.    12-14 Month ICM trend:     [name] Garner, RN 12/17/2023 10:17 AM

## 2023-12-17 NOTE — Telephone Encounter (Signed)
 Remote ICM transmission received.  Attempted call to patient regarding ICM remote transmission and phone is out of service.

## 2023-12-26 ENCOUNTER — Ambulatory Visit
Payer: No Typology Code available for payment source | Attending: Cardiovascular Disease | Admitting: Cardiovascular Disease

## 2023-12-26 ENCOUNTER — Encounter: Payer: Self-pay | Admitting: Cardiovascular Disease

## 2023-12-26 VITALS — BP 98/52 | HR 78 | Ht 63.0 in | Wt 118.0 lb

## 2023-12-26 DIAGNOSIS — I5022 Chronic systolic (congestive) heart failure: Secondary | ICD-10-CM

## 2023-12-26 DIAGNOSIS — I480 Paroxysmal atrial fibrillation: Secondary | ICD-10-CM

## 2023-12-26 LAB — CUP PACEART INCLINIC DEVICE CHECK
Date Time Interrogation Session: 20250801170206
Implantable Lead Connection Status: 753985
Implantable Lead Connection Status: 753985
Implantable Lead Connection Status: 753985
Implantable Lead Implant Date: 20110119
Implantable Lead Implant Date: 20110119
Implantable Lead Implant Date: 20150623
Implantable Lead Location: 753858
Implantable Lead Location: 753859
Implantable Lead Location: 753860
Implantable Lead Model: 4598
Implantable Lead Model: 5076
Implantable Lead Model: 6947
Implantable Pulse Generator Implant Date: 20210930

## 2023-12-26 MED ORDER — METOPROLOL SUCCINATE ER 50 MG PO TB24: 50.0000 mg | ORAL_TABLET | Freq: Every day | ORAL | 6 refills | Status: DC

## 2023-12-26 NOTE — Progress Notes (Signed)
   PCP: Rosamond Leta NOVAK, MD Primary Cardiologist: Dr Debera Primary EP: Dr Kelsie Orlean [name] is a 78 y.o. female who presents today for routine electrophysiology followup.  Since last being seen in our clinic, the patient reports doing very well.    Today, she denies symptoms of palpitations, chest pain, shortness of breath,  lower extremity edema, dizziness, presyncope, syncope, or ICD shocks.    Her primary complaint today is ankle pain, which she has been managing with colchicine.  Device interrogation today shows that she is BiV pacing effectively at about 94%.  Her A-fib burden is about 2%.  She had an episode of A-fib lasting greater than 1 hour, so Eliquis  was started.      Physical Exam: Vitals:   12/26/23 1420  BP: (!) 98/52  Pulse: 78  SpO2: 98%  Weight: 118 lb (53.5 kg)  Height: 5' 3 (1.6 m)     Gen: Appears comfortable, well-nourished CV: RRR, no dependent edema The device site is normal -- no tenderness, edema, drainage, redness, threatened erosion. Pulm: breathing easily   ICD interrogation- reviewed in detail today,  See PACEART report.   ECG today shows AV dual paced rhythm    Wt Readings from Last 3 Encounters:  12/26/23 118 lb (53.5 kg)  10/23/23 120 lb (54.4 kg)  07/11/23 123 lb 9.6 oz (56.1 kg)    Assessment and Plan:  1.  Chronic systolic dysfunction/ CAD/ ischemic CM/ prior VF euvolemic today Stable on an appropriate medical regimen Normal ICD function Effective BiV pacing improved since last visit on higher dose of carvedilol . AF burden is reported at about 2%, but she may be having under sensed A-fib resulting in decreased BiV pacing --will increase atrial sensitivity.  If she continues to have A-fib with RVR, we will need to consider AVJ ablation if there is no room to advance rate controlling medications See Tracy Mills report No changes today she is not device dependant today followed in ICM device clinic  2. HTN Blood pressure  is low today Will switch from carvedilol  to metoprolol   3. Atrial high rate episodes Eliquis  was started after an episode lasting about an hour Rates are not controlled, resulting in decreased CRT CHADS2Vasc is at least 6 Will switch from carvedilol  6.25 to metoprolol  XL 50  Risks, benefits and potential toxicities for medications prescribed and/or refilled reviewed with patient today.   Return in a year  Tracy FORBES Furbish, MD 12/26/2023 2:42 PM

## 2023-12-26 NOTE — Telephone Encounter (Signed)
 Patient returned RN's call.

## 2023-12-26 NOTE — Patient Instructions (Signed)
 Medication Instructions:   Stop Coreg  (Carvedilol )  Begin Toprol  XL 50mg  daily Continue all other medications.     Labwork:  none  Testing/Procedures:  none  Follow-Up:  3 months   Any Other Special Instructions Will Be Listed Below (If Applicable).   If you need a refill on your cardiac medications before your next appointment, please call your pharmacy.

## 2024-01-02 ENCOUNTER — Ambulatory Visit: Attending: Cardiovascular Disease

## 2024-01-02 DIAGNOSIS — Z95810 Presence of automatic (implantable) cardiac defibrillator: Secondary | ICD-10-CM

## 2024-01-02 DIAGNOSIS — I5022 Chronic systolic (congestive) heart failure: Secondary | ICD-10-CM

## 2024-01-02 NOTE — Progress Notes (Signed)
 EPIC Encounter for ICM Monitoring  Patient Name: Tracy Mills is a 78 y.o. female Date: 01/02/2024 Primary Care Physican: Rosamond Leta NOVAK, MD Primary Cardiologist: Debera Electrophysiologist: Mealor Bi-V Pacing: 96.2%    03/20/2023 Weight: 128 lbs 05/30/2023 Weight:  128 lbs 10/23/2023 Office Weight: 120 lbs 12/26/2023 Office Weight: 118 lbs   Clinical Status  Since 26-Dec-2023 Time in AT/AF   0.0 hr/day (0.0%)(Eliquis )          Spoke with patient and heart failure questions reviewed.  Transmission results reviewed.  Pt asymptomatic for fluid accumulation.           Optivol Thoracic impedance suggesting fluid levels returned to normal.       Prescribed: Furosemide  20 mg Take 1-2 tablets (20-40 mg total) by mouth daily. Alternate 20 mg and 40 mg every other day.   Spironolactone  25 mg take 1 tablet daily   Labs: 02/13/2022 Creatinine 1.14, BUN 7,   Potassium 4.0, Sodium 139, GFR 50 05/22/2021 Creatinine 1.17, BUN 15, Potassium 3.8, Sodium 138, GFR 49 A complete set of results can be found in Results Review.   Recommendations:  No changes and encouraged to call if experiencing any fluid symptoms.   Follow-up plan: ICM clinic phone appointment on 01/19/2024.   91 day device clinic remote transmission 02/24/2024.     EP/Cardiology Office Visits:.  01/22/2024 with Dr Debera.    12/26/2023 with Dr Nancey.     Copy of ICM check sent to Dr. Nancey.   3 month ICM trend: 01/02/2024.    12-14 Month ICM trend:     [name] Garner, RN 01/02/2024 4:28 PM

## 2024-01-06 ENCOUNTER — Ambulatory Visit: Payer: Self-pay | Admitting: Cardiovascular Disease

## 2024-01-18 ENCOUNTER — Other Ambulatory Visit: Payer: Self-pay | Admitting: Cardiology

## 2024-01-19 ENCOUNTER — Ambulatory Visit: Attending: Cardiovascular Disease

## 2024-01-19 DIAGNOSIS — Z95810 Presence of automatic (implantable) cardiac defibrillator: Secondary | ICD-10-CM | POA: Diagnosis not present

## 2024-01-19 DIAGNOSIS — I5022 Chronic systolic (congestive) heart failure: Secondary | ICD-10-CM | POA: Diagnosis not present

## 2024-01-20 ENCOUNTER — Telehealth: Payer: Self-pay

## 2024-01-20 NOTE — Telephone Encounter (Signed)
 Remote ICM transmission received.  Attempted call to patient regarding ICM remote transmission and left detailed message per DPR.  Left ICM phone number and advised to return call for any fluid symptoms or questions. Next ICM remote transmission scheduled 02/19/2024.

## 2024-01-20 NOTE — Progress Notes (Signed)
 EPIC Encounter for ICM Monitoring  Patient Name: Tracy Mills is a 78 y.o. female Date: 01/20/2024 Primary Care Physican: Rosamond Leta NOVAK, MD Primary Cardiologist: Debera Electrophysiologist: Mealor Bi-V Pacing: 96.5%    03/20/2023 Weight: 128 lbs 05/30/2023 Weight:  128 lbs 10/23/2023 Office Weight: 120 lbs   Clinical Status  Since 26-Nov-2023 Time in AT/AF   0.0 hr/day (0.0%)(Eliquis )          Attempted call to patient and unable to reach.  Left detailed message per DPR regarding transmission.  Transmission results reviewed.          Optivol Thoracic impedance suggesting normal fluid levels since 7/28.       Prescribed: Furosemide  20 mg Take 1-2 tablets (20-40 mg total) by mouth daily. Alternate 20 mg and 40 mg every other day.   Spironolactone  25 mg take 1 tablet daily   Labs: 02/13/2022 Creatinine 1.14, BUN 7,   Potassium 4.0, Sodium 139, GFR 50 05/22/2021 Creatinine 1.17, BUN 15, Potassium 3.8, Sodium 138, GFR 49 A complete set of results can be found in Results Review.   Recommendations: Unable to reach.  Sent to Dr Debera as [name] for 8/28 OV.   Follow-up plan: ICM clinic phone appointment on 02/19/2024.   91 day device clinic remote transmission 02/24/2024.     EP/Cardiology Office Visits:.  01/22/2024 with Dr Debera.    04/02/2024 with Dr Nancey.     Copy of ICM check sent to Dr. Nancey.   3 month ICM trend: 01/19/2024.    12-14 Month ICM trend:     Tracy GORMAN Garner, RN 01/20/2024 2:14 PM

## 2024-01-22 ENCOUNTER — Encounter: Payer: Self-pay | Admitting: Cardiology

## 2024-01-22 ENCOUNTER — Ambulatory Visit: Attending: Cardiology | Admitting: Cardiology

## 2024-01-22 VITALS — BP 100/62 | HR 66 | Ht 62.5 in | Wt 116.2 lb

## 2024-01-22 DIAGNOSIS — I5020 Unspecified systolic (congestive) heart failure: Secondary | ICD-10-CM | POA: Diagnosis not present

## 2024-01-22 DIAGNOSIS — I25119 Atherosclerotic heart disease of native coronary artery with unspecified angina pectoris: Secondary | ICD-10-CM | POA: Diagnosis not present

## 2024-01-22 DIAGNOSIS — E782 Mixed hyperlipidemia: Secondary | ICD-10-CM | POA: Diagnosis not present

## 2024-01-22 DIAGNOSIS — I480 Paroxysmal atrial fibrillation: Secondary | ICD-10-CM | POA: Diagnosis not present

## 2024-01-22 NOTE — Patient Instructions (Addendum)

## 2024-01-22 NOTE — Progress Notes (Signed)
    Cardiology Office Note  Date: 01/22/2024   ID: AHNYLA MENDEL, DOB Nov 30, 1945, MRN [id number]  History of Present Illness: Tracy Mills is a 78 y.o. female last seen in May by Ms. Miriam NP, I reviewed her note.  Our last visit was in November 2024.  She is here for a follow-up visit.  Reports NYHA class I-II dyspnea, no exertional chest pain, no palpitations or syncope.  She reports no obvious fluid retention.  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 Nancey, I reviewed his most recent note.  Device interrogation in August revealed normal function.  She has less than 1% AT/AF burden.  BiV pacing approximately 94% of the time.  She did not switch from carvedilol  to Toprol -XL, remains clinically stable and tolerating low normal blood pressure.  We discussed her medications.  I also reviewed her interval lab work.  I reviewed her ECG today which shows dual-chamber pacing.  She is due for a follow-up echocardiogram.  Physical Exam: VS:  BP 100/62   Pulse 66   Ht 5' 2.5 (1.588 m)   Wt 116 lb 3.2 oz (52.7 kg)   SpO2 100%   BMI 20.91 kg/m , BMI Body mass index is 20.91 kg/m.  Wt Readings from Last 3 Encounters:  01/22/24 116 lb 3.2 oz (52.7 kg)  12/26/23 118 lb (53.5 kg)  10/23/23 120 lb (54.4 kg)    General: Patient appears comfortable at rest. HEENT: Conjunctiva and lids normal. Neck: Supple, no elevated JVP or carotid bruits. Lungs: Clear to auscultation, nonlabored breathing at rest. Cardiac: Regular rate and rhythm, no S3 or significant systolic murmur. Extremities: No pitting edema.  ECG:  An ECG dated 10/23/2023 was personally reviewed today and demonstrated:  Dual-chamber pacing.  Labwork:  April 2025: BUN 12, creatinine 1.27, GFR 44, potassium 4.7, AST 18, ALT 10, cholesterol 184, triglycerides 63, HDL 96, LDL 76, TSH 2.58 August 2025: Hemoglobin 12.4, platelets 270  Other Studies Reviewed Today:  No interval cardiac testing for review  today.  Assessment and Plan:  1.  HFrEF with ischemic cardiomyopathy, LVEF 30 to 35% by echocardiogram in April 2024.  Reports NYHA class I-II dyspnea, no fluid retention.  Plan to update echocardiogram.  Tolerating low normal blood pressure.  Continue Coreg  6.25 mg twice daily, Jardiance  10 mg daily, Entresto  24/26 mg twice daily, Aldactone  12.5 mg daily, and Lasix .  2.  Paroxysmal atrial fibrillation with CHA2DS2-VASc score of 6.  She has low rhythm burden based on device interrogation.  Continue Eliquis  5 mg twice daily for stroke prophylaxis.   3.  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 Nancey.  No device shocks or syncope.  Recent device check indicates approximately 94% biventricular pacing.   4.  Mitral regurgitation, mild to moderate by echocardiogram in April 2024.  This will be reassessed by echocardiography as well.   5.  CAD status post anterior infarct in 2010 treated with stent intervention of the LAD.  No active angina.  She has as needed nitroglycerin  available.   6.  Primary hypertension.  Typically runs a low normal blood pressure and is asymptomatic.   7.  Mixed hyperlipidemia, LDL 76 with HDL 96 in April.  Continue Crestor  40 mg daily.  Disposition:  Follow up 6 months.  Signed, Jayson JUDITHANN Sierras, M.D., F.A.C.C. [facility]

## 2024-01-27 ENCOUNTER — Encounter: Payer: Self-pay | Admitting: Nurse Practitioner

## 2024-01-27 NOTE — Progress Notes (Signed)
 Next ICM Remote transmission changed to 02/25/2024.

## 2024-02-04 DIAGNOSIS — I10 Essential (primary) hypertension: Secondary | ICD-10-CM | POA: Diagnosis not present

## 2024-02-04 DIAGNOSIS — Z0000 Encounter for general adult medical examination without abnormal findings: Secondary | ICD-10-CM | POA: Diagnosis not present

## 2024-02-04 DIAGNOSIS — Z299 Encounter for prophylactic measures, unspecified: Secondary | ICD-10-CM | POA: Diagnosis not present

## 2024-02-04 DIAGNOSIS — I5022 Chronic systolic (congestive) heart failure: Secondary | ICD-10-CM | POA: Diagnosis not present

## 2024-02-11 ENCOUNTER — Ambulatory Visit: Payer: Self-pay | Admitting: Cardiology

## 2024-02-11 ENCOUNTER — Ambulatory Visit: Attending: Cardiology

## 2024-02-11 DIAGNOSIS — I480 Paroxysmal atrial fibrillation: Secondary | ICD-10-CM

## 2024-02-11 LAB — ECHOCARDIOGRAM COMPLETE
AR max vel: 1.76 cm2
AV Peak grad: 8.9 mmHg
Ao pk vel: 1.49 m/s
Area-P 1/2: 2.63 cm2
Calc EF: 33.5 %
S' Lateral: 3.5 cm
Single Plane A2C EF: 37.2 %
Single Plane A4C EF: 29.6 %

## 2024-02-11 MED ORDER — PERFLUTREN LIPID MICROSPHERE
1.0000 mL | INTRAVENOUS | Status: AC | PRN
  Administered 2024-02-11: 2 mL via INTRAVENOUS

## 2024-02-19 ENCOUNTER — Encounter

## 2024-02-24 ENCOUNTER — Ambulatory Visit: Payer: Medicare HMO

## 2024-02-24 DIAGNOSIS — I480 Paroxysmal atrial fibrillation: Secondary | ICD-10-CM

## 2024-02-25 ENCOUNTER — Ambulatory Visit: Attending: Cardiovascular Disease

## 2024-02-25 DIAGNOSIS — I5022 Chronic systolic (congestive) heart failure: Secondary | ICD-10-CM | POA: Diagnosis not present

## 2024-02-25 DIAGNOSIS — Z95810 Presence of automatic (implantable) cardiac defibrillator: Secondary | ICD-10-CM

## 2024-02-26 ENCOUNTER — Telehealth: Payer: Self-pay

## 2024-02-26 LAB — CUP PACEART REMOTE DEVICE CHECK
Battery Remaining Longevity: 42 mo
Battery Voltage: 2.97 V
Brady Statistic AP VP Percent: 52.15 %
Brady Statistic AP VS Percent: 0.76 %
Brady Statistic AS VP Percent: 46.38 %
Brady Statistic AS VS Percent: 0.7 %
Brady Statistic RA Percent Paced: 51.82 %
Brady Statistic RV Percent Paced: 1.2 %
Date Time Interrogation Session: 20251001193730
HighPow Impedance: 47 Ohm
HighPow Impedance: 60 Ohm
Implantable Lead Connection Status: 753985
Implantable Lead Connection Status: 753985
Implantable Lead Connection Status: 753985
Implantable Lead Implant Date: 20110119
Implantable Lead Implant Date: 20110119
Implantable Lead Implant Date: 20150623
Implantable Lead Location: 753858
Implantable Lead Location: 753859
Implantable Lead Location: 753860
Implantable Lead Model: 4598
Implantable Lead Model: 5076
Implantable Lead Model: 6947
Implantable Pulse Generator Implant Date: 20210930
Lead Channel Impedance Value: 1026 Ohm
Lead Channel Impedance Value: 1083 Ohm
Lead Channel Impedance Value: 201.488
Lead Channel Impedance Value: 211.891
Lead Channel Impedance Value: 238.518
Lead Channel Impedance Value: 276.59 Ohm
Lead Channel Impedance Value: 296.578
Lead Channel Impedance Value: 361 Ohm
Lead Channel Impedance Value: 361 Ohm
Lead Channel Impedance Value: 418 Ohm
Lead Channel Impedance Value: 456 Ohm
Lead Channel Impedance Value: 456 Ohm
Lead Channel Impedance Value: 513 Ohm
Lead Channel Impedance Value: 646 Ohm
Lead Channel Impedance Value: 703 Ohm
Lead Channel Impedance Value: 722 Ohm
Lead Channel Impedance Value: 817 Ohm
Lead Channel Impedance Value: 950 Ohm
Lead Channel Pacing Threshold Amplitude: 0.5 V
Lead Channel Pacing Threshold Amplitude: 1.25 V
Lead Channel Pacing Threshold Amplitude: 1.25 V
Lead Channel Pacing Threshold Pulse Width: 0.4 ms
Lead Channel Pacing Threshold Pulse Width: 0.4 ms
Lead Channel Pacing Threshold Pulse Width: 0.8 ms
Lead Channel Sensing Intrinsic Amplitude: 0.75 mV
Lead Channel Sensing Intrinsic Amplitude: 0.75 mV
Lead Channel Sensing Intrinsic Amplitude: 18.625 mV
Lead Channel Sensing Intrinsic Amplitude: 18.625 mV
Lead Channel Setting Pacing Amplitude: 2 V
Lead Channel Setting Pacing Amplitude: 2.5 V
Lead Channel Setting Pacing Amplitude: 2.75 V
Lead Channel Setting Pacing Pulse Width: 0.4 ms
Lead Channel Setting Pacing Pulse Width: 0.8 ms
Lead Channel Setting Sensing Sensitivity: 0.45 mV
Zone Setting Status: 755011
Zone Setting Status: 755011

## 2024-02-26 NOTE — Telephone Encounter (Signed)
 Remote ICM transmission received.  Attempted call to patient regarding ICM remote transmission.  Left detailed message per DPR with ICM phone number to return call for any questions, concerns or fluid symptoms.

## 2024-02-26 NOTE — Progress Notes (Signed)
 Remote ICD Transmission

## 2024-02-26 NOTE — Progress Notes (Signed)
 EPIC Encounter for ICM Monitoring  Patient Name: Tracy Mills is a 78 y.o. female Date: 02/26/2024 Primary Care Physican: Rosamond Leta NOVAK, MD Primary Cardiologist: Debera Electrophysiologist: Mealor Bi-V Pacing: 96.7%    03/20/2023 Weight: 128 lbs 05/30/2023 Weight:  128 lbs 10/23/2023 Office Weight: 120 lbs 01/22/2024 Office Weight: 116.3 lbs   Clinical Status  Since 19-Jan-2024 Time in AT/AF   <0.1 hr/day (<0.1%) (Eliquis )  Longest AT/AF 23 mintues         Attempted call to patient and unable to reach.  Left detailed message per DPR regarding transmission.  Transmission results reviewed.          Since 01/19/2024 ICM Remote Transmission: Optivol Thoracic impedance suggesting normal fluid levels with the exception of possible fluid accumulation starting 02/14/2024 and back at baseline 02/25/2024.       Prescribed: Furosemide  20 mg Take 1-2 tablets (20-40 mg total) by mouth daily. Alternate 20 mg and 40 mg every other day.   Spironolactone  25 mg take 0.5 tablet (12.5 mg total) by mouth daily   Labs: 02/13/2022 Creatinine 1.14, BUN 7,   Potassium 4.0, Sodium 139, GFR 50 05/22/2021 Creatinine 1.17, BUN 15, Potassium 3.8, Sodium 138, GFR 49 A complete set of results can be found in Results Review.   Recommendations:  Left voice mail with ICM number and encouraged to call if experiencing any fluid symptoms.   Follow-up plan: ICM clinic phone appointment on 04/05/2024.   91 day device clinic remote transmission 05/25/2024.     EP/Cardiology Office Visits:.  07/20/2024 with Dr Debera.    04/02/2024 with Dr Nancey.     Copy of ICM check sent to Dr. Nancey.   Remote monitoring is medically necessary for Heart Failure Management.    90 day Daily Thoracic Impedance ICM trend: 11/26/2023 through 02/25/2024.    12-14 Month Thoracic Impedance ICM trend:     [name] Garner, RN 02/26/2024 7:56 AM

## 2024-03-08 ENCOUNTER — Ambulatory Visit: Payer: Self-pay | Admitting: Cardiovascular Disease

## 2024-04-02 ENCOUNTER — Encounter: Payer: Self-pay | Admitting: Cardiovascular Disease

## 2024-04-02 ENCOUNTER — Ambulatory Visit: Attending: Cardiovascular Disease | Admitting: Cardiovascular Disease

## 2024-04-02 VITALS — BP 102/62 | HR 65 | Ht 63.0 in | Wt 118.2 lb

## 2024-04-02 DIAGNOSIS — I480 Paroxysmal atrial fibrillation: Secondary | ICD-10-CM

## 2024-04-02 NOTE — Patient Instructions (Addendum)

## 2024-04-02 NOTE — Progress Notes (Signed)
   PCP: Rosamond Leta NOVAK, MD Primary Cardiologist: Dr Debera Primary EP: Dr Kelsie Orlean [name] is a 78 y.o. female who presents today for routine electrophysiology followup.  Since last being seen in our clinic, the patient reports doing very well.    Today, she denies symptoms of palpitations, chest pain, shortness of breath,  lower extremity edema, dizziness, presyncope, syncope, or ICD shocks.    Her primary complaint today is ankle pain, which she has been managing with colchicine.  Device interrogation at our last visit showed that she was BiV pacing about 94%.  Her A-fib burden was about 2%.  She had an episode of A-fib lasting greater than 1 hour, so Eliquis  was started.  We increased her atrial sensitivity.  She did not switch from carvedilol  to metoprolol .  Today, her A-fib burden is less than 0.1%. and her V pacing is closer to 97%      Physical Exam: Vitals:   04/02/24 1446  BP: 102/62  Pulse: 65  SpO2: 98%  Weight: 118 lb 3.2 oz (53.6 kg)  Height: 5' 3 (1.6 m)     Gen: Appears comfortable, well-nourished CV: RRR, no dependent edema The device site is normal -- no tenderness, edema, drainage, redness, threatened erosion. Pulm: breathing easily   ICD interrogation- reviewed in detail today,  See PACEART report.   ECG today shows AV dual paced rhythm    Wt Readings from Last 3 Encounters:  04/02/24 118 lb 3.2 oz (53.6 kg)  01/22/24 116 lb 3.2 oz (52.7 kg)  12/26/23 118 lb (53.5 kg)    Assessment and Plan:  1.  Chronic systolic dysfunction/ CAD/ ischemic CM/ prior VF euvolemic today Stable on an appropriate medical regimen Normal ICD function Effective BiV pacing improved since last visit on higher dose of carvedilol . A-fib burden less than 0.1% See Pace Art report No changes today she is not device dependant today followed in ICM device clinic  2. HTN Blood pressure is acceptable today   3. Atrial high rate episodes Eliquis  was started  after an episode lasting about an hour Rates are not controlled, resulting in decreased CRT CHADS2Vasc is at least 6 Anticipate she will have recurrence of A-fib with RVR and may need to switch from carvedilol  to metoprolol  for better rate control and effective CRT.  Risks, benefits and potential toxicities for medications prescribed and/or refilled reviewed with patient today.   Return in a year  Eulas FORBES Furbish, MD 04/02/2024 3:15 PM

## 2024-04-03 LAB — CUP PACEART INCLINIC DEVICE CHECK
Date Time Interrogation Session: 20251107220035
Implantable Lead Connection Status: 753985
Implantable Lead Connection Status: 753985
Implantable Lead Connection Status: 753985
Implantable Lead Implant Date: 20110119
Implantable Lead Implant Date: 20110119
Implantable Lead Implant Date: 20150623
Implantable Lead Location: 753858
Implantable Lead Location: 753859
Implantable Lead Location: 753860
Implantable Lead Model: 4598
Implantable Lead Model: 5076
Implantable Lead Model: 6947
Implantable Pulse Generator Implant Date: 20210930

## 2024-04-05 ENCOUNTER — Ambulatory Visit: Attending: Cardiovascular Disease

## 2024-04-05 DIAGNOSIS — I5022 Chronic systolic (congestive) heart failure: Secondary | ICD-10-CM

## 2024-04-05 DIAGNOSIS — Z95810 Presence of automatic (implantable) cardiac defibrillator: Secondary | ICD-10-CM

## 2024-04-05 NOTE — Progress Notes (Signed)
 EPIC Encounter for ICM Monitoring  Patient Name: Tracy Mills is a 78 y.o. female Date: 04/05/2024 Primary Care Physican: Rosamond Leta NOVAK, MD Primary Cardiologist: Debera Electrophysiologist: Mealor Bi-V Pacing: 97.3%    03/20/2023 Weight: 128 lbs 05/30/2023 Weight:  128 lbs 10/23/2023 Office Weight: 120 lbs 01/22/2024 Office Weight: 116.3 lbs 04/02/2024 Office Weight: 118 lbs   Clinical Status  Since 02-Apr-2024 Time in AT/AF   0.0 hr/day (0.0%) (Eliquis )          Attempted call to patient and unable to reach.  Left detailed message per DPR regarding transmission.  Transmission results reviewed.          Since 02/25/2024 ICM Remote Transmission: Optivol Thoracic impedance suggesting normal fluid levels with the exception of possible fluid accumulation starting 03/22/2024.       Prescribed: Furosemide  20 mg Take 1-2 tablets (20-40 mg total) by mouth daily. Alternate 20 mg and 40 mg every other day.   Spironolactone  25 mg take 0.5 tablet (12.5 mg total) by mouth daily   Labs: 02/13/2022 Creatinine 1.14, BUN 7,   Potassium 4.0, Sodium 139, GFR 50 05/22/2021 Creatinine 1.17, BUN 15, Potassium 3.8, Sodium 138, GFR 49 A complete set of results can be found in Results Review.   Recommendations:  Left voice mail with ICM number and encouraged to call if experiencing any fluid symptoms.  Will send to Dr Debera for review if patient is reached.    Follow-up plan: ICM clinic phone appointment on 04/19/2024 to recheck fluid levels.   91 day device clinic remote transmission 05/25/2024.     EP/Cardiology Office Visits:.  07/20/2024 with Dr Debera.   Recall 03/28/2025 with Dr Nancey.     Copy of ICM check sent to Dr. Nancey.    Remote monitoring is medically necessary for Heart Failure Management.    Daily Thoracic Impedance ICM trend: 01/05/2024 through 04/05/2024.    12-14 Month Thoracic Impedance ICM trend:     [name] Garner, RN 04/05/2024 3:27 PM

## 2024-04-19 ENCOUNTER — Other Ambulatory Visit: Payer: Self-pay | Admitting: Cardiology

## 2024-04-19 ENCOUNTER — Ambulatory Visit

## 2024-04-19 DIAGNOSIS — I480 Paroxysmal atrial fibrillation: Secondary | ICD-10-CM

## 2024-04-20 NOTE — Telephone Encounter (Signed)
 Eliquis  5mg  refill request received. Patient is 78 years old, weight-53.6kg, Crea-1.27 on 09/02/23 via labs from Dr. Rosamond, Diagnosis-Afib, and last seen by Dr. Nancey on 04/02/24. Dose is appropriate based on dosing criteria. Will send in refill to requested pharmacy.

## 2024-04-26 ENCOUNTER — Ambulatory Visit: Payer: Self-pay | Admitting: Cardiovascular Disease

## 2024-05-25 ENCOUNTER — Ambulatory Visit: Payer: Medicare HMO

## 2024-05-25 DIAGNOSIS — I480 Paroxysmal atrial fibrillation: Secondary | ICD-10-CM | POA: Diagnosis not present

## 2024-05-25 LAB — CUP PACEART REMOTE DEVICE CHECK
Battery Remaining Longevity: 42 mo
Battery Voltage: 2.97 V
Brady Statistic AP VP Percent: 74.77 %
Brady Statistic AP VS Percent: 1.21 %
Brady Statistic AS VP Percent: 23.61 %
Brady Statistic AS VS Percent: 0.41 %
Brady Statistic RA Percent Paced: 75.5 %
Brady Statistic RV Percent Paced: 13.17 %
Date Time Interrogation Session: 20251230123428
HighPow Impedance: 45 Ohm
HighPow Impedance: 54 Ohm
Implantable Lead Connection Status: 753985
Implantable Lead Connection Status: 753985
Implantable Lead Connection Status: 753985
Implantable Lead Implant Date: 20110119
Implantable Lead Implant Date: 20110119
Implantable Lead Implant Date: 20150623
Implantable Lead Location: 753858
Implantable Lead Location: 753859
Implantable Lead Location: 753860
Implantable Lead Model: 4598
Implantable Lead Model: 5076
Implantable Lead Model: 6947
Implantable Pulse Generator Implant Date: 20210930
Lead Channel Impedance Value: 193.707
Lead Channel Impedance Value: 201.488
Lead Channel Impedance Value: 231.585
Lead Channel Impedance Value: 253.786
Lead Channel Impedance Value: 267.31 Ohm
Lead Channel Impedance Value: 342 Ohm
Lead Channel Impedance Value: 361 Ohm
Lead Channel Impedance Value: 399 Ohm
Lead Channel Impedance Value: 418 Ohm
Lead Channel Impedance Value: 418 Ohm
Lead Channel Impedance Value: 456 Ohm
Lead Channel Impedance Value: 589 Ohm
Lead Channel Impedance Value: 646 Ohm
Lead Channel Impedance Value: 665 Ohm
Lead Channel Impedance Value: 703 Ohm
Lead Channel Impedance Value: 893 Ohm
Lead Channel Impedance Value: 988 Ohm
Lead Channel Impedance Value: 988 Ohm
Lead Channel Pacing Threshold Amplitude: 0.5 V
Lead Channel Pacing Threshold Amplitude: 1.125 V
Lead Channel Pacing Threshold Amplitude: 1.125 V
Lead Channel Pacing Threshold Pulse Width: 0.4 ms
Lead Channel Pacing Threshold Pulse Width: 0.4 ms
Lead Channel Pacing Threshold Pulse Width: 0.8 ms
Lead Channel Sensing Intrinsic Amplitude: 0.25 mV
Lead Channel Sensing Intrinsic Amplitude: 0.25 mV
Lead Channel Sensing Intrinsic Amplitude: 16.625 mV
Lead Channel Sensing Intrinsic Amplitude: 16.625 mV
Lead Channel Setting Pacing Amplitude: 2 V
Lead Channel Setting Pacing Amplitude: 2.25 V
Lead Channel Setting Pacing Amplitude: 2.5 V
Lead Channel Setting Pacing Pulse Width: 0.4 ms
Lead Channel Setting Pacing Pulse Width: 0.8 ms
Lead Channel Setting Sensing Sensitivity: 0.45 mV
Zone Setting Status: 755011
Zone Setting Status: 755011

## 2024-05-26 ENCOUNTER — Ambulatory Visit

## 2024-05-26 ENCOUNTER — Telehealth: Payer: Self-pay

## 2024-05-26 DIAGNOSIS — I5022 Chronic systolic (congestive) heart failure: Secondary | ICD-10-CM | POA: Diagnosis not present

## 2024-05-26 DIAGNOSIS — Z95810 Presence of automatic (implantable) cardiac defibrillator: Secondary | ICD-10-CM | POA: Diagnosis not present

## 2024-05-26 NOTE — Telephone Encounter (Signed)
 Remote ICM transmission received.  Attempted call to patient regarding ICM remote transmission.  Left detailed message per DPR with ICM phone number to return call for any questions, concerns or fluid symptoms.

## 2024-05-26 NOTE — Progress Notes (Signed)
 EPIC Encounter for ICM Monitoring  Patient Name: Tracy Mills is a 78 y.o. female Date: 05/26/2024 Primary Care Physican: Rosamond Leta NOVAK, MD Primary Cardiologist: Debera Electrophysiologist: Mealor Bi-V Pacing: 97.4%    03/20/2023 Weight: 128 lbs 05/30/2023 Weight:  128 lbs 10/23/2023 Office Weight: 120 lbs 01/22/2024 Office Weight: 116.3 lbs 04/02/2024 Office Weight: 118 lbs   Clinical Status  Since 05-Apr-2024 Time in AT/AF   <0.1 hr/day (<0.1%) (Eliquis )  Longest AT/AF  4 minutes         Attempted call to patient and unable to reach.  Left detailed message per DPR regarding transmission.  Transmission results reviewed.          Since 04/05/2024 ICM Remote Transmission: Optivol Thoracic impedance suggesting normal fluid levels with the exception of possible fluid accumulation starting 03/22/2024.       Prescribed: Furosemide  20 mg Take 1-2 tablets (20-40 mg total) by mouth daily. Alternate 20 mg and 40 mg every other day.   Spironolactone  25 mg take 0.5 tablet (12.5 mg total) by mouth daily   Labs: 02/13/2022 Creatinine 1.14, BUN 7,   Potassium 4.0, Sodium 139, GFR 50 05/22/2021 Creatinine 1.17, BUN 15, Potassium 3.8, Sodium 138, GFR 49 A complete set of results can be found in Results Review.   Recommendations:  Left voice mail with ICM number and encouraged to call if experiencing any fluid symptoms.     Follow-up plan: ICM clinic phone appointment on 06/28/2024.   91 day device clinic remote transmission 08/24/2024.     EP/Cardiology Office Visits:.  07/20/2024 with Dr Debera.   Recall 03/28/2025 with Dr Nancey.     Copy of ICM check sent to Dr. Nancey.     Remote monitoring is medically necessary for Heart Failure Management.    Daily Thoracic Impedance ICM trend: 02/24/2024 through 05/25/2024.    12-14 Month Thoracic Impedance ICM trend:     [name] Garner, RN 05/26/2024 12:58 PM

## 2024-05-31 NOTE — Progress Notes (Signed)
 Remote ICD Transmission

## 2024-06-05 ENCOUNTER — Ambulatory Visit: Payer: Self-pay | Admitting: Cardiovascular Disease

## 2024-06-24 NOTE — Progress Notes (Signed)
 31 day ICM Remote transmission canceled due to [facility] clinic is on hold until further notice.  91 day remote monitoring will continue per protocol.

## 2024-06-28 ENCOUNTER — Ambulatory Visit

## 2024-07-20 ENCOUNTER — Ambulatory Visit: Admitting: Cardiology
